# Patient Record
Sex: Female | Born: 1955
Health system: Southern US, Community
[De-identification: ages and names within clinical notes are randomized; demographics above are authoritative.]

## PROBLEM LIST (undated history)

## (undated) DIAGNOSIS — E785 Hyperlipidemia, unspecified: Secondary | ICD-10-CM

## (undated) HISTORY — DX: Hyperlipidemia, unspecified: E78.5

---

## 2005-03-22 ENCOUNTER — Ambulatory Visit: Payer: Self-pay

## 2005-10-16 ENCOUNTER — Ambulatory Visit: Payer: Self-pay | Admitting: Gastroenterology

## 2006-11-29 ENCOUNTER — Ambulatory Visit: Payer: Self-pay

## 2008-01-16 ENCOUNTER — Encounter: Payer: Self-pay | Admitting: Internal Medicine

## 2008-01-17 ENCOUNTER — Encounter: Payer: Self-pay | Admitting: Internal Medicine

## 2008-05-28 ENCOUNTER — Ambulatory Visit: Payer: Self-pay

## 2008-05-30 ENCOUNTER — Emergency Department: Payer: Self-pay | Admitting: Emergency Medicine

## 2009-07-14 ENCOUNTER — Ambulatory Visit: Payer: Self-pay | Admitting: Obstetrics and Gynecology

## 2010-07-26 ENCOUNTER — Ambulatory Visit: Payer: Self-pay | Admitting: Obstetrics and Gynecology

## 2010-09-28 ENCOUNTER — Ambulatory Visit: Payer: Self-pay | Admitting: Obstetrics and Gynecology

## 2011-08-01 ENCOUNTER — Ambulatory Visit: Payer: Self-pay | Admitting: Obstetrics and Gynecology

## 2012-08-01 ENCOUNTER — Ambulatory Visit: Payer: Self-pay | Admitting: Obstetrics and Gynecology

## 2012-09-13 ENCOUNTER — Ambulatory Visit: Payer: Self-pay | Admitting: Surgery

## 2013-07-23 ENCOUNTER — Ambulatory Visit: Payer: Self-pay | Admitting: Physician Assistant

## 2014-05-21 ENCOUNTER — Emergency Department: Payer: Self-pay | Admitting: Emergency Medicine

## 2014-11-24 ENCOUNTER — Ambulatory Visit: Payer: Self-pay | Admitting: Obstetrics and Gynecology

## 2014-11-25 ENCOUNTER — Ambulatory Visit: Payer: Self-pay | Admitting: Obstetrics and Gynecology

## 2015-01-04 ENCOUNTER — Encounter: Payer: Self-pay | Admitting: Family Medicine

## 2015-01-04 DIAGNOSIS — M7711 Lateral epicondylitis, right elbow: Secondary | ICD-10-CM

## 2015-01-04 DIAGNOSIS — M810 Age-related osteoporosis without current pathological fracture: Secondary | ICD-10-CM

## 2015-01-04 DIAGNOSIS — M2550 Pain in unspecified joint: Secondary | ICD-10-CM

## 2015-01-04 DIAGNOSIS — K219 Gastro-esophageal reflux disease without esophagitis: Secondary | ICD-10-CM

## 2015-01-04 DIAGNOSIS — B0229 Other postherpetic nervous system involvement: Secondary | ICD-10-CM

## 2015-01-04 DIAGNOSIS — M818 Other osteoporosis without current pathological fracture: Secondary | ICD-10-CM | POA: Insufficient documentation

## 2015-01-04 HISTORY — DX: Lateral epicondylitis, right elbow: M77.11

## 2015-01-08 NOTE — Op Note (Signed)
PATIENT NAME:  Christine Ray, Christine Ray MR#:  [id number] DATE OF BIRTH:  1956-04-13  DATE OF PROCEDURE:  09/13/2012  PREOPERATIVE DIAGNOSIS: Pilonidal cyst.   POSTOPERATIVE DIAGNOSIS: Pilonidal cyst.   PROCEDURE PERFORMED: Excision of pilonidal cyst.   SURGEON: Loreli Dollar, M.D.   ANESTHESIA: General.   INDICATIONS: This 59 year old female has a history of localized infection and drainage, left buttock near the gluteal fold. It has happened numerous times dating back many years.  It has been lanced and packed at least two times and did have some recent spontaneous drainage and identified palpable inflammatory mass just to the left of the gluteal crease and also identified a small pore in the skin adjacent to it in the midline and excision was recommended for definitive treatment.   DESCRIPTION OF PROCEDURE: The patient was brought into the Operating Room and placed under general endotracheal anesthesia and then rolled over onto the operating table in the prone position. The site of the cyst was prepared with Betadine solution and draped in a sterile manner.   A malleable probe was inserted into the midline pore and this went in just about 4 mm.  Next, a transversely oriented incision was made surrounding this pore and extending out to the palpable site of the cyst.  The incision was approximately 3 cm in length and was carried down through subcutaneous tissues, but did remove an ellipse of skin overlying the cyst extending over to the pore and dissection was carried down through subcutaneous tissues using electrocautery for hemostasis and the cyst was completely excised and submitted in formalin for routine pathology.         The wound was inspected. Several small bleeding points were cauterized. There was no remaining cyst identified. Next, the wound was infiltrated with 0.5% Sensorcaine with epinephrine. The wound was closed with interrupted 3-0 Monocryl inverted interrupted subcuticular  sutures and this did approximate the skin edges. Next, dressings were applied with Benzoin and paper tape. The patient tolerated the procedure satisfactorily and was then prepared for transfer to the recovery room.   ____________________________ Lenna Sciara. Rochel Brome, MD jws:eg D: 09/13/2012 13:07:55 ET T: 09/13/2012 22:35:42 ET JOB#: [id number]  cc: Loreli Dollar, MD, <Dictator> Loreli Dollar MD ELECTRONICALLY SIGNED 09/19/2012 16:07

## 2015-10-14 ENCOUNTER — Ambulatory Visit (INDEPENDENT_AMBULATORY_CARE_PROVIDER_SITE_OTHER): Payer: Managed Care, Other (non HMO) | Admitting: Family Medicine

## 2015-10-14 ENCOUNTER — Encounter: Payer: Self-pay | Admitting: Family Medicine

## 2015-10-14 VITALS — BP 116/78 | HR 98 | Temp 98.4°F | Resp 16 | Ht 70.0 in | Wt 203.8 lb

## 2015-10-14 DIAGNOSIS — R109 Unspecified abdominal pain: Secondary | ICD-10-CM

## 2015-10-14 DIAGNOSIS — N3090 Cystitis, unspecified without hematuria: Secondary | ICD-10-CM | POA: Diagnosis not present

## 2015-10-14 LAB — POCT URINALYSIS DIPSTICK
BILIRUBIN UA: NEGATIVE
Glucose, UA: NEGATIVE
KETONES UA: NEGATIVE
Nitrite, UA: POSITIVE
PH UA: 6
PROTEIN UA: NEGATIVE
SPEC GRAV UA: 1.01
Urobilinogen, UA: 0.2

## 2015-10-14 MED ORDER — SULFAMETHOXAZOLE-TRIMETHOPRIM 800-160 MG PO TABS: 1.0000 | ORAL_TABLET | Freq: Two times a day (BID) | ORAL | Status: DC

## 2015-10-14 NOTE — Progress Notes (Signed)
Date:  10/14/2015   Name:  Christine Ray   DOB:  Apr 04, 1956   MRN:  [id number]  PCP:  No primary care provider on file.    Chief Complaint: Abdominal Pain   History of Present Illness:  This is a 60 y.o. female with one week history of RLQ abdominal pain, worse since yesterday, dull/achy. Some urinary frequency. Still has appendix.  Review of Systems:  Review of Systems  Constitutional: Negative for fever and chills.  Gastrointestinal: Negative for nausea, vomiting, diarrhea and constipation.  Genitourinary: Negative for dysuria and flank pain.    Patient Active Problem List   Diagnosis Date Noted  . GERD (gastroesophageal reflux disease) 01/04/2015  . Osteoporosis 01/04/2015  . Postherpetic neuralgia 01/04/2015  . Diffuse arthralgia 01/04/2015  . Lateral epicondylitis of right elbow 01/04/2015    Prior to Admission medications   Medication Sig Start Date End Date Taking? Authorizing Provider  sulfamethoxazole-trimethoprim (BACTRIM DS,SEPTRA DS) 800-160 MG tablet Take 1 tablet by mouth 2 (two) times daily. 10/14/15   Adline Potter, MD    Allergies  Allergen Reactions  . Hydrocodone Other (See Comments)  . Oxycodone     Rash  . Oxycodone Hcl Rash    History reviewed. No pertinent past surgical history.  Social History  Substance Use Topics  . Smoking status: Former Smoker    Types: Cigarettes    Quit date: 10/14/1975  . Smokeless tobacco: None  . Alcohol Use: No    Family History  Problem Relation Age of Onset  . Prostate cancer Father     Medication list has been reviewed and updated.  Physical Examination: BP 116/78 mmHg  Pulse 98  Temp(Src) 98.4 F (36.9 C)  Resp 16  Ht 5\' 10"  (1.778 m)  Wt 203 lb 12.8 oz (92.443 kg)  BMI 29.24 kg/m2  Physical Exam  Constitutional: She appears well-developed and well-nourished. No distress.  Abdominal: Soft. Bowel sounds are normal. She exhibits no distension and no mass. There is no rebound and no guarding.   Mild RLQ and suprapubic tenderness  Neurological: She is alert.  Skin: Skin is warm and dry. She is not diaphoretic.  Psychiatric: She has a normal mood and affect. Her behavior is normal.  Nursing note and vitals reviewed.   Assessment and Plan:  1. Cystitis UA shows large blood, mod leuk, pos nitrite, Bactrim DS bid x 5d, cx sent   2. Abdominal pain, unspecified abdominal location Doubt appendicitis but consider ED eval if sxs worsen or fail to improve in 24 hrs - POCT urinalysis dipstick - CULTURE, URINE COMPREHENSIVE  Return if symptoms worsen or fail to improve.  Satira Anis. [facility]  10/14/2015

## 2015-10-16 LAB — CULTURE, URINE COMPREHENSIVE

## 2015-10-16 LAB — PLEASE NOTE

## 2016-04-19 ENCOUNTER — Other Ambulatory Visit: Payer: Self-pay

## 2016-04-20 ENCOUNTER — Ambulatory Visit: Payer: Managed Care, Other (non HMO) | Admitting: Family Medicine

## 2016-04-26 ENCOUNTER — Ambulatory Visit (INDEPENDENT_AMBULATORY_CARE_PROVIDER_SITE_OTHER): Payer: Managed Care, Other (non HMO) | Admitting: Internal Medicine

## 2016-04-26 ENCOUNTER — Encounter: Payer: Self-pay | Admitting: Internal Medicine

## 2016-04-26 VITALS — BP 122/90 | HR 71 | Temp 98.2°F | Resp 16 | Ht 70.0 in | Wt 194.8 lb

## 2016-04-26 DIAGNOSIS — E785 Hyperlipidemia, unspecified: Secondary | ICD-10-CM

## 2016-04-26 DIAGNOSIS — H8113 Benign paroxysmal vertigo, bilateral: Secondary | ICD-10-CM | POA: Diagnosis not present

## 2016-04-26 DIAGNOSIS — R350 Frequency of micturition: Secondary | ICD-10-CM | POA: Diagnosis not present

## 2016-04-26 DIAGNOSIS — E559 Vitamin D deficiency, unspecified: Secondary | ICD-10-CM

## 2016-04-26 DIAGNOSIS — H8110 Benign paroxysmal vertigo, unspecified ear: Secondary | ICD-10-CM | POA: Insufficient documentation

## 2016-04-26 DIAGNOSIS — I8390 Asymptomatic varicose veins of unspecified lower extremity: Secondary | ICD-10-CM

## 2016-04-26 DIAGNOSIS — E782 Mixed hyperlipidemia: Secondary | ICD-10-CM | POA: Insufficient documentation

## 2016-04-26 DIAGNOSIS — I868 Varicose veins of other specified sites: Secondary | ICD-10-CM

## 2016-04-26 DIAGNOSIS — I8393 Asymptomatic varicose veins of bilateral lower extremities: Secondary | ICD-10-CM | POA: Insufficient documentation

## 2016-04-26 LAB — POC URINALYSIS WITH MICROSCOPIC (NON AUTO)MANUAL RESULT
BACTERIA UA: 0
Bilirubin, UA: NEGATIVE
CRYSTALS: 0
EPITHELIAL CELLS, URINE PER MICROSCOPY: 0
Glucose, UA: NEGATIVE
Ketones, UA: NEGATIVE
Leukocytes, UA: NEGATIVE
MUCUS UA: 0
Nitrite, UA: NEGATIVE
PH UA: 5
RBC: 0 M/uL — AB (ref 4.04–5.48)
Spec Grav, UA: 1.02
WBC CASTS UA: 0

## 2016-04-26 NOTE — Patient Instructions (Signed)

## 2016-04-26 NOTE — Progress Notes (Signed)
Date:  04/26/2016   Name:  Christine Ray   DOB:  [birth year]   MRN:  [id number]   Chief Complaint: Abdominal Pain Glori Luis. urination and pain radiates to lower back on L side only. Denies Dysuria. Symptoms 1 week. ); Ear Drainage (2 weeks-Not sure if this was vertigo issue or from new med from Gyn for cholesterol. ); Foot Swelling (Left ankle selling x 2 weeks. ); Blurred Vision (Here and there not sure if this is also from cholesterol meds. ); and Hyperlipidemia (Wants Cholesterol Labs OBGYN Wrote for Atorvastatin)  Ear Drainage   There is pain in both ears. This is a new problem. The current episode started 1 to 4 weeks ago. Associated symptoms include ear discharge. Pertinent negatives include no hearing loss.  Urinary Frequency   This is a new problem. The problem occurs every urination. The problem has been unchanged. The patient is experiencing no pain. There has been no fever. Associated symptoms include frequency and urgency. Pertinent negatives include no hematuria. She has tried nothing for the symptoms.  Ankle Pain   The incident occurred more than 1 week ago. There was no injury mechanism. The pain is present in the left ankle (swelling). The patient is experiencing no pain.  Dizziness  This is a recurrent problem. The current episode started more than 1 year ago. The problem occurs rarely. The problem has been unchanged. Associated symptoms include arthralgias and joint swelling (ankle). Pertinent negatives include no chest pain. Nothing aggravates the symptoms. Treatments tried: meclizine.   Patient was started on Lipitor a month ago by her gynecologist. She took it for 1-2 weeks and felt that she was having side effects and discontinued it. Review review of labs reveal that she's been able to improve her cholesterol diet alone in the past and her 10 year risk is only 4%. She would like to stop medication and try control with diet.  Review of Systems  HENT: Positive for ear  discharge. Negative for ear pain and hearing loss.   Eyes: Positive for visual disturbance.  Respiratory: Negative for chest tightness, shortness of breath and wheezing.   Cardiovascular: Negative for chest pain, palpitations and leg swelling.  Genitourinary: Positive for frequency and urgency. Negative for difficulty urinating, dysuria and hematuria.  Musculoskeletal: Positive for arthralgias and joint swelling (ankle).  Neurological: Positive for dizziness.    Patient Active Problem List   Diagnosis Date Noted  . Hyperlipidemia 04/26/2016  . Vitamin D deficiency 04/26/2016  . GERD (gastroesophageal reflux disease) 01/04/2015  . Osteoporosis 01/04/2015  . Postherpetic neuralgia 01/04/2015  . Diffuse arthralgia 01/04/2015  . Lateral epicondylitis of right elbow 01/04/2015    Prior to Admission medications   Medication Sig Start Date End Date Taking? Authorizing Provider  Ascorbic Acid (VITAMIN C) 500 MG CAPS Take 1,000 mg by mouth.   Yes Historical Provider, MD  atorvastatin (LIPITOR) 10 MG tablet Take by mouth. 04/06/16 04/06/17 Yes Historical Provider, MD  Vitamin D, Ergocalciferol, (DRISDOL) 50000 units CAPS capsule Take by mouth. 10/15/15  Yes Historical Provider, MD    Allergies  Allergen Reactions  . Hydrocodone Other (See Comments)  . Oxycodone     Rash  . Oxycodone Hcl Rash    History reviewed. No pertinent surgical history.  Social History  Substance Use Topics  . Smoking status: Former Smoker    Types: Cigarettes    Quit date: 10/14/1975  . Smokeless tobacco: Never Used  . Alcohol use No  Medication list has been reviewed and updated.   Physical Exam  Constitutional: She is oriented to person, place, and time. She appears well-developed. No distress.  HENT:  Head: Normocephalic and atraumatic.  Right Ear: Tympanic membrane and ear canal normal.  Left Ear: Tympanic membrane and ear canal normal.  Nose: Right sinus exhibits no maxillary sinus tenderness  and no frontal sinus tenderness. Left sinus exhibits no maxillary sinus tenderness and no frontal sinus tenderness.  Mouth/Throat: No posterior oropharyngeal erythema.  Neck: Normal range of motion. Neck supple.  Cardiovascular: Normal rate, regular rhythm and normal heart sounds.   Prominent varicose veins of both lower legs and ankles  Pulmonary/Chest: Effort normal and breath sounds normal. No respiratory distress.  Abdominal: Soft. Bowel sounds are normal. There is no hepatosplenomegaly. There is tenderness in the left lower quadrant. There is no rebound and no CVA tenderness.  Musculoskeletal: Normal range of motion.  Lymphadenopathy:    She has no cervical adenopathy.  Neurological: She is alert and oriented to person, place, and time. She has normal reflexes.  Skin: Skin is warm and dry. No rash noted.  Psychiatric: She has a normal mood and affect. Her speech is normal and behavior is normal. Thought content normal.  Nursing note and vitals reviewed.   BP 122/90 (BP Location: Left Arm, Patient Position: Sitting, Cuff Size: Normal)   Pulse 71   Temp 98.2 F (36.8 C) (Oral)   Resp 16   Ht 5\' 10"  (1.778 m)   Wt 194 lb 12.8 oz (88.4 kg)   SpO2 99%   BMI 27.95 kg/m   Assessment and Plan: 1. Urinary frequency UA negative - if sx persist return - POC urinalysis w microscopic (non auto)  2. Hyperlipidemia Stop lipitor and begin Mediterranean Diet  3. Varicose veins Can consult Vein clinics for treatment if desired  4. BPPV (benign paroxysmal positional vertigo), bilateral Continue meclizine PRN - consider ENT evaluation if persistent  5. Vitamin D deficiency supplemented   Halina Maidens, MD [facility]  04/26/2016

## 2016-05-24 ENCOUNTER — Other Ambulatory Visit: Payer: Self-pay | Admitting: Obstetrics and Gynecology

## 2016-05-24 DIAGNOSIS — Z1231 Encounter for screening mammogram for malignant neoplasm of breast: Secondary | ICD-10-CM

## 2016-06-14 ENCOUNTER — Ambulatory Visit
Admission: RE | Admit: 2016-06-14 | Discharge: 2016-06-14 | Disposition: A | Discharge status: Home / Self Care | Payer: Managed Care, Other (non HMO) | Source: Ambulatory Visit | Attending: Obstetrics and Gynecology | Admitting: Obstetrics and Gynecology

## 2016-06-14 DIAGNOSIS — Z1231 Encounter for screening mammogram for malignant neoplasm of breast: Secondary | ICD-10-CM | POA: Diagnosis present

## 2016-06-14 DIAGNOSIS — R928 Other abnormal and inconclusive findings on diagnostic imaging of breast: Secondary | ICD-10-CM | POA: Insufficient documentation

## 2016-06-16 ENCOUNTER — Other Ambulatory Visit: Payer: Self-pay | Admitting: Obstetrics and Gynecology

## 2016-06-16 DIAGNOSIS — R928 Other abnormal and inconclusive findings on diagnostic imaging of breast: Secondary | ICD-10-CM

## 2016-06-16 DIAGNOSIS — N6489 Other specified disorders of breast: Secondary | ICD-10-CM

## 2016-06-30 ENCOUNTER — Ambulatory Visit
Admission: RE | Admit: 2016-06-30 | Discharge: 2016-06-30 | Disposition: A | Discharge status: Home / Self Care | Payer: Managed Care, Other (non HMO) | Source: Ambulatory Visit | Attending: Obstetrics and Gynecology | Admitting: Obstetrics and Gynecology

## 2016-06-30 DIAGNOSIS — N6489 Other specified disorders of breast: Secondary | ICD-10-CM

## 2016-06-30 DIAGNOSIS — R928 Other abnormal and inconclusive findings on diagnostic imaging of breast: Secondary | ICD-10-CM

## 2016-10-25 ENCOUNTER — Other Ambulatory Visit: Payer: Self-pay | Admitting: Internal Medicine

## 2016-10-27 ENCOUNTER — Ambulatory Visit (INDEPENDENT_AMBULATORY_CARE_PROVIDER_SITE_OTHER): Payer: Commercial Managed Care - PPO | Admitting: Internal Medicine

## 2016-10-27 ENCOUNTER — Encounter: Payer: Self-pay | Admitting: Internal Medicine

## 2016-10-27 VITALS — BP 124/78 | HR 80 | Temp 98.1°F | Resp 16 | Wt 187.0 lb

## 2016-10-27 DIAGNOSIS — E782 Mixed hyperlipidemia: Secondary | ICD-10-CM

## 2016-10-27 DIAGNOSIS — K219 Gastro-esophageal reflux disease without esophagitis: Secondary | ICD-10-CM

## 2016-10-27 DIAGNOSIS — E559 Vitamin D deficiency, unspecified: Secondary | ICD-10-CM

## 2016-10-27 DIAGNOSIS — M542 Cervicalgia: Secondary | ICD-10-CM

## 2016-10-27 DIAGNOSIS — H8113 Benign paroxysmal vertigo, bilateral: Secondary | ICD-10-CM

## 2016-10-27 DIAGNOSIS — Z23 Encounter for immunization: Secondary | ICD-10-CM

## 2016-10-27 NOTE — Patient Instructions (Signed)
Breast Self-Awareness Introduction Breast self-awareness means being familiar with how your breasts look and feel. It involves checking your breasts regularly and reporting any changes to your health care provider. Practicing breast self-awareness is important. A change in your breasts can be a sign of a serious medical problem. Being familiar with how your breasts look and feel allows you to find any problems early, when treatment is more likely to be successful. All women should practice breast self-awareness, including women who have had breast implants. How to do a breast self-exam One way to learn what is normal for your breasts and whether your breasts are changing is to do a breast self-exam. To do a breast self-exam: Look for Changes  1. Remove all the clothing above your waist. 2. Stand in front of a mirror in a room with good lighting. 3. Put your hands on your hips. 4. Push your hands firmly downward. 5. Compare your breasts in the mirror. Look for differences between them (asymmetry), such as:  Differences in shape.  Differences in size.  Puckers, dips, and bumps in one breast and not the other. 6. Look at each breast for changes in your skin, such as:  Redness.  Scaly areas. 7. Look for changes in your nipples, such as:  Discharge.  Bleeding.  Dimpling.  Redness.  A change in position. Feel for Changes  Carefully feel your breasts for lumps and changes. It is best to do this while lying on your back on the floor and again while sitting or standing in the shower or tub with soapy water on your skin. Feel each breast in the following way:  Place the arm on the side of the breast you are examining above your head.  Feel your breast with the other hand.  Start in the nipple area and make  inch (2 cm) overlapping circles to feel your breast. Use the pads of your three middle fingers to do this. Apply light pressure, then medium pressure, then firm pressure. The light  pressure will allow you to feel the tissue closest to the skin. The medium pressure will allow you to feel the tissue that is a little deeper. The firm pressure will allow you to feel the tissue close to the ribs.  Continue the overlapping circles, moving downward over the breast until you feel your ribs below your breast.  Move one finger-width toward the center of the body. Continue to use the  inch (2 cm) overlapping circles to feel your breast as you move slowly up toward your collarbone.  Continue the up and down exam using all three pressures until you reach your armpit. Write Down What You Find  Write down what is normal for each breast and any changes that you find. Keep a written record with breast changes or normal findings for each breast. By writing this information down, you do not need to depend only on memory for size, tenderness, or location. Write down where you are in your menstrual cycle, if you are still menstruating. If you are having trouble noticing differences in your breasts, do not get discouraged. With time you will become more familiar with the variations in your breasts and more comfortable with the exam. How often should I examine my breasts? Examine your breasts every month. If you are breastfeeding, the best time to examine your breasts is after a feeding or after using a breast pump. If you menstruate, the best time to examine your breasts is 5-7 days after your  period is over. During your period, your breasts are lumpier, and it may be more difficult to notice changes. When should I see my health care provider? See your health care provider if you notice:  A change in shape or size of your breasts or nipples.  A change in the skin of your breast or nipples, such as a reddened or scaly area.  Unusual discharge from your nipples.  A lump or thick area that was not there before.  Pain in your breasts.  Anything that concerns you. This information is not  intended to replace advice given to you by your health care provider. Make sure you discuss any questions you have with your health care provider. Document Released: 09/04/2005 Document Revised: 02/10/2016 Document Reviewed: 07/25/2015  2017 Elsevier

## 2016-10-27 NOTE — Progress Notes (Signed)
Date:  10/27/2016   Name:  Christine Ray   DOB:  07/03/1956   MRN:  [id number]  Previous patient of Dr. Vicente Masson.  She wants to continue to see me.  Her husband is my patient as well. Chief Complaint: Follow-up Vitamin D deficiency -  Taking low dose vitamin d.  Needs labs rechecked.  Gastroesophageal Reflux  She complains of heartburn. She reports no abdominal pain, no chest pain or no coughing. This is a recurrent problem. The problem occurs occasionally. The problem has been waxing and waning. The heartburn is of moderate intensity. Pertinent negatives include no fatigue. She has tried a PPI for the symptoms.  Hyperlipidemia  This is a chronic problem. Pertinent negatives include no chest pain or shortness of breath. She is currently on no antihyperlipidemic treatment (stopped lipitor 6 months).  Vertigo - stable, intermittent   Review of Systems  Constitutional: Negative for appetite change, fatigue, fever and unexpected weight change.  HENT: Negative for tinnitus and trouble swallowing.   Eyes: Negative for visual disturbance.  Respiratory: Negative for cough, chest tightness and shortness of breath.   Cardiovascular: Negative for chest pain, palpitations and leg swelling.  Gastrointestinal: Positive for heartburn. Negative for abdominal pain.  Endocrine: Negative for polydipsia and polyuria.  Genitourinary: Negative for dysuria and hematuria.  Musculoskeletal: Positive for neck stiffness. Negative for arthralgias.  Neurological: Negative for tremors, numbness and headaches.  Hematological: Negative for adenopathy.  Psychiatric/Behavioral: Negative for dysphoric mood and sleep disturbance.    Patient Active Problem List   Diagnosis Date Noted  . Hyperlipidemia 04/26/2016  . Vitamin D deficiency 04/26/2016  . BPPV (benign paroxysmal positional vertigo) 04/26/2016  . Varicose veins 04/26/2016  . GERD (gastroesophageal reflux disease) 01/04/2015  . Osteoporosis 01/04/2015  .  Postherpetic neuralgia 01/04/2015  . Diffuse arthralgia 01/04/2015  . Lateral epicondylitis of right elbow 01/04/2015    Prior to Admission medications   Medication Sig Start Date End Date Taking? Authorizing Provider  Ascorbic Acid (VITAMIN C) 500 MG CAPS Take 1,000 mg by mouth.   Yes Historical Provider, MD  solifenacin (VESICARE) 10 MG tablet Take 10 mg by mouth daily. 05/03/16 05/03/17 Yes Historical Provider, MD  Vitamin D, Cholecalciferol, 1000 units CAPS Take 1 capsule by mouth daily.   Yes Historical Provider, MD  Vitamin D, Ergocalciferol, (DRISDOL) 50000 units CAPS capsule Take by mouth. 10/15/15   Historical Provider, MD    Allergies  Allergen Reactions  . Hydrocodone Other (See Comments)  . Oxycodone     Rash  . Oxycodone Hcl Rash    No past surgical history on file.  Social History  Substance Use Topics  . Smoking status: Former Smoker    Types: Cigarettes    Quit date: 10/14/1975  . Smokeless tobacco: Never Used  . Alcohol use No     Medication list has been reviewed and updated.   Physical Exam  Constitutional: She is oriented to person, place, and time. She appears well-developed. No distress.  HENT:  Head: Normocephalic and atraumatic.  Neck: Normal range of motion. Neck supple.  Tender over C4-5 region soft tissues  Cardiovascular: Normal rate, regular rhythm and normal heart sounds.   Pulmonary/Chest: Effort normal and breath sounds normal. No respiratory distress.  Abdominal: Soft. Bowel sounds are normal. There is no tenderness. There is no rebound.  Musculoskeletal: Normal range of motion. She exhibits no edema.  Hyperpigmented area anterior shin, soft, minimally tender nodules < .5 cm from recent contusion  Neurological:  She is alert and oriented to person, place, and time.  Skin: Skin is warm and dry. No rash noted.  Psychiatric: She has a normal mood and affect. Her behavior is normal. Thought content normal.  Nursing note and vitals  reviewed.   BP 124/78 (BP Location: Left Arm, Patient Position: Sitting, Cuff Size: Normal)   Pulse 80   Temp 98.1 F (36.7 C)   Resp 16   Wt 187 lb (84.8 kg)   BMI 26.83 kg/m   Assessment and Plan: 1. Gastroesophageal reflux disease, esophagitis presence not specified Mild, intermittent - continue omeprazole PRN - CBC with Differential/Platelet  2. Mixed hyperlipidemia Will advise if medication is needed - Lipid panel  3. Vitamin D deficiency Will advise on higher dose supplement - VITAMIN D 25 Hydroxy (Vit-D Deficiency, Fractures)  4. Benign paroxysmal positional vertigo due to bilateral vestibular disorder stable - TSH - Comprehensive metabolic panel  5. Need for influenza vaccination - Flu Vaccine QUAD 36+ mos PF IM (Fluarix & Fluzone Quad PF)  6. Posterior neck pain Recommend tylenol and heat as needed   Halina Maidens, MD [facility]  10/27/2016

## 2016-10-28 LAB — LIPID PANEL
Chol/HDL Ratio: 4.1 ratio units (ref 0.0–4.4)
Cholesterol, Total: 228 mg/dL — ABNORMAL HIGH (ref 100–199)
HDL: 55 mg/dL (ref >39)
LDL Calculated: 159 mg/dL — ABNORMAL HIGH (ref 0–99)
Triglycerides: 70 mg/dL (ref 0–149)
VLDL CHOLESTEROL CAL: 14 mg/dL (ref 5–40)

## 2016-10-28 LAB — CBC WITH DIFFERENTIAL/PLATELET
BASOS ABS: 0 10*3/uL (ref 0.0–0.2)
BASOS: 0 %
EOS (ABSOLUTE): 0.1 10*3/uL (ref 0.0–0.4)
Eos: 2 %
Hematocrit: 41.5 % (ref 34.0–46.6)
Hemoglobin: 13.7 g/dL (ref 11.1–15.9)
Immature Grans (Abs): 0 10*3/uL (ref 0.0–0.1)
Immature Granulocytes: 0 %
LYMPHS ABS: 2.2 10*3/uL (ref 0.7–3.1)
Lymphs: 42 %
MCH: 27.3 pg (ref 26.6–33.0)
MCHC: 33 g/dL (ref 31.5–35.7)
MCV: 83 fL (ref 79–97)
MONOS ABS: 0.5 10*3/uL (ref 0.1–0.9)
Monocytes: 9 %
NEUTROS ABS: 2.5 10*3/uL (ref 1.4–7.0)
Neutrophils: 47 %
PLATELETS: 166 10*3/uL (ref 150–379)
RBC: 5.02 x10E6/uL (ref 3.77–5.28)
RDW: 15.3 % (ref 12.3–15.4)
WBC: 5.3 10*3/uL (ref 3.4–10.8)

## 2016-10-28 LAB — COMPREHENSIVE METABOLIC PANEL
A/G RATIO: 1.5 (ref 1.2–2.2)
ALT: 11 IU/L (ref 0–32)
AST: 14 IU/L (ref 0–40)
Albumin: 4.3 g/dL (ref 3.6–4.8)
Alkaline Phosphatase: 83 IU/L (ref 39–117)
BUN/Creatinine Ratio: 20 (ref 12–28)
BUN: 16 mg/dL (ref 8–27)
Bilirubin Total: 0.4 mg/dL (ref 0.0–1.2)
CALCIUM: 9.6 mg/dL (ref 8.7–10.3)
CHLORIDE: 101 mmol/L (ref 96–106)
CO2: 28 mmol/L (ref 18–29)
Creatinine, Ser: 0.82 mg/dL (ref 0.57–1.00)
GFR, EST AFRICAN AMERICAN: 89 mL/min/{1.73_m2} (ref >59)
GFR, EST NON AFRICAN AMERICAN: 77 mL/min/{1.73_m2} (ref >59)
GLOBULIN, TOTAL: 2.8 g/dL (ref 1.5–4.5)
Glucose: 63 mg/dL — ABNORMAL LOW (ref 65–99)
POTASSIUM: 4.4 mmol/L (ref 3.5–5.2)
SODIUM: 145 mmol/L — AB (ref 134–144)
TOTAL PROTEIN: 7.1 g/dL (ref 6.0–8.5)

## 2016-10-28 LAB — TSH: TSH: 1.58 u[IU]/mL (ref 0.450–4.500)

## 2016-10-28 LAB — VITAMIN D 25 HYDROXY (VIT D DEFICIENCY, FRACTURES): VIT D 25 HYDROXY: 29.2 ng/mL — AB (ref 30.0–100.0)

## 2016-11-02 ENCOUNTER — Telehealth: Payer: Self-pay

## 2016-11-02 NOTE — Telephone Encounter (Signed)
Pt called asking about lab results, and when [facility]ia wanted to recheck. Told pt to be have a recheck in a year. Explained to her the results, and that cholesterol is only borderline high.

## 2016-11-10 ENCOUNTER — Encounter: Payer: Self-pay | Admitting: *Deleted

## 2016-11-13 ENCOUNTER — Encounter: Payer: Self-pay | Admitting: Anesthesiology

## 2016-11-13 ENCOUNTER — Encounter
Admission: RE | Disposition: A | Discharge status: Home / Self Care | Payer: Self-pay | Source: Ambulatory Visit | Attending: Gastroenterology

## 2016-11-13 ENCOUNTER — Ambulatory Visit: Payer: Commercial Managed Care - PPO | Admitting: Anesthesiology

## 2016-11-13 ENCOUNTER — Ambulatory Visit
Admission: RE | Admit: 2016-11-13 | Discharge: 2016-11-13 | Disposition: A | Discharge status: Home / Self Care | Payer: Commercial Managed Care - PPO | Source: Ambulatory Visit | Attending: Gastroenterology | Admitting: Gastroenterology

## 2016-11-13 DIAGNOSIS — K640 First degree hemorrhoids: Secondary | ICD-10-CM | POA: Diagnosis not present

## 2016-11-13 DIAGNOSIS — Z1211 Encounter for screening for malignant neoplasm of colon: Secondary | ICD-10-CM | POA: Diagnosis present

## 2016-11-13 DIAGNOSIS — Z79899 Other long term (current) drug therapy: Secondary | ICD-10-CM | POA: Diagnosis not present

## 2016-11-13 DIAGNOSIS — D124 Benign neoplasm of descending colon: Secondary | ICD-10-CM | POA: Insufficient documentation

## 2016-11-13 DIAGNOSIS — K621 Rectal polyp: Secondary | ICD-10-CM | POA: Insufficient documentation

## 2016-11-13 DIAGNOSIS — G709 Myoneural disorder, unspecified: Secondary | ICD-10-CM | POA: Diagnosis not present

## 2016-11-13 DIAGNOSIS — Z8371 Family history of colonic polyps: Secondary | ICD-10-CM | POA: Diagnosis not present

## 2016-11-13 DIAGNOSIS — Z87891 Personal history of nicotine dependence: Secondary | ICD-10-CM | POA: Insufficient documentation

## 2016-11-13 DIAGNOSIS — K219 Gastro-esophageal reflux disease without esophagitis: Secondary | ICD-10-CM | POA: Diagnosis not present

## 2016-11-13 DIAGNOSIS — K6389 Other specified diseases of intestine: Secondary | ICD-10-CM | POA: Diagnosis not present

## 2016-11-13 DIAGNOSIS — E785 Hyperlipidemia, unspecified: Secondary | ICD-10-CM | POA: Diagnosis not present

## 2016-11-13 DIAGNOSIS — M1990 Unspecified osteoarthritis, unspecified site: Secondary | ICD-10-CM | POA: Insufficient documentation

## 2016-11-13 HISTORY — PX: COLONOSCOPY WITH PROPOFOL: SHX5780

## 2016-11-13 SURGERY — COLONOSCOPY WITH PROPOFOL
Anesthesia: General

## 2016-11-13 MED ORDER — LIDOCAINE HCL (PF) 2 % IJ SOLN
INTRAMUSCULAR | Status: AC
  Filled 2016-11-13: qty 2

## 2016-11-13 MED ORDER — MIDAZOLAM HCL 2 MG/2ML IJ SOLN
INTRAMUSCULAR | Status: AC
  Filled 2016-11-13: qty 2

## 2016-11-13 MED ORDER — SODIUM CHLORIDE 0.9 % IV SOLN
INTRAVENOUS | Status: DC
  Administered 2016-11-13: 1000 mL via INTRAVENOUS
  Administered 2016-11-13: 10:00:00 via INTRAVENOUS

## 2016-11-13 MED ORDER — FENTANYL CITRATE (PF) 100 MCG/2ML IJ SOLN
INTRAMUSCULAR | Status: AC
  Filled 2016-11-13: qty 2

## 2016-11-13 MED ORDER — PROPOFOL 500 MG/50ML IV EMUL
INTRAVENOUS | Status: DC | PRN
  Administered 2016-11-13: 160 ug/kg/min via INTRAVENOUS

## 2016-11-13 MED ORDER — PROPOFOL 10 MG/ML IV BOLUS
INTRAVENOUS | Status: DC | PRN
  Administered 2016-11-13: 40 mg via INTRAVENOUS

## 2016-11-13 MED ORDER — FENTANYL CITRATE (PF) 100 MCG/2ML IJ SOLN
INTRAMUSCULAR | Status: DC | PRN
  Administered 2016-11-13: 50 ug via INTRAVENOUS

## 2016-11-13 MED ORDER — PROPOFOL 500 MG/50ML IV EMUL
INTRAVENOUS | Status: AC
  Filled 2016-11-13: qty 50

## 2016-11-13 MED ORDER — MIDAZOLAM HCL 2 MG/2ML IJ SOLN
INTRAMUSCULAR | Status: DC | PRN
  Administered 2016-11-13: 2 mg via INTRAVENOUS

## 2016-11-13 MED ORDER — SODIUM CHLORIDE 0.9 % IV SOLN: INTRAVENOUS | Status: DC

## 2016-11-13 NOTE — Anesthesia Procedure Notes (Signed)
Date/Time: 11/13/2016 10:10 AM Performed by: Allean Found Pre-anesthesia Checklist: Patient identified, Emergency Drugs available, Suction available, Patient being monitored and Timeout performed Patient Re-evaluated:Patient Re-evaluated prior to inductionOxygen Delivery Method: Nasal cannula Intubation Type: IV induction Placement Confirmation: positive ETCO2

## 2016-11-13 NOTE — Transfer of Care (Signed)
Immediate Anesthesia Transfer of Care Note  Patient: Christine Ray  Procedure(s) Performed: Procedure(s): COLONOSCOPY WITH PROPOFOL (N/A)  Patient Location: PACU  Anesthesia Type:General  Level of Consciousness: sedated  Airway & Oxygen Therapy: Patient Spontanous Breathing and Patient connected to nasal cannula oxygen  Post-op Assessment: Report given to RN and Post -op Vital signs reviewed and stable  Post vital signs: Reviewed and stable  Last Vitals:  Vitals:   11/13/16 0857 11/13/16 1056  BP: 124/65 (!) 95/58  Pulse: 75 69  Resp: 16 13  Temp: (!) 35.9 C 36.3 C    Last Pain:  Vitals:   11/13/16 1056  TempSrc: Tympanic         Complications: No apparent anesthesia complications

## 2016-11-13 NOTE — Anesthesia Postprocedure Evaluation (Signed)
Anesthesia Post Note  Patient: Christine Ray  Procedure(s) Performed: Procedure(s) (LRB): COLONOSCOPY WITH PROPOFOL (N/A)  Patient location during evaluation: Endoscopy Anesthesia Type: General Level of consciousness: awake and alert Pain management: pain level controlled Vital Signs Assessment: post-procedure vital signs reviewed and stable Respiratory status: spontaneous breathing, nonlabored ventilation, respiratory function stable and patient connected to nasal cannula oxygen Cardiovascular status: blood pressure returned to baseline and stable Postop Assessment: no signs of nausea or vomiting Anesthetic complications: no     Last Vitals:  Vitals:   11/13/16 1116 11/13/16 1126  BP: 103/72 118/79  Pulse: 61 63  Resp: 14 16  Temp:      Last Pain:  Vitals:   11/13/16 1056  TempSrc: Tympanic                 [name] [name]

## 2016-11-13 NOTE — Op Note (Signed)
[facility] Patient Name: Christine Ray Procedure Date: 11/13/2016 10:07 AM MRN: [id number] Account #: [id number] Date of Birth: Dec 07, 1955 Admit Type: Outpatient Age: 61 Room: [facility] ENDO ROOM 3 Gender: Female Note Status: Finalized Procedure:            Colonoscopy Indications:          Screening for colorectal malignant neoplasm Providers:            Lollie Sails, MD Referring MD:         Boykin Nearing, MD (Referring MD) Medicines:            Monitored Anesthesia Care Complications:        No immediate complications. Procedure:            Pre-Anesthesia Assessment:                       - ASA Grade Assessment: III - A patient with severe                        systemic disease.                       After obtaining informed consent, the colonoscope was                        passed under direct vision. Throughout the procedure,                        the patient's blood pressure, pulse, and oxygen                        saturations were monitored continuously. The                        Colonoscope was introduced through the anus and                        advanced to the the cecum, identified by appendiceal                        orifice and ileocecal valve. The colonoscopy was                        performed without difficulty. The patient tolerated the                        procedure well. The quality of the bowel preparation                        was good. The quality of the bowel preparation was good. Findings:      A 3 mm polyp was found in the rectum. The polyp was sessile. The polyp       was removed with a cold biopsy forceps. Resection and retrieval were       complete.      A 7 mm polyp was found in the descending colon. The polyp was       pedunculated. The polyp was removed with a hot snare. Resection and       retrieval were complete.      A 3 mm polyp was found in the splenic  flexure. The polyp was sessile.      The polyp was removed with a cold biopsy forceps. Resection and       retrieval were complete.      A 2 mm polyp was found in the sigmoid colon. The polyp was sessile. The       polyp was removed with a cold biopsy forceps. Resection and retrieval       were complete.      The digital rectal exam was normal.      Non-bleeding internal hemorrhoids were found during anoscopy. The       hemorrhoids were Grade I (internal hemorrhoids that do not prolapse). Impression:           - One 3 mm polyp in the rectum, removed with a cold                        biopsy forceps. Resected and retrieved.                       - One 7 mm polyp in the descending colon, removed with                        a hot snare. Resected and retrieved.                       - One 3 mm polyp at the splenic flexure, removed with a                        cold biopsy forceps. Resected and retrieved.                       - One 2 mm polyp in the sigmoid colon, removed with a                        cold biopsy forceps. Resected and retrieved.                       - Non-bleeding internal hemorrhoids. Recommendation:       - Await pathology results.                       - Telephone GI clinic for pathology results in 1 week. Procedure Code(s):    --- Professional ---                       [id number], Colonoscopy, flexible; with removal of tumor(s),                        polyp(s), or other lesion(s) by snare technique                       45380, 19, Colonoscopy, flexible; with biopsy, single                        or multiple Diagnosis Code(s):    --- Professional ---                       Z12.11, Encounter for screening for malignant neoplasm  of colon                       K62.1, Rectal polyp                       D12.4, Benign neoplasm of descending colon                       D12.3, Benign neoplasm of transverse colon (hepatic                        flexure or splenic flexure)                        D12.5, Benign neoplasm of sigmoid colon                       K64.0, First degree hemorrhoids CPT copyright 2016 American Medical Association. All rights reserved. The codes documented in this report are preliminary and upon coder review may  be revised to meet current compliance requirements. Lollie Sails, MD 11/13/2016 10:52:34 AM This report has been signed electronically. Number of Addenda: 0 Note Initiated On: 11/13/2016 10:07 AM Scope Withdrawal Time: 0 hours 8 minutes 15 seconds  Total Procedure Duration: 0 hours 25 minutes 10 seconds       [facility]

## 2016-11-13 NOTE — Anesthesia Preprocedure Evaluation (Signed)
Anesthesia Evaluation  Patient identified by MRN, date of birth, ID band Patient awake    Reviewed: Allergy & Precautions, H&P , NPO status , Patient's Chart, lab work & pertinent test results  History of Anesthesia Complications Negative for: history of anesthetic complications  Airway Mallampati: II  TM Distance: >3 FB Neck ROM: full    Dental  (+) Poor Dentition, Chipped, Missing   Pulmonary neg pulmonary ROS, neg shortness of breath, former smoker,    Pulmonary exam normal breath sounds clear to auscultation       Cardiovascular Exercise Tolerance: Good (-) angina(-) Past MI and (-) DOE negative cardio ROS Normal cardiovascular exam Rhythm:regular Rate:Normal     Neuro/Psych  Neuromuscular disease negative psych ROS   GI/Hepatic Neg liver ROS, GERD  Controlled,  Endo/Other  negative endocrine ROS  Renal/GU negative Renal ROS  negative genitourinary   Musculoskeletal  (+) Arthritis ,   Abdominal   Peds  Hematology negative hematology ROS (+)   Anesthesia Other Findings Signs and symptoms suggestive of sleep apnea   Past Medical History: No date: Hyperlipidemia  History reviewed. No pertinent surgical history.  BMI    Body Mass Index:  26.69 kg/m      Reproductive/Obstetrics negative OB ROS                             Anesthesia Physical Anesthesia Plan  ASA: III  Anesthesia Plan: General   Post-op Pain Management:    Induction:   Airway Management Planned:   Additional Equipment:   Intra-op Plan:   Post-operative Plan:   Informed Consent: I have reviewed the patients History and Physical, chart, labs and discussed the procedure including the risks, benefits and alternatives for the proposed anesthesia with the patient or authorized representative who has indicated his/her understanding and acceptance.   Dental Advisory Given  Plan Discussed with:  Anesthesiologist, CRNA and Surgeon  Anesthesia Plan Comments:         Anesthesia Quick Evaluation

## 2016-11-13 NOTE — Anesthesia Post-op Follow-up Note (Cosign Needed)
Anesthesia QCDR form completed.        

## 2016-11-13 NOTE — H&P (Signed)
Outpatient short stay form Pre-procedure 11/13/2016 10:09 AM Lollie Sails MD  Primary Physician: Dr. Laverta Baltimore  Reason for visit:  Screening colonoscopy  History of present illness:  Patient is a 61 year old female presenting today as above. She has a family history of colon polyps and multiple primary relatives. She tolerated her prep well. She takes no aspirin or blood thinning agents.    Current Facility-Administered Medications:  .  0.9 %  sodium chloride infusion, , Intravenous, Continuous, Lollie Sails, MD, Last Rate: 20 mL/hr at 11/13/16 0915, 1,000 mL at 11/13/16 0915 .  0.9 %  sodium chloride infusion, , Intravenous, Continuous, Lollie Sails, MD  Prescriptions Prior to Admission  Medication Sig Dispense Refill Last Dose  . Ascorbic Acid (VITAMIN C) 500 MG CAPS Take 1,000 mg by mouth.   Past Week at Unknown time  . atorvastatin (LIPITOR) 10 MG tablet Take 10 mg by mouth daily.   Past Week at Unknown time  . solifenacin (VESICARE) 10 MG tablet Take 10 mg by mouth daily.   Past Week at Unknown time  . Vitamin D, Cholecalciferol, 1000 units CAPS Take 1 capsule by mouth daily.   Past Week at Unknown time  . Vitamin D, Ergocalciferol, (DRISDOL) 50000 units CAPS capsule Take by mouth.   Past Week at Unknown time     Allergies  Allergen Reactions  . Hydrocodone Other (See Comments)  . Oxycodone     Rash  . Oxycodone Hcl Rash     Past Medical History:  Diagnosis Date  . Hyperlipidemia     Review of systems:      Physical Exam    Heart and lungs: Regular rate and rhythm without rub or gallop, lungs are bilaterally clear.    HEENT: Normocephalic atraumatic eyes are anicteric    Other:     Pertinant exam for procedure: Soft nontender nondistended bowel sounds positive normoactive.    Planned proceedures: Colonoscopy and indicated procedures. I have discussed the risks benefits and complications of procedures to include not limited to  bleeding, infection, perforation and the risk of sedation and the patient wishes to proceed.    Lollie Sails, MD Gastroenterology 11/13/2016  10:09 AM

## 2016-11-14 ENCOUNTER — Encounter: Payer: Self-pay | Admitting: Gastroenterology

## 2016-11-15 LAB — SURGICAL PATHOLOGY

## 2017-01-24 ENCOUNTER — Encounter: Payer: Self-pay | Admitting: *Deleted

## 2017-01-25 ENCOUNTER — Encounter: Payer: Self-pay | Admitting: *Deleted

## 2017-01-29 ENCOUNTER — Ambulatory Visit (INDEPENDENT_AMBULATORY_CARE_PROVIDER_SITE_OTHER): Payer: Commercial Managed Care - PPO | Admitting: General Surgery

## 2017-01-29 ENCOUNTER — Encounter: Payer: Self-pay | Admitting: General Surgery

## 2017-01-29 VITALS — BP 132/76 | HR 76 | Resp 12 | Ht 71.0 in | Wt 187.0 lb

## 2017-01-29 DIAGNOSIS — I83813 Varicose veins of bilateral lower extremities with pain: Secondary | ICD-10-CM

## 2017-01-29 NOTE — Progress Notes (Signed)
Patient ID: Christine Ray, female   DOB: [birth year], 61 y.o.   MRN: [id number]  Chief Complaint  Patient presents with  . Other    Varicose Veins    HPI Christine Ray is a 61 y.o. female is here today for an evaluation for varicose veins. She states she has this problem for awhile. She states her left foot and leg aches more. Positive for leg swelling. She describes legs feeling achy and heavy usually at day's end. Mild selling associated. HPI  Past Medical History:  Diagnosis Date  . Hyperlipidemia     Past Surgical History:  Procedure Laterality Date  . COLONOSCOPY WITH PROPOFOL N/A 11/13/2016   Procedure: COLONOSCOPY WITH PROPOFOL;  Surgeon: Lollie Sails, MD;  Location: [facility] ENDOSCOPY;  Service: Endoscopy;  Laterality: N/A;    Family History  Problem Relation Age of Onset  . Breast cancer Mother 8  . Prostate cancer Father     Social History Social History  Substance Use Topics  . Smoking status: Former Smoker    Types: Cigarettes    Quit date: 10/14/1975  . Smokeless tobacco: Never Used  . Alcohol use No    Allergies  Allergen Reactions  . Hydrocodone Other (See Comments)  . Oxycodone     Rash  . Oxycodone Hcl Rash    Current Outpatient Prescriptions  Medication Sig Dispense Refill  . Ascorbic Acid (VITAMIN C) 500 MG CAPS Take 1,000 mg by mouth.    . meclizine (ANTIVERT) 25 MG tablet meclizine 25 mg tablet  TAKE ONE TABLET THREE TIMES DAILY AS NEEDED FOR DIZZINESS    . solifenacin (VESICARE) 10 MG tablet Take 10 mg by mouth daily.    . Vitamin D, Ergocalciferol, (DRISDOL) 50000 units CAPS capsule Take by mouth.     No current facility-administered medications for this visit.     Review of Systems Review of Systems  Constitutional: Negative.   Respiratory: Negative.   Cardiovascular: Negative.     Blood pressure 132/76, pulse 76, resp. rate 12, height 5\' 11"  (1.803 m), weight 187 lb (84.8 kg).  Physical Exam Physical Exam   Constitutional: She is oriented to person, place, and time. She appears well-developed and well-nourished.  Eyes: Conjunctivae are normal. No scleral icterus.  Cardiovascular: Normal rate, regular rhythm and normal heart sounds.   Pulses:      Dorsalis pedis pulses are 2+ on the right side, and 2+ on the left side.       Posterior tibial pulses are 2+ on the right side, and 2+ on the left side.  VV noted in feet and left calf area. Spotty skin discoloration in lower legs , no induration or tenderness. Feet are warm, capillary refill brisk. No edema at present  Pulmonary/Chest: Effort normal and breath sounds normal.  Neurological: She is alert and oriented to person, place, and time.  Skin: Skin is warm and dry.  varicosities more prominient in left leg     Data Reviewed Referring notes  Assessment    Varicose veins, symptomatic.    Plan   Discussed findings with pt fully.     Recommend wear compression stockings daily. Rx given for stockings with ankle pr of 20-30 mm hg Follow up in 6 weeks for reassessment and Venous Duplex study  HPI, Physical Exam, Assessment and Plan have been scribed under the direction and in the presence of Mckinley Jewel, MD.  Verlene Mayer, CMA  I have completed the exam and reviewed the above  documentation for accuracy and completeness.  I agree with the above.  Haematologist has been used and any errors in dictation or transcription are unintentional.  Seeplaputhur G. Jamal Collin, M.D., F.A.C.S.          Jobe Mutch 01/29/2017, 11:11 AM

## 2017-01-29 NOTE — Patient Instructions (Addendum)
Recommend to wear compression stockings. Put stockings on while laying down. Wear stockings daily.   Follow up in 6 weeks with office visit and ultrasound.  Varicose Veins Varicose veins are veins that have become enlarged and twisted. They are usually seen in the legs but can occur in other parts of the body as well. What are the causes? This condition is the result of valves in the veins not working properly. Valves in the veins help to return blood from the leg to the heart. If these valves are damaged, blood flows backward and backs up into the veins in the leg near the skin. This causes the veins to become larger. What increases the risk? People who are on their feet a lot, who are pregnant, or who are overweight are more likely to develop varicose veins. What are the signs or symptoms?  Bulging, twisted-appearing, bluish veins, most commonly found on the legs.  Leg pain or a feeling of heaviness. These symptoms may be worse at the end of the day.  Leg swelling.  Changes in skin color. How is this diagnosed? A health care provider can usually diagnose varicose veins by examining your legs. Your health care provider may also recommend an ultrasound of your leg veins. How is this treated? Most varicose veins can be treated at home.However, other treatments are available for people who have persistent symptoms or want to improve the cosmetic appearance of the varicose veins. These treatment options include:  Sclerotherapy. A solution is injected into the vein to close it off.  Laser treatment. A laser is used to heat the vein to close it off.  Radiofrequency vein ablation. An electrical current produced by radio waves is used to close off the vein.  Phlebectomy. The vein is surgically removed through small incisions made over the varicose vein.  Vein ligation and stripping. The vein is surgically removed through incisions made over the varicose vein after the vein has been tied  (ligated). Follow these instructions at home:   Do not stand or sit in one position for long periods of time. Do not sit with your legs crossed. Rest with your legs raised during the day.  Wear compression stockings as directed by your health care provider. These stockings help to prevent blood clots and reduce swelling in your legs.  Do not wear other tight, encircling garments around your legs, pelvis, or waist.  Walk as much as possible to increase blood flow.  Raise the foot of your bed at night with 2-inch blocks.  If you get a cut in the skin over the vein and the vein bleeds, lie down with your leg raised and press on it with a clean cloth until the bleeding stops. Then place a bandage (dressing) on the cut. See your health care provider if it continues to bleed. Contact a health care provider if:  The skin around your ankle starts to break down.  You have pain, redness, tenderness, or hard swelling in your leg over a vein.  You are uncomfortable because of leg pain. This information is not intended to replace advice given to you by your health care provider. Make sure you discuss any questions you have with your health care provider. Document Released: 06/14/2005 Document Revised: 02/10/2016 Document Reviewed: 03/07/2016 Elsevier Interactive Patient Education  2017 [employer].

## 2017-03-12 ENCOUNTER — Encounter: Payer: Self-pay | Admitting: General Surgery

## 2017-03-12 ENCOUNTER — Other Ambulatory Visit: Payer: Self-pay

## 2017-03-12 ENCOUNTER — Ambulatory Visit (INDEPENDENT_AMBULATORY_CARE_PROVIDER_SITE_OTHER): Payer: Commercial Managed Care - PPO | Admitting: General Surgery

## 2017-03-12 VITALS — BP 128/68 | HR 74 | Resp 12 | Ht 70.0 in | Wt 187.0 lb

## 2017-03-12 DIAGNOSIS — I83812 Varicose veins of left lower extremities with pain: Secondary | ICD-10-CM

## 2017-03-12 DIAGNOSIS — I83813 Varicose veins of bilateral lower extremities with pain: Secondary | ICD-10-CM | POA: Diagnosis not present

## 2017-03-12 NOTE — Progress Notes (Signed)
Patient ID: Christine Ray, female   DOB: May 08, 1956, 61 y.o.   MRN: [id number]  Chief Complaint  Patient presents with  . Follow-up    6 week varicose vein     HPI Christine Ray is a 61 y.o. female is her today for a 6 week varicose vein follow up and ultrasound. She has been wearing her compression stockings daily. She says her legs still ache.When she wears her stockings discomfort is less but not fully resolved.  Her husband had a heart attack on May 27, he is doing well.  HPI  Past Medical History:  Diagnosis Date  . Hyperlipidemia     Past Surgical History:  Procedure Laterality Date  . COLONOSCOPY WITH PROPOFOL N/A 11/13/2016   Procedure: COLONOSCOPY WITH PROPOFOL;  Surgeon: Lollie Sails, MD;  Location: [facility] ENDOSCOPY;  Service: Endoscopy;  Laterality: N/A;    Family History  Problem Relation Age of Onset  . Breast cancer Mother 32  . Prostate cancer Father     Social History Social History  Substance Use Topics  . Smoking status: Former Smoker    Types: Cigarettes    Quit date: 10/14/1975  . Smokeless tobacco: Never Used  . Alcohol use No    Allergies  Allergen Reactions  . Hydrocodone Other (See Comments)  . Oxycodone     Rash  . Oxycodone Hcl Rash    Current Outpatient Prescriptions  Medication Sig Dispense Refill  . Ascorbic Acid (VITAMIN C) 500 MG CAPS Take 1,000 mg by mouth.    . solifenacin (VESICARE) 10 MG tablet Take 10 mg by mouth daily.    . Vitamin D, Ergocalciferol, (DRISDOL) 50000 units CAPS capsule Take by mouth.     No current facility-administered medications for this visit.     Review of Systems Review of Systems  Constitutional: Negative.   Respiratory: Negative.   Cardiovascular: Negative.     Blood pressure 128/68, pulse 74, resp. rate 12, height 5\' 10"  (1.778 m), weight 187 lb (84.8 kg).  Physical Exam Physical Exam  Constitutional: She is oriented to person, place, and time. She appears well-developed and  well-nourished.  Eyes: Conjunctivae are normal. No scleral icterus.  Cardiovascular: Normal rate, regular rhythm and intact distal pulses.   VV few on right leg, more prominent left medial calf and top of left foot  Neurological: She is alert and oriented to person, place, and time.  Skin: Skin is warm and dry.  Psychiatric: She has a normal mood and affect. Her behavior is normal.    Data Reviewed Prior notes reviewed. Duplex scan- No DVT. There appears to be abnormal reflux in the left GSV near the knee area-VV connect at this site. GSV in thigh and SFJ with normal doppler. LSV is very small and normal. Possible perforator vein in lower third of left leg Assessment    Varicose veins- Left side more prominent- in calf medially and top of foot-is symptomatic.     Plan    Continue wearing compression stockings daily. Reassess in the Fall and discuss plan of action regarding intervention.     HPI, Physical Exam, Assessment and Plan have been scribed under the direction and in the presence of Mckinley Jewel, MD.  Verlene Mayer, CMA  I have completed the exam and reviewed the above documentation for accuracy and completeness.  I agree with the above.  Haematologist has been used and any errors in dictation or transcription are unintentional.  Hovanes Hymas G. Jamal Collin, M.D.,  F.A.C.Patrick Jupiter 03/12/2017, 10:11 AM

## 2017-03-12 NOTE — Patient Instructions (Signed)
  Continue wearing compression stockings daily. Reassess in the Fall and discuss plan of action regarding intervention.

## 2017-03-27 ENCOUNTER — Emergency Department (HOSPITAL_COMMUNITY): Payer: Commercial Managed Care - PPO

## 2017-03-27 ENCOUNTER — Encounter (HOSPITAL_COMMUNITY): Payer: Self-pay | Admitting: Emergency Medicine

## 2017-03-27 ENCOUNTER — Emergency Department (HOSPITAL_COMMUNITY)
Admission: EM | Admit: 2017-03-27 | Discharge: 2017-03-27 | Disposition: A | Discharge status: Home / Self Care | Payer: Commercial Managed Care - PPO | Attending: Emergency Medicine | Admitting: Emergency Medicine

## 2017-03-27 DIAGNOSIS — Z7982 Long term (current) use of aspirin: Secondary | ICD-10-CM | POA: Insufficient documentation

## 2017-03-27 DIAGNOSIS — Z87891 Personal history of nicotine dependence: Secondary | ICD-10-CM | POA: Insufficient documentation

## 2017-03-27 DIAGNOSIS — N3090 Cystitis, unspecified without hematuria: Secondary | ICD-10-CM | POA: Insufficient documentation

## 2017-03-27 DIAGNOSIS — R531 Weakness: Secondary | ICD-10-CM | POA: Diagnosis present

## 2017-03-27 DIAGNOSIS — E860 Dehydration: Secondary | ICD-10-CM | POA: Insufficient documentation

## 2017-03-27 DIAGNOSIS — N3000 Acute cystitis without hematuria: Secondary | ICD-10-CM

## 2017-03-27 LAB — URINALYSIS, ROUTINE W REFLEX MICROSCOPIC
Bacteria, UA: NONE SEEN
Bilirubin Urine: NEGATIVE
Glucose, UA: NEGATIVE mg/dL
Ketones, ur: NEGATIVE mg/dL
Nitrite: NEGATIVE
PH: 5 (ref 5.0–8.0)
Protein, ur: NEGATIVE mg/dL
SPECIFIC GRAVITY, URINE: 1.02 (ref 1.005–1.030)

## 2017-03-27 LAB — CBC WITH DIFFERENTIAL/PLATELET
Basophils Absolute: 0 10*3/uL (ref 0.0–0.1)
Basophils Relative: 0 %
Eosinophils Absolute: 0 10*3/uL (ref 0.0–0.7)
Eosinophils Relative: 0 %
HEMATOCRIT: 42.7 % (ref 36.0–46.0)
HEMOGLOBIN: 14 g/dL (ref 12.0–15.0)
LYMPHS ABS: 2.4 10*3/uL (ref 0.7–4.0)
LYMPHS PCT: 26 %
MCH: 27.5 pg (ref 26.0–34.0)
MCHC: 32.8 g/dL (ref 30.0–36.0)
MCV: 83.7 fL (ref 78.0–100.0)
MONO ABS: 0.6 10*3/uL (ref 0.1–1.0)
MONOS PCT: 7 %
NEUTROS ABS: 6 10*3/uL (ref 1.7–7.7)
Neutrophils Relative %: 67 %
Platelets: 176 10*3/uL (ref 150–400)
RBC: 5.1 MIL/uL (ref 3.87–5.11)
RDW: 14.3 % (ref 11.5–15.5)
WBC: 9.1 10*3/uL (ref 4.0–10.5)

## 2017-03-27 LAB — COMPREHENSIVE METABOLIC PANEL
ALBUMIN: 4.4 g/dL (ref 3.5–5.0)
ALK PHOS: 84 U/L (ref 38–126)
ALT: 18 U/L (ref 14–54)
ANION GAP: 6 (ref 5–15)
AST: 19 U/L (ref 15–41)
BILIRUBIN TOTAL: 0.9 mg/dL (ref 0.3–1.2)
BUN: 14 mg/dL (ref 6–20)
CALCIUM: 9.6 mg/dL (ref 8.9–10.3)
CO2: 31 mmol/L (ref 22–32)
Chloride: 104 mmol/L (ref 101–111)
Creatinine, Ser: 0.88 mg/dL (ref 0.44–1.00)
GFR calc Af Amer: >60 mL/min (ref >60)
GLUCOSE: 87 mg/dL (ref 65–99)
Potassium: 3.7 mmol/L (ref 3.5–5.1)
Sodium: 141 mmol/L (ref 135–145)
TOTAL PROTEIN: 7.9 g/dL (ref 6.5–8.1)

## 2017-03-27 LAB — TROPONIN I: Troponin I: <0.03 ng/mL (ref <0.03)

## 2017-03-27 MED ORDER — CEPHALEXIN 500 MG PO CAPS: 500.0000 mg | ORAL_CAPSULE | Freq: Four times a day (QID) | ORAL | 0 refills | Status: DC

## 2017-03-27 MED ORDER — SODIUM CHLORIDE 0.9 % IV BOLUS (SEPSIS)
1000.0000 mL | Freq: Once | INTRAVENOUS | Status: AC
  Administered 2017-03-27: 1000 mL via INTRAVENOUS

## 2017-03-27 NOTE — ED Notes (Signed)
Pt back from radiology 

## 2017-03-27 NOTE — Discharge Instructions (Signed)
Drink plenty of fluids. Follow-up with your family doctor within a week

## 2017-03-27 NOTE — ED Provider Notes (Signed)
[facility] DEPT Provider Note   CSN: [id number] Arrival date & time: 03/27/17  6283     History   Chief Complaint Chief Complaint  Patient presents with  . Weakness    HPI Christine Ray is a 61 y.o. female.  Patient complains of just general weakness. She has spent a lot of time to outside recently   The history is provided by the patient. No language interpreter was used.  Weakness  Primary symptoms include no focal weakness. This is a new problem. The current episode started 12 to 24 hours ago. The problem has not changed since onset.There was no focality noted. There has been no fever. Pertinent negatives include no shortness of breath, no chest pain and no headaches.    Past Medical History:  Diagnosis Date  . Hyperlipidemia     Patient Active Problem List   Diagnosis Date Noted  . Hyperlipidemia 04/26/2016  . Vitamin D deficiency 04/26/2016  . BPPV (benign paroxysmal positional vertigo) 04/26/2016  . Varicose veins 04/26/2016  . GERD (gastroesophageal reflux disease) 01/04/2015  . Osteoporosis 01/04/2015  . Postherpetic neuralgia 01/04/2015  . Diffuse arthralgia 01/04/2015  . Lateral epicondylitis of right elbow 01/04/2015    Past Surgical History:  Procedure Laterality Date  . COLONOSCOPY WITH PROPOFOL N/A 11/13/2016   Procedure: COLONOSCOPY WITH PROPOFOL;  Surgeon: Lollie Sails, MD;  Location: [facility] ENDOSCOPY;  Service: Endoscopy;  Laterality: N/A;    OB History    No data available       Home Medications    Prior to Admission medications   Medication Sig Start Date End Date Taking? Authorizing Provider  Ascorbic Acid (VITAMIN C) 500 MG CAPS Take 1,000 mg by mouth.   Yes [provider]  aspirin EC 81 MG tablet Take 81 mg by mouth daily as needed (learned that it is good to take everyday).   Yes [provider]  Cholecalciferol (VITAMIN D) 2000 units tablet Take 2,000 Units by mouth daily.   Yes [provider]    solifenacin (VESICARE) 10 MG tablet Take 10 mg by mouth daily as needed (for overactive bladder at night).  05/03/16 05/03/17 Yes [provider]  traMADol (ULTRAM) 50 MG tablet Take 50 mg by mouth every 6 (six) hours as needed.   Yes [provider]  cephALEXin (KEFLEX) 500 MG capsule Take 1 capsule (500 mg total) by mouth 4 (four) times daily. 03/27/17   Milton Ferguson, MD    Family History Family History  Problem Relation Age of Onset  . Breast cancer Mother 67  . Prostate cancer Father     Social History Social History  Substance Use Topics  . Smoking status: Former Smoker    Types: Cigarettes    Quit date: 10/14/1975  . Smokeless tobacco: Never Used  . Alcohol use No     Allergies   Hydrocodone; Oxycodone; and Oxycodone hcl   Review of Systems Review of Systems  Constitutional: Negative for appetite change and fatigue.  HENT: Negative for congestion, ear discharge and sinus pressure.   Eyes: Negative for discharge.  Respiratory: Negative for cough and shortness of breath.   Cardiovascular: Negative for chest pain.  Gastrointestinal: Negative for abdominal pain and diarrhea.  Genitourinary: Negative for frequency and hematuria.  Musculoskeletal: Negative for back pain.  Skin: Negative for rash.  Neurological: Positive for weakness. Negative for focal weakness, seizures and headaches.  Psychiatric/Behavioral: Negative for hallucinations.     Physical Exam Updated Vital Signs BP 98/69 (  BP Location: Left Arm)   Pulse 62   Temp 97.9 F (36.6 C) (Oral)   Resp 14   Ht 5\' 10"  (1.778 m)   Wt 83 kg (183 lb)   SpO2 100%   BMI 26.26 kg/m   Physical Exam  Constitutional: She is oriented to person, place, and time. She appears well-developed.  HENT:  Head: Normocephalic.  Eyes: Conjunctivae and EOM are normal. No scleral icterus.  Neck: Neck supple. No thyromegaly present.  Cardiovascular: Normal rate and regular rhythm.  Exam reveals no gallop and  no friction rub.   No murmur heard. Pulmonary/Chest: No stridor. She has no wheezes. She has no rales. She exhibits no tenderness.  Abdominal: She exhibits no distension. There is no tenderness. There is no rebound.  Musculoskeletal: Normal range of motion. She exhibits no edema.  Lymphadenopathy:    She has no cervical adenopathy.  Neurological: She is oriented to person, place, and time. She exhibits normal muscle tone. Coordination normal.  Skin: No rash noted. No erythema.  Psychiatric: She has a normal mood and affect. Her behavior is normal.     ED Treatments / Results  Labs (all labs ordered are listed, but only abnormal results are displayed) Labs Reviewed  URINALYSIS, ROUTINE W REFLEX MICROSCOPIC - Abnormal; Notable for the following:       Result Value   APPearance HAZY (*)    Hgb urine dipstick SMALL (*)    Leukocytes, UA SMALL (*)    Squamous Epithelial / LPF 0-5 (*)    All other components within normal limits  URINE CULTURE  CBC WITH DIFFERENTIAL/PLATELET  COMPREHENSIVE METABOLIC PANEL  TROPONIN I    EKG  EKG Interpretation None       Radiology Dg Chest 2 View  Result Date: 03/27/2017 CLINICAL DATA:  Weakness EXAM: CHEST  2 VIEW COMPARISON:  None. FINDINGS: Normal heart size and mediastinal contours. No acute infiltrate or edema. No effusion or pneumothorax. No acute osseous findings. IMPRESSION: Negative chest. Electronically Signed   By: Monte Fantasia M.D.   On: 03/27/2017 10:00    Procedures Procedures (including critical care time)  Medications Ordered in ED Medications  sodium chloride 0.9 % bolus 1,000 mL (0 mLs Intravenous Stopped 03/27/17 1044)     Initial Impression / Assessment and Plan / ED Course  I have reviewed the triage vital signs and the nursing notes.  Pertinent labs & imaging results that were available during my care of the patient were reviewed by me and considered in my medical decision making (see chart for details).      Patient improved with IV fluids. Labs unremarkable except for white cells in her urine. We will culture her urine and place her on Keflex and have her follow-up with her PCP  Final Clinical Impressions(s) / ED Diagnoses   Final diagnoses:  Dehydration  Acute cystitis without hematuria    New Prescriptions New Prescriptions   CEPHALEXIN (KEFLEX) 500 MG CAPSULE    Take 1 capsule (500 mg total) by mouth 4 (four) times daily.     Milton Ferguson, MD 03/27/17 1104

## 2017-03-27 NOTE — ED Triage Notes (Signed)
Pt states that she woke up this morning and was very light headed.  She states that she became very sweaty and felt like she was going to faint.  She checked her blood pressure and 94/52.  Pt states that her left left leg has also been bothering her and she has been going to the doctor for poor circulation.  She states that she took a tramadol last night for her left leg pain.  Pt denies fever, chills, and chest pain.

## 2017-03-29 LAB — URINE CULTURE

## 2017-04-06 ENCOUNTER — Encounter: Payer: Self-pay | Admitting: Internal Medicine

## 2017-04-06 ENCOUNTER — Ambulatory Visit
Admission: RE | Admit: 2017-04-06 | Discharge: 2017-04-06 | Disposition: A | Discharge status: Home / Self Care | Payer: Commercial Managed Care - PPO | Source: Ambulatory Visit | Attending: Internal Medicine | Admitting: Internal Medicine

## 2017-04-06 ENCOUNTER — Ambulatory Visit (INDEPENDENT_AMBULATORY_CARE_PROVIDER_SITE_OTHER): Payer: Commercial Managed Care - PPO | Admitting: Internal Medicine

## 2017-04-06 VITALS — BP 112/78 | HR 68 | Ht 71.0 in | Wt 187.0 lb

## 2017-04-06 DIAGNOSIS — G8929 Other chronic pain: Secondary | ICD-10-CM | POA: Insufficient documentation

## 2017-04-06 DIAGNOSIS — M25562 Pain in left knee: Principal | ICD-10-CM

## 2017-04-06 DIAGNOSIS — N3000 Acute cystitis without hematuria: Secondary | ICD-10-CM | POA: Diagnosis not present

## 2017-04-06 DIAGNOSIS — M1712 Unilateral primary osteoarthritis, left knee: Secondary | ICD-10-CM | POA: Diagnosis not present

## 2017-04-06 DIAGNOSIS — E782 Mixed hyperlipidemia: Secondary | ICD-10-CM | POA: Diagnosis not present

## 2017-04-06 LAB — POCT URINALYSIS DIPSTICK
BILIRUBIN UA: NEGATIVE
Glucose, UA: NEGATIVE
KETONES UA: NEGATIVE
LEUKOCYTES UA: NEGATIVE
NITRITE UA: NEGATIVE
PROTEIN UA: NEGATIVE
Spec Grav, UA: 1.01 (ref 1.010–1.025)
Urobilinogen, UA: 0.2 E.U./dL
pH, UA: 5 (ref 5.0–8.0)

## 2017-04-06 MED ORDER — NAPROXEN SODIUM 220 MG PO TABS: 440.0000 mg | ORAL_TABLET | Freq: Two times a day (BID) | ORAL | 5 refills | Status: DC

## 2017-04-06 NOTE — Patient Instructions (Signed)
Aleve 220 mg - can take up to 4 per day

## 2017-04-06 NOTE — Addendum Note (Signed)
Addended by: Glean Hess on: 04/06/2017 11:57 AM   Modules accepted: Orders

## 2017-04-06 NOTE — Progress Notes (Signed)
Date:  04/06/2017   Name:  Christine Ray   DOB:  [birth year]   MRN:  [id number]   Chief Complaint: Hospitalization Follow-up (in ED 03/27/17 with weakness and UTI.  Received IVF and sent home with antibiotics. - feeling much better) Urine culture was positive for multiple organisms.  She was treated with keflex.  Currently no UTI sx. BP was on the low end - doing better now.  She has been having more left lateral knee pain over the past few months.  Initially had ankle and foot pain - was seen by Dr. Earnestine Ray who did xrays of foot and ankle which were normal.  She was then sent to Vascular surgery for evaluation of varicose veins and is now wearing compression stockings.   Aleve has been very helpful for pain.   Knee Pain   There was no injury mechanism. The pain is present in the left knee. The quality of the pain is described as aching and cramping. The pain is moderate. Associated symptoms include an inability to bear weight and a loss of motion. The symptoms are aggravated by movement and weight bearing. She has tried NSAIDs for the symptoms. The treatment provided mild relief.   Review of Systems  Constitutional: Negative for chills and fatigue.  Respiratory: Negative for chest tightness and shortness of breath.   Cardiovascular: Positive for leg swelling. Negative for chest pain and palpitations.  Musculoskeletal: Positive for arthralgias. Negative for gait problem and joint swelling.  Neurological: Negative for dizziness, light-headedness and headaches.    Patient Active Problem List   Diagnosis Date Noted  . Hyperlipidemia 04/26/2016  . Vitamin D deficiency 04/26/2016  . BPPV (benign paroxysmal positional vertigo) 04/26/2016  . Varicose veins 04/26/2016  . GERD (gastroesophageal reflux disease) 01/04/2015  . Osteoporosis 01/04/2015  . Postherpetic neuralgia 01/04/2015  . Diffuse arthralgia 01/04/2015  . Lateral epicondylitis of right elbow 01/04/2015    Prior to  Admission medications   Medication Sig Start Date End Date Taking? Authorizing Provider  Ascorbic Acid (VITAMIN C) 500 MG CAPS Take 1,000 mg by mouth.    [provider]  aspirin EC 81 MG tablet Take 81 mg by mouth daily as needed (learned that it is good to take everyday).    [provider]  cephALEXin (KEFLEX) 500 MG capsule Take 1 capsule (500 mg total) by mouth 4 (four) times daily. 03/27/17   Christine Ferguson, MD  Cholecalciferol (VITAMIN D) 2000 units tablet Take 2,000 Units by mouth daily.    [provider]  solifenacin (VESICARE) 10 MG tablet Take 10 mg by mouth daily as needed (for overactive bladder at night).  05/03/16 05/03/17  [provider]  traMADol (ULTRAM) 50 MG tablet Take 50 mg by mouth every 6 (six) hours as needed.    [provider]    Allergies  Allergen Reactions  . Hydrocodone Other (See Comments)    headache  . Oxycodone     Rash  . Oxycodone Hcl Rash    Past Surgical History:  Procedure Laterality Date  . COLONOSCOPY WITH PROPOFOL N/A 11/13/2016   Procedure: COLONOSCOPY WITH PROPOFOL;  Surgeon: Christine Sails, MD;  Location: [facility] ENDOSCOPY;  Service: Endoscopy;  Laterality: N/A;    Social History  Substance Use Topics  . Smoking status: Former Smoker    Types: Cigarettes    Quit date: 10/14/1975  . Smokeless tobacco: Never Used  . Alcohol use No     Medication list has  been reviewed and updated.   Physical Exam  Constitutional: She is oriented to person, place, and time. She appears well-developed. No distress.  HENT:  Head: Normocephalic and atraumatic.  Neck: Normal range of motion. Neck supple.  Cardiovascular: Normal rate, regular rhythm and normal heart sounds.   Pulmonary/Chest: Effort normal and breath sounds normal. No respiratory distress. She has no wheezes.  Abdominal: Soft. Bowel sounds are normal. There is no tenderness.  Musculoskeletal: She exhibits edema.       Left knee: She exhibits  normal range of motion, no swelling and no effusion. Tenderness found. Lateral joint line tenderness noted.  Neurological: She is alert and oriented to person, place, and time.  Skin: Skin is warm and dry. No rash noted.  Psychiatric: She has a normal mood and affect. Her behavior is normal. Thought content normal.  Nursing note and vitals reviewed.   BP 112/78   Pulse 68   Ht 5\' 11"  (1.803 m)   Wt 187 lb (84.8 kg)   SpO2 99%   BMI 26.08 kg/m   Assessment and Plan: 1. Acute cystitis without hematuria Resolved Continue adequate fluid intake  2. Chronic pain of left knee Continue aleve If xrays show significant OA will refer back to Ortho - DG Knee Complete 4 Views Left; Future - naproxen sodium (ALEVE) 220 MG tablet; Take 2 tablets (440 mg total) by mouth 2 (two) times daily with a meal.  Dispense: 120 tablet; Refill: 5  3. Mixed hyperlipidemia Continue diet changes   Meds ordered this encounter  Medications  . naproxen sodium (ALEVE) 220 MG tablet    Sig: Take 2 tablets (440 mg total) by mouth 2 (two) times daily with a meal.    Dispense:  120 tablet    Refill:  Pala, MD [facility]  04/06/2017

## 2017-04-17 ENCOUNTER — Other Ambulatory Visit: Payer: Self-pay

## 2017-04-17 DIAGNOSIS — M25562 Pain in left knee: Principal | ICD-10-CM

## 2017-04-17 DIAGNOSIS — G8929 Other chronic pain: Secondary | ICD-10-CM

## 2017-06-04 ENCOUNTER — Other Ambulatory Visit: Payer: Self-pay | Admitting: Obstetrics and Gynecology

## 2017-06-04 DIAGNOSIS — Z1231 Encounter for screening mammogram for malignant neoplasm of breast: Secondary | ICD-10-CM

## 2017-06-11 ENCOUNTER — Ambulatory Visit: Payer: Commercial Managed Care - PPO | Admitting: General Surgery

## 2017-07-02 ENCOUNTER — Ambulatory Visit
Admission: RE | Admit: 2017-07-02 | Discharge: 2017-07-02 | Disposition: A | Discharge status: Home / Self Care | Payer: Commercial Managed Care - PPO | Source: Ambulatory Visit | Attending: Obstetrics and Gynecology | Admitting: Obstetrics and Gynecology

## 2017-07-02 DIAGNOSIS — Z1231 Encounter for screening mammogram for malignant neoplasm of breast: Secondary | ICD-10-CM | POA: Diagnosis present

## 2017-07-30 ENCOUNTER — Ambulatory Visit: Payer: Commercial Managed Care - PPO

## 2017-10-02 ENCOUNTER — Encounter: Payer: Self-pay | Admitting: Internal Medicine

## 2017-10-02 ENCOUNTER — Ambulatory Visit: Payer: Commercial Managed Care - PPO | Admitting: Internal Medicine

## 2017-10-02 ENCOUNTER — Other Ambulatory Visit: Payer: Self-pay | Admitting: Internal Medicine

## 2017-10-02 VITALS — BP 122/70 | HR 86 | Ht 71.0 in | Wt 187.0 lb

## 2017-10-02 DIAGNOSIS — B029 Zoster without complications: Secondary | ICD-10-CM | POA: Diagnosis not present

## 2017-10-02 DIAGNOSIS — M778 Other enthesopathies, not elsewhere classified: Secondary | ICD-10-CM

## 2017-10-02 MED ORDER — VALACYCLOVIR HCL 1 G PO TABS: 1000.0000 mg | ORAL_TABLET | Freq: Three times a day (TID) | ORAL | 0 refills | Status: DC

## 2017-10-02 MED ORDER — NAPROXEN SODIUM 220 MG PO TABS: 440.0000 mg | ORAL_TABLET | Freq: Two times a day (BID) | ORAL | 5 refills | Status: DC

## 2017-10-02 NOTE — Progress Notes (Signed)
Date:  10/02/2017   Name:  Christine Ray   DOB:  1956-02-04   MRN:  [id number]   Chief Complaint: Back Pain (Burning, sensative to touch. Right side back pain. Can't wear bra because so sensative. ) and Wrist Pain (Rt wrist pain. Started yesterday. Tendonitis before at previous job. When shifting wrist it hurts. )  Started 2 days ago with burning and itching across her right mid back.  No injury or rash.  No hx of shingles.  She also injured her right wrist yesterday picking up tree limbs.  It is tender along the dorsum but not swollen.  It feels better after ibuprofen.  No weakness or numbness noted.  Review of Systems  Constitutional: Negative for chills, fatigue and fever.  Respiratory: Negative for chest tightness and shortness of breath.   Cardiovascular: Negative for chest pain and palpitations.  Gastrointestinal: Negative for abdominal pain.  Musculoskeletal: Positive for arthralgias (right wrist).  Skin:       Burning and itching    Patient Active Problem List   Diagnosis Date Noted  . Chronic pain of left knee 04/06/2017  . Hyperlipidemia 04/26/2016  . Vitamin D deficiency 04/26/2016  . BPPV (benign paroxysmal positional vertigo) 04/26/2016  . Varicose veins 04/26/2016  . GERD (gastroesophageal reflux disease) 01/04/2015  . Osteoporosis 01/04/2015  . Postherpetic neuralgia 01/04/2015  . Diffuse arthralgia 01/04/2015  . Lateral epicondylitis of right elbow 01/04/2015    Prior to Admission medications   Medication Sig Start Date End Date Taking? Authorizing Provider  Ascorbic Acid (VITAMIN C) 500 MG CAPS Take 1,000 mg by mouth.   Yes [provider]  aspirin EC 81 MG tablet Take 81 mg by mouth daily as needed (learned that it is good to take everyday).   Yes [provider]  Cholecalciferol (VITAMIN D) 2000 units tablet Take 2,000 Units by mouth daily.   Yes [provider]  naproxen sodium (ALEVE) 220 MG tablet Take 2 tablets (440 mg  total) by mouth 2 (two) times daily with a meal. 04/06/17  Yes Glean Hess, MD  simvastatin (ZOCOR) 10 MG tablet Take 10 mg by mouth at bedtime. 09/27/17  Yes [provider]  traMADol (ULTRAM) 50 MG tablet Take 50 mg by mouth every 6 (six) hours as needed.   Yes [provider]    Allergies  Allergen Reactions  . Hydrocodone Other (See Comments)    headache  . Oxycodone Hcl Rash    Past Surgical History:  Procedure Laterality Date  . COLONOSCOPY WITH PROPOFOL N/A 11/13/2016   Procedure: COLONOSCOPY WITH PROPOFOL;  Surgeon: Lollie Sails, MD;  Location: [facility] ENDOSCOPY;  Service: Endoscopy;  Laterality: N/A;    Social History   Tobacco Use  . Smoking status: Former Smoker    Types: Cigarettes    Last attempt to quit: 10/14/1975    Years since quitting: 41.9  . Smokeless tobacco: Never Used  Substance Use Topics  . Alcohol use: No  . Drug use: No     Medication list has been reviewed and updated.  No flowsheet data found.  Physical Exam  Constitutional: She is oriented to person, place, and time. She appears well-developed. No distress.  HENT:  Head: Normocephalic and atraumatic.  Cardiovascular: Normal rate, regular rhythm and normal heart sounds.  Pulmonary/Chest: Effort normal and breath sounds normal. No respiratory distress. She has no wheezes.  Musculoskeletal:       Right wrist: She exhibits tenderness. She exhibits  no swelling, no effusion and no crepitus.  Neurological: She is alert and oriented to person, place, and time. She has normal strength.  Skin: Skin is warm and dry. No rash noted.     Psychiatric: She has a normal mood and affect. Her behavior is normal. Thought content normal.  Nursing note and vitals reviewed.   BP 122/70   Pulse 86   Ht 5\' 11"  (1.803 m)   Wt 187 lb (84.8 kg)   SpO2 100%   BMI 26.08 kg/m   Assessment and Plan: 1. Herpes zoster without complication Aleve bid for pain Consider Shingrix vaccine in  6 mnths - valACYclovir (VALTREX) 1000 MG tablet; Take 1 tablet (1,000 mg total) by mouth 3 (three) times daily.  Dispense: 30 tablet; Refill: 0  2. Tendonitis of wrist, right - naproxen sodium (ALEVE) 220 MG tablet; Take 2 tablets (440 mg total) by mouth 2 (two) times daily with a meal.  Dispense: 120 tablet; Refill: 5   Meds ordered this encounter  Medications  . naproxen sodium (ALEVE) 220 MG tablet    Sig: Take 2 tablets (440 mg total) by mouth 2 (two) times daily with a meal.    Dispense:  120 tablet    Refill:  5  . valACYclovir (VALTREX) 1000 MG tablet    Sig: Take 1 tablet (1,000 mg total) by mouth 3 (three) times daily.    Dispense:  30 tablet    Refill:  0    Partially dictated using Editor, commissioning. Any errors are unintentional.  Halina Maidens, MD [facility]  10/02/2017

## 2018-05-22 ENCOUNTER — Encounter: Payer: Self-pay | Admitting: Internal Medicine

## 2018-05-22 ENCOUNTER — Ambulatory Visit (INDEPENDENT_AMBULATORY_CARE_PROVIDER_SITE_OTHER): Payer: Commercial Managed Care - PPO | Admitting: Internal Medicine

## 2018-05-22 VITALS — BP 108/80 | HR 72 | Ht 71.0 in | Wt 196.0 lb

## 2018-05-22 DIAGNOSIS — E782 Mixed hyperlipidemia: Secondary | ICD-10-CM

## 2018-05-22 DIAGNOSIS — J0100 Acute maxillary sinusitis, unspecified: Secondary | ICD-10-CM | POA: Diagnosis not present

## 2018-05-22 MED ORDER — AMOXICILLIN-POT CLAVULANATE 875-125 MG PO TABS
1.0000 | ORAL_TABLET | Freq: Two times a day (BID) | ORAL | 0 refills | Status: DC
Start: 2018-05-22 — End: 2019-01-29

## 2018-05-22 NOTE — Patient Instructions (Signed)
Continue Flonase nasal spray in both nostrils daily

## 2018-05-22 NOTE — Progress Notes (Signed)
Date:  05/22/2018   Name:  Christine Ray   DOB:  May 14, 1956   MRN:  [id number]   Chief Complaint: nose pain (X 1 week. Been having allergy issues. Rigth nostril is swollen and red. Wanted to get checked out. Dose use nasal spray every morning. )  Noticed some pressure over left maxillary sinus last week.  Over the weekend right nares was sore and husband said the inside appeared red. She has flonase but has not been using it.   Lipids - she is now on simvastatin.  She has not had her labs rechecked.  As far as she is aware, she has no side effects.  Lab Results  Component Value Date   CHOL 228 (H) 10/27/2016   HDL 55 10/27/2016   LDLCALC 159 (H) 10/27/2016   TRIG 70 10/27/2016   CHOLHDL 4.1 10/27/2016     Review of Systems  Constitutional: Negative for chills, fatigue and fever.  HENT: Positive for congestion, postnasal drip and sinus pressure. Negative for sore throat and trouble swallowing.   Respiratory: Negative for cough, chest tightness, shortness of breath and wheezing.   Cardiovascular: Negative for chest pain and palpitations.  Musculoskeletal: Negative for arthralgias, gait problem and myalgias.  Neurological: Negative for dizziness, light-headedness and headaches.    Patient Active Problem List   Diagnosis Date Noted  . Chronic pain of left knee 04/06/2017  . Hyperlipidemia 04/26/2016  . Vitamin D deficiency 04/26/2016  . BPPV (benign paroxysmal positional vertigo) 04/26/2016  . Varicose veins 04/26/2016  . GERD (gastroesophageal reflux disease) 01/04/2015  . Osteoporosis 01/04/2015  . Diffuse arthralgia 01/04/2015  . Lateral epicondylitis of right elbow 01/04/2015    Allergies  Allergen Reactions  . Hydrocodone Other (See Comments)    headache  . Oxycodone Hcl Rash    Past Surgical History:  Procedure Laterality Date  . COLONOSCOPY WITH PROPOFOL N/A 11/13/2016   Procedure: COLONOSCOPY WITH PROPOFOL;  Surgeon: Lollie Sails, MD;  Location: [facility]  ENDOSCOPY;  Service: Endoscopy;  Laterality: N/A;    Social History   Tobacco Use  . Smoking status: Former Smoker    Types: Cigarettes    Last attempt to quit: 10/14/1975    Years since quitting: 42.6  . Smokeless tobacco: Never Used  Substance Use Topics  . Alcohol use: No  . Drug use: No     Medication list has been reviewed and updated.  Current Meds  Medication Sig  . Ascorbic Acid (VITAMIN C) 500 MG CAPS Take 1,000 mg by mouth.  Marland Kitchen aspirin EC 81 MG tablet Take 81 mg by mouth daily as needed (learned that it is good to take everyday).  . Cholecalciferol (VITAMIN D) 2000 units tablet Take 2,000 Units by mouth daily.  . naproxen sodium (ALEVE) 220 MG tablet Take 2 tablets (440 mg total) by mouth 2 (two) times daily with a meal.  . simvastatin (ZOCOR) 10 MG tablet Take 10 mg by mouth at bedtime.  . traMADol (ULTRAM) 50 MG tablet Take 50 mg by mouth every 6 (six) hours as needed.  . valACYclovir (VALTREX) 1000 MG tablet Take 1 tablet (1,000 mg total) by mouth 3 (three) times daily.    PHQ 2/9 Scores 05/22/2018  PHQ - 2 Score 0    Physical Exam  Constitutional: She is oriented to person, place, and time. She appears well-developed and well-nourished.  HENT:  Right Ear: External ear and ear canal normal. Tympanic membrane is not erythematous and not retracted.  Left Ear: External ear and ear canal normal. Tympanic membrane is not erythematous and not retracted.  Nose: Mucosal edema and sinus tenderness present. Right sinus exhibits maxillary sinus tenderness. Right sinus exhibits no frontal sinus tenderness. Left sinus exhibits maxillary sinus tenderness. Left sinus exhibits no frontal sinus tenderness.  Mouth/Throat: Uvula is midline and mucous membranes are normal. No oral lesions. Posterior oropharyngeal erythema present. No oropharyngeal exudate.  Cardiovascular: Normal rate, regular rhythm and normal heart sounds.  Pulmonary/Chest: Breath sounds normal. She has no wheezes.  She has no rales.  Musculoskeletal: She exhibits no edema.  Lymphadenopathy:    She has no cervical adenopathy.  Neurological: She is alert and oriented to person, place, and time.    BP 108/80 (BP Location: Right Arm, Patient Position: Sitting, Cuff Size: Normal)   Pulse 72   Ht 5\' 11"  (1.803 m)   Wt 196 lb (88.9 kg)   SpO2 100%   BMI 27.34 kg/m   Assessment and Plan: 1. Acute non-recurrent maxillary sinusitis Resume use of flonase If no improvement, will refer to ENT. - amoxicillin-clavulanate (AUGMENTIN) 875-125 MG tablet; Take 1 tablet by mouth 2 (two) times daily.  Dispense: 20 tablet; Refill: 0 - CBC with Differential/Platelet  2. Mixed hyperlipidemia Continue statin - check labs - Lipid panel - Comprehensive metabolic panel   Meds ordered this encounter  Medications  . amoxicillin-clavulanate (AUGMENTIN) 875-125 MG tablet    Sig: Take 1 tablet by mouth 2 (two) times daily.    Dispense:  20 tablet    Refill:  0    Partially dictated using Editor, commissioning. Any errors are unintentional.  Halina Maidens, MD [facility]  05/22/2018   There are no diagnoses linked to this encounter.

## 2018-05-27 ENCOUNTER — Telehealth: Payer: Self-pay

## 2018-05-27 ENCOUNTER — Other Ambulatory Visit: Payer: Self-pay | Admitting: Internal Medicine

## 2018-05-27 MED ORDER — FLUCONAZOLE 100 MG PO TABS: 100.0000 mg | ORAL_TABLET | Freq: Once | ORAL | 0 refills | Status: AC

## 2018-05-27 NOTE — Telephone Encounter (Signed)
Vaginal itching/yeast infection is not an allergic reaction.  I will send in a Diflucan tablet for her to take to treat the yeast.  She should complete the antibiotics.

## 2018-05-27 NOTE — Telephone Encounter (Signed)
Patient called stating she was seen last week for sinusitis and was given amoxicillin for infection. It is causing her to have vaginal infection- itching. Stated she wanted to know if she could  Have different antibiotic?  Please Advise.

## 2018-05-27 NOTE — Telephone Encounter (Signed)
Patient informed of diflucan sent in and to continue antibiotics. Also reminded her to have her labs done. She said she has a $200 bill at [facility] so I told her directions on how to go about get labs done downstairs in imaging department. She verbalized understanding.

## 2018-06-03 ENCOUNTER — Other Ambulatory Visit: Payer: Self-pay | Admitting: Obstetrics and Gynecology

## 2018-06-11 ENCOUNTER — Other Ambulatory Visit: Payer: Self-pay | Admitting: Obstetrics and Gynecology

## 2018-06-11 DIAGNOSIS — Z1231 Encounter for screening mammogram for malignant neoplasm of breast: Secondary | ICD-10-CM

## 2018-06-11 LAB — HM PAP SMEAR: HM Pap smear: NEGATIVE

## 2018-06-17 LAB — BASIC METABOLIC PANEL
BUN: 10 (ref 4–21)
Creatinine: 0.9 (ref 0.5–1.1)

## 2018-06-17 LAB — HEPATIC FUNCTION PANEL
ALT: 14 (ref 7–35)
AST: 16 (ref 13–35)

## 2018-06-17 LAB — LIPID PANEL
Cholesterol: 173 (ref 0–200)
HDL: 49 (ref 35–70)
LDL Cholesterol: 103
Triglycerides: 106 (ref 40–160)

## 2018-06-17 LAB — VITAMIN D 25 HYDROXY (VIT D DEFICIENCY, FRACTURES): Vit D, 25-Hydroxy: 31

## 2018-07-03 ENCOUNTER — Ambulatory Visit
Admission: RE | Admit: 2018-07-03 | Discharge: 2018-07-03 | Disposition: A | Discharge status: Home / Self Care | Payer: Commercial Managed Care - PPO | Source: Ambulatory Visit | Attending: Obstetrics and Gynecology | Admitting: Obstetrics and Gynecology

## 2018-07-03 DIAGNOSIS — Z1231 Encounter for screening mammogram for malignant neoplasm of breast: Secondary | ICD-10-CM | POA: Insufficient documentation

## 2019-01-29 ENCOUNTER — Encounter: Payer: Self-pay | Admitting: Internal Medicine

## 2019-01-29 ENCOUNTER — Other Ambulatory Visit: Payer: Self-pay

## 2019-01-29 ENCOUNTER — Other Ambulatory Visit: Payer: Self-pay | Admitting: Internal Medicine

## 2019-01-29 ENCOUNTER — Ambulatory Visit (INDEPENDENT_AMBULATORY_CARE_PROVIDER_SITE_OTHER): Payer: Medicare HMO | Admitting: Internal Medicine

## 2019-01-29 VITALS — BP 112/78 | HR 79 | Ht 71.0 in | Wt 197.0 lb

## 2019-01-29 DIAGNOSIS — Z23 Encounter for immunization: Secondary | ICD-10-CM

## 2019-01-29 DIAGNOSIS — M67442 Ganglion, left hand: Secondary | ICD-10-CM | POA: Diagnosis not present

## 2019-01-29 DIAGNOSIS — S8011XA Contusion of right lower leg, initial encounter: Secondary | ICD-10-CM

## 2019-01-29 NOTE — Patient Instructions (Signed)
Pneumococcal Conjugate Vaccine (PCV13): What You Need to Know  1. Why get vaccinated?  Vaccination can protect both children and adults from pneumococcal disease.  Pneumococcal disease is caused by bacteria that can spread from person to person through close contact. It can cause ear infections, and it can also lead to more serious infections of the:  · Lungs (pneumonia),  · Blood (bacteremia), and  · Covering of the brain and spinal cord (meningitis).  Pneumococcal pneumonia is most common among adults. Pneumococcal meningitis can cause deafness and brain damage, and it kills about 1 child in 10 who get it.  Anyone can get pneumococcal disease, but children under 2 years of age and adults 65 years and older, people with certain medical conditions, and cigarette smokers are at the highest risk.  Before there was a vaccine, the [place] saw:  · more than 700 cases of meningitis,  · about 13,000 blood infections,  · about 5 million ear infections, and  · about 200 deaths  in children under 5 each year from pneumococcal disease. Since vaccine became available, severe pneumococcal disease in these children has fallen by 88%.  About 18,000 older adults die of pneumococcal disease each year in the [place].  Treatment of pneumococcal infections with penicillin and other drugs is not as effective as it used to be, because some strains of the disease have become resistant to these drugs. This makes prevention of the disease, through vaccination, even more important.  2. PCV13 vaccine  Pneumococcal conjugate vaccine (called PCV13) protects against 13 types of pneumococcal bacteria.  PCV13 is routinely given to children at 2, 4, 6, and 12-15 months of age. It is also recommended for children and adults 2 to 64 years of age with certain health conditions, and for all adults 65 years of age and older. Your doctor can give you details.  3. Some people should not get this vaccine  Anyone who has ever had a  life-threatening allergic reaction to a dose of this vaccine, to an earlier pneumococcal vaccine called PCV7, or to any vaccine containing diphtheria toxoid (for example, DTaP), should not get PCV13.  Anyone with a severe allergy to any component of PCV13 should not get the vaccine. Tell your doctor if the person being vaccinated has any severe allergies.  If the person scheduled for vaccination is not feeling well, your healthcare provider might decide to reschedule the shot on another day.  4. Risks of a vaccine reaction  With any medicine, including vaccines, there is a chance of reactions. These are usually mild and go away on their own, but serious reactions are also possible.  Problems reported following PCV13 varied by age and dose in the series. The most common problems reported among children were:  · About half became drowsy after the shot, had a temporary loss of appetite, or had redness or tenderness where the shot was given.  · About 1 out of 3 had swelling where the shot was given.  · About 1 out of 3 had a mild fever, and about 1 in 20 had a fever over 102.2°F.  · Up to about 8 out of 10 became fussy or irritable.  Adults have reported pain, redness, and swelling where the shot was given; also mild fever, fatigue, headache, chills, or muscle pain.  Young children who get PCV13 along with inactivated flu vaccine at the same time may be at increased risk for seizures caused by fever. Ask your doctor for more   information.  Problems that could happen after any vaccine:  · People sometimes faint after a medical procedure, including vaccination. Sitting or lying down for about 15 minutes can help prevent fainting, and injuries caused by a fall. Tell your doctor if you feel dizzy, or have vision changes or ringing in the ears.  · Some older children and adults get severe pain in the shoulder and have difficulty moving the arm where a shot was given. This happens very rarely.  · Any medication can cause a  severe allergic reaction. Such reactions from a vaccine are very rare, estimated at about 1 in a million doses, and would happen within a few minutes to a few hours after the vaccination.  As with any medicine, there is a very small chance of a vaccine causing a serious injury or death.  The safety of vaccines is always being monitored. For more information, visit: www.cdc.gov/vaccinesafety/  5. What if there is a serious reaction?  What should I look for?  · Look for anything that concerns you, such as signs of a severe allergic reaction, very high fever, or unusual behavior.  Signs of a severe allergic reaction can include hives, swelling of the face and throat, difficulty breathing, a fast heartbeat, dizziness, and weakness--usually within a few minutes to a few hours after the vaccination.  What should I do?  · If you think it is a severe allergic reaction or other emergency that can't wait, call 9-1-1 or get the person to the nearest hospital. Otherwise, call your doctor.  Reactions should be reported to the Vaccine Adverse Event Reporting System (VAERS). Your doctor should file this report, or you can do it yourself through the VAERS web site at www.vaers.hhs.gov, or by calling 1-800-822-7967.  VAERS does not give medical advice.  6. The National Vaccine Injury Compensation Program  The National Vaccine Injury Compensation Program (VICP) is a federal program that was created to compensate people who may have been injured by certain vaccines.  Persons who believe they may have been injured by a vaccine can learn about the program and about filing a claim by calling 1-800-338-2382 or visiting the VICP website at www.hrsa.gov/vaccinecompensation. There is a time limit to file a claim for compensation.  7. How can I learn more?  · Ask your healthcare provider. He or she can give you the vaccine package insert or suggest other sources of information.  · Call your local or state health department.  · Contact the  Centers for Disease Control and Prevention (CDC):  ? Call 1-800-232-4636 (1-800-CDC-INFO) or  ? Visit CDC's website at www.cdc.gov/vaccines  Vaccine Information Statement PCV13 Vaccine (07/23/2014)  This information is not intended to replace advice given to you by your health care provider. Make sure you discuss any questions you have with your health care provider.  Document Released: 07/02/2006 Document Revised: 04/16/2018 Document Reviewed: 04/16/2018  Elsevier Interactive Patient Education © 2019 Elsevier Inc.

## 2019-01-29 NOTE — Progress Notes (Signed)
Date:  01/29/2019   Name:  Christine Ray   DOB:  07-04-1956   MRN:  [id number]   Chief Complaint: Hand Pain (Knot on left hand. Noticed it 2 months ago. No pain just knot on hand) and Sore (Right lower leg sore. Painful. Started 2 weeks ago. Bruised and painful. Tables and chairs fell on her leg after she stacked them. If she wraps it the pain fades some. )  Leg Pain   The incident occurred more than 1 week ago. The incident occurred at home (around 4/49/20). The injury mechanism was a direct blow (a stacking table fell against her right outer lower leg). The pain is present in the right leg. The quality of the pain is described as aching. The pain is moderate. The pain has been fluctuating since onset. Pertinent negatives include no loss of motion, numbness or tingling. She reports no foreign bodies present. She has tried elevation and immobilization for the symptoms. The treatment provided mild relief.  Hand Pain   Incident onset: noted about 2 months ago. There was no injury mechanism. The pain is present in the left hand. Quality: no pain. The patient is experiencing no pain. The pain has been constant (soft mass just distal to the wrist) since the incident. Pertinent negatives include no chest pain, numbness or tingling.    Review of Systems  Constitutional: Negative for chills, fatigue and fever.  Respiratory: Negative for cough, chest tightness and shortness of breath.   Cardiovascular: Negative for chest pain, palpitations and leg swelling.  Gastrointestinal: Negative for abdominal pain.  Musculoskeletal: Positive for myalgias. Negative for arthralgias and joint swelling.  Skin: Positive for color change (right leg bruise).  Allergic/Immunologic: Negative for environmental allergies.  Neurological: Negative for dizziness, tingling, numbness and headaches.  Psychiatric/Behavioral: Negative for dysphoric mood and sleep disturbance.    Patient Active Problem List   Diagnosis Date  Noted  . Chronic pain of left knee 04/06/2017  . Hyperlipidemia 04/26/2016  . Vitamin D deficiency 04/26/2016  . BPPV (benign paroxysmal positional vertigo) 04/26/2016  . Varicose veins 04/26/2016  . GERD (gastroesophageal reflux disease) 01/04/2015  . Osteoporosis 01/04/2015  . Diffuse arthralgia 01/04/2015  . Lateral epicondylitis of right elbow 01/04/2015    Allergies  Allergen Reactions  . Amoxicillin Hives  . Hydrocodone Other (See Comments)    headache  . Oxycodone Hcl Rash    Past Surgical History:  Procedure Laterality Date  . COLONOSCOPY WITH PROPOFOL N/A 11/13/2016   Procedure: COLONOSCOPY WITH PROPOFOL;  Surgeon: Lollie Sails, MD;  Location: [facility] ENDOSCOPY;  Service: Endoscopy;  Laterality: N/A;    Social History   Tobacco Use  . Smoking status: Former Smoker    Types: Cigarettes    Last attempt to quit: 10/14/1975    Years since quitting: 43.3  . Smokeless tobacco: Never Used  Substance Use Topics  . Alcohol use: No  . Drug use: No     Medication list has been reviewed and updated.  Current Meds  Medication Sig  . Ascorbic Acid (VITAMIN C) 500 MG CAPS Take 1,000 mg by mouth.  Marland Kitchen aspirin EC 81 MG tablet Take 81 mg by mouth daily as needed (learned that it is good to take everyday).  . Cholecalciferol (VITAMIN D) 2000 units tablet Take 2,000 Units by mouth daily.  . fluticasone (VERAMYST) 27.5 MCG/SPRAY nasal spray Place 2 sprays into the nose daily.  Marland Kitchen ibandronate (BONIVA) 150 MG tablet Take 1 tablet by mouth every  30 (thirty) days.  . naproxen sodium (ALEVE) 220 MG tablet Take 2 tablets (440 mg total) by mouth 2 (two) times daily with a meal.  . simvastatin (ZOCOR) 10 MG tablet Take 10 mg by mouth at bedtime.  . traMADol (ULTRAM) 50 MG tablet Take 50 mg by mouth every 6 (six) hours as needed.  . valACYclovir (VALTREX) 1000 MG tablet Take 1 tablet (1,000 mg total) by mouth 3 (three) times daily.    PHQ 2/9 Scores 01/29/2019 05/22/2018  PHQ - 2 Score 0  0    BP Readings from Last 3 Encounters:  01/29/19 112/78  05/22/18 108/80  10/02/17 122/70    Physical Exam Vitals signs and nursing note reviewed.  Constitutional:      General: She is not in acute distress.    Appearance: She is well-developed.  HENT:     Head: Normocephalic and atraumatic.  Eyes:     Pupils: Pupils are equal, round, and reactive to light.  Neck:     Musculoskeletal: Normal range of motion and neck supple.  Cardiovascular:     Rate and Rhythm: Normal rate and regular rhythm.     Heart sounds: No murmur.  Pulmonary:     Effort: Pulmonary effort is normal. No respiratory distress.     Breath sounds: Normal breath sounds. No wheezing or rhonchi.  Musculoskeletal: Normal range of motion.       Arms:     Comments: Large bruise lateral right calf - warm and tender No cord or Homan's sign  Lymphadenopathy:     Cervical: No cervical adenopathy.  Skin:    General: Skin is warm and dry.     Findings: No rash.  Neurological:     Mental Status: She is alert and oriented to person, place, and time.  Psychiatric:        Behavior: Behavior normal.        Thought Content: Thought content normal.     Wt Readings from Last 3 Encounters:  01/29/19 197 lb (89.4 kg)  05/22/18 196 lb (88.9 kg)  10/02/17 187 lb (84.8 kg)    BP 112/78   Pulse 79   Ht 5\' 11"  (1.803 m)   Wt 197 lb (89.4 kg)   SpO2 95%   BMI 27.48 kg/m   Assessment and Plan: 1. Ganglion cyst of finger of left hand Pt reassured, can try massage If worsening or symptomatic, will refer to Ortho  2. Traumatic hematoma of right lower leg, initial encounter Pt reassured - the bruise will gradually reabsorb; no evidence of DVT May continue ACE wraps for comfort; elevate and use heat as needed  3. Need for vaccination for pneumococcus - Pneumococcal conjugate vaccine 13-valent IM   Partially dictated using Editor, commissioning. Any errors are unintentional.  Halina Maidens, MD [facility]  01/29/2019

## 2019-03-06 ENCOUNTER — Encounter: Payer: Self-pay | Admitting: Internal Medicine

## 2019-03-06 ENCOUNTER — Other Ambulatory Visit: Payer: Self-pay

## 2019-03-06 ENCOUNTER — Ambulatory Visit (INDEPENDENT_AMBULATORY_CARE_PROVIDER_SITE_OTHER): Payer: Medicare HMO | Admitting: Internal Medicine

## 2019-03-06 VITALS — BP 104/68 | HR 76 | Temp 98.4°F | Resp 14 | Ht 71.0 in | Wt 196.0 lb

## 2019-03-06 DIAGNOSIS — R42 Dizziness and giddiness: Secondary | ICD-10-CM

## 2019-03-06 DIAGNOSIS — R0981 Nasal congestion: Secondary | ICD-10-CM

## 2019-03-06 MED ORDER — MECLIZINE HCL 25 MG PO TABS: 25.0000 mg | ORAL_TABLET | Freq: Three times a day (TID) | ORAL | 0 refills | Status: DC | PRN

## 2019-03-06 NOTE — Patient Instructions (Signed)
Sudafed 30 mg - take 1 twice a day as needed for ear congestion  Continue Flonase spray

## 2019-03-06 NOTE — Progress Notes (Signed)
Date:  03/06/2019   Name:  Christine Ray   DOB:  1956/03/03   MRN:  [id number]   Chief Complaint: Ear Pain (X 2 days. L) ear pain and facial pain. Tried meclizine, and nasal spray. Having vertigo. Her meclizine is expired and she wanted to know if a new Rx could be sent in. NO fever, cough, or SOB.)  Otalgia  There is pain in the left ear. This is a new problem. The current episode started yesterday. The problem occurs constantly. The problem has been unchanged. There has been no fever. Pertinent negatives include no coughing, ear discharge, headaches or neck pain. Treatments tried: flonase. The treatment provided moderate relief.  Sinus Problem This is a recurrent problem. The current episode started in the past 7 days. There has been no fever. Associated symptoms include congestion, ear pain and sinus pressure. Pertinent negatives include no chills, coughing, headaches, neck pain or shortness of breath. Past treatments include spray decongestants. The treatment provided moderate relief.    Review of Systems  Constitutional: Negative for chills, fatigue and fever.  HENT: Positive for congestion, ear pain and sinus pressure. Negative for ear discharge and trouble swallowing.   Respiratory: Negative for cough, chest tightness, shortness of breath and wheezing.   Cardiovascular: Negative for chest pain and leg swelling.  Musculoskeletal: Negative for neck pain.  Neurological: Positive for dizziness. Negative for tremors, weakness and headaches.    Patient Active Problem List   Diagnosis Date Noted  . Chronic pain of left knee 04/06/2017  . Hyperlipidemia 04/26/2016  . Vitamin D deficiency 04/26/2016  . BPPV (benign paroxysmal positional vertigo) 04/26/2016  . Varicose veins 04/26/2016  . GERD (gastroesophageal reflux disease) 01/04/2015  . Osteoporosis 01/04/2015  . Diffuse arthralgia 01/04/2015  . Lateral epicondylitis of right elbow 01/04/2015    Allergies  Allergen  Reactions  . Amoxicillin Hives  . Hydrocodone Other (See Comments)    headache  . Oxycodone Hcl Rash    Past Surgical History:  Procedure Laterality Date  . COLONOSCOPY WITH PROPOFOL N/A 11/13/2016   Procedure: COLONOSCOPY WITH PROPOFOL;  Surgeon: Lollie Sails, MD;  Location: [facility] ENDOSCOPY;  Service: Endoscopy;  Laterality: N/A;    Social History   Tobacco Use  . Smoking status: Former Smoker    Types: Cigarettes    Quit date: 10/14/1975    Years since quitting: 43.4  . Smokeless tobacco: Never Used  Substance Use Topics  . Alcohol use: No  . Drug use: No     Medication list has been reviewed and updated.  No outpatient medications have been marked as taking for the 03/06/19 encounter (Office Visit) with Glean Hess, MD.    [facility] 2/9 Scores 01/29/2019 05/22/2018  PHQ - 2 Score 0 0    BP Readings from Last 3 Encounters:  03/06/19 104/68  01/29/19 112/78  05/22/18 108/80    Physical Exam Constitutional:      Appearance: She is well-developed.  HENT:     Right Ear: Ear canal and external ear normal. No middle ear effusion. Tympanic membrane is retracted. Tympanic membrane is not erythematous.     Left Ear: Ear canal and external ear normal.  No middle ear effusion. Tympanic membrane is retracted. Tympanic membrane is not erythematous.     Nose:     Right Sinus: No maxillary sinus tenderness or frontal sinus tenderness.     Left Sinus: Maxillary sinus tenderness present. No frontal sinus tenderness.  Eyes:  Extraocular Movements:     Right eye: Nystagmus present.     Left eye: Nystagmus present.  Neck:     Musculoskeletal: Normal range of motion.  Cardiovascular:     Rate and Rhythm: Normal rate and regular rhythm.     Heart sounds: Normal heart sounds. No murmur.  Pulmonary:     Breath sounds: Normal breath sounds. No wheezing, rhonchi or rales.  Lymphadenopathy:     Cervical: No cervical adenopathy.  Neurological:     Mental Status: She is alert  and oriented to person, place, and time.     Wt Readings from Last 3 Encounters:  03/06/19 196 lb (88.9 kg)  01/29/19 197 lb (89.4 kg)  05/22/18 196 lb (88.9 kg)    BP 104/68   Pulse 76   Temp 98.4 F (36.9 C) (Oral)   Resp 14   Ht 5\' 11"  (1.803 m)   Wt 196 lb (88.9 kg)   SpO2 97%   BMI 27.34 kg/m   Assessment and Plan: 1. Sinus congestion Continue Flonase, add Sudafed No evidence of sinus infection  2. Vertigo Use meclizine as needed - meclizine (ANTIVERT) 25 MG tablet; Take 1 tablet (25 mg total) by mouth 3 (three) times daily as needed for dizziness.  Dispense: 30 tablet; Refill: 0   Partially dictated using Editor, commissioning. Any errors are unintentional.  Halina Maidens, MD [facility]  03/06/2019

## 2019-03-07 ENCOUNTER — Ambulatory Visit: Payer: Medicare HMO | Admitting: Internal Medicine

## 2019-04-14 DIAGNOSIS — H401131 Primary open-angle glaucoma, bilateral, mild stage: Secondary | ICD-10-CM | POA: Diagnosis not present

## 2019-05-22 DIAGNOSIS — H1045 Other chronic allergic conjunctivitis: Secondary | ICD-10-CM | POA: Diagnosis not present

## 2019-06-03 ENCOUNTER — Other Ambulatory Visit: Payer: Self-pay | Admitting: Obstetrics and Gynecology

## 2019-06-03 DIAGNOSIS — Z1231 Encounter for screening mammogram for malignant neoplasm of breast: Secondary | ICD-10-CM

## 2019-06-13 ENCOUNTER — Ambulatory Visit (INDEPENDENT_AMBULATORY_CARE_PROVIDER_SITE_OTHER): Payer: Medicare HMO

## 2019-06-13 ENCOUNTER — Other Ambulatory Visit: Payer: Self-pay

## 2019-06-13 DIAGNOSIS — Z23 Encounter for immunization: Secondary | ICD-10-CM | POA: Diagnosis not present

## 2019-06-18 DIAGNOSIS — G8929 Other chronic pain: Secondary | ICD-10-CM | POA: Diagnosis not present

## 2019-06-18 DIAGNOSIS — Z01419 Encounter for gynecological examination (general) (routine) without abnormal findings: Secondary | ICD-10-CM | POA: Diagnosis not present

## 2019-06-18 DIAGNOSIS — M25561 Pain in right knee: Secondary | ICD-10-CM | POA: Diagnosis not present

## 2019-06-18 DIAGNOSIS — N3281 Overactive bladder: Secondary | ICD-10-CM | POA: Diagnosis not present

## 2019-06-18 DIAGNOSIS — M25562 Pain in left knee: Secondary | ICD-10-CM | POA: Diagnosis not present

## 2019-06-18 LAB — HEPATIC FUNCTION PANEL
ALT: 15 (ref 7–35)
AST: 18 (ref 13–35)

## 2019-06-18 LAB — LIPID PANEL
Cholesterol: 190 (ref 0–200)
HDL: 52 (ref 35–70)
LDL Cholesterol: 120

## 2019-06-18 LAB — CBC AND DIFFERENTIAL
HCT: 43 (ref 36–46)
Hemoglobin: 13.8 (ref 12.0–16.0)
Platelets: 185 (ref 150–399)
WBC: 7.1

## 2019-06-18 LAB — BASIC METABOLIC PANEL
BUN: 14 (ref 4–21)
Creatinine: 0.9 (ref 0.5–1.1)

## 2019-06-20 ENCOUNTER — Encounter: Payer: Self-pay | Admitting: Internal Medicine

## 2019-06-20 ENCOUNTER — Other Ambulatory Visit: Payer: Self-pay

## 2019-06-20 ENCOUNTER — Ambulatory Visit (INDEPENDENT_AMBULATORY_CARE_PROVIDER_SITE_OTHER): Payer: Medicare HMO | Admitting: Internal Medicine

## 2019-06-20 VITALS — BP 122/72 | HR 73 | Ht 71.0 in | Wt 197.0 lb

## 2019-06-20 DIAGNOSIS — M7651 Patellar tendinitis, right knee: Secondary | ICD-10-CM | POA: Diagnosis not present

## 2019-06-20 DIAGNOSIS — M7652 Patellar tendinitis, left knee: Secondary | ICD-10-CM | POA: Diagnosis not present

## 2019-06-20 MED ORDER — NAPROXEN 500 MG PO TABS: 500.0000 mg | ORAL_TABLET | Freq: Two times a day (BID) | ORAL | 0 refills | Status: DC

## 2019-06-20 NOTE — Progress Notes (Signed)
Date:  06/20/2019   Name:  Christine Ray   DOB:  September 02, 1956   MRN:  [id number]   Chief Complaint: Knee Pain (Bilateral knee pain- aching pains on and off. Feels like can swell on and off. Sitting long periods of time can cause knees to become stiff and hard to stand. )  Knee Pain  There was no injury mechanism. The pain is present in the left knee and right knee. The quality of the pain is described as aching. The patient is experiencing no pain (just stiffness). The pain has been intermittent since onset. She reports no foreign bodies present. Exacerbated by: movement after prolonged sitting.   Lab Results  Component Value Date   WBC 7.1 06/18/2019   HGB 13.8 06/18/2019   HCT 43 06/18/2019   MCV 83.7 03/27/2017   PLT 185 06/18/2019   Lab Results  Component Value Date   CREATININE 0.9 06/18/2019   BUN 14 06/18/2019   NA 141 03/27/2017   K 3.7 03/27/2017   CL 104 03/27/2017   CO2 31 03/27/2017   Lab Results  Component Value Date   CHOL 190 06/18/2019   HDL 52 06/18/2019   LDLCALC 120 06/18/2019   TRIG 106 06/17/2018   CHOLHDL 4.1 10/27/2016     Review of Systems  Constitutional: Negative for chills, fatigue and fever.  Respiratory: Negative for chest tightness and shortness of breath.   Cardiovascular: Negative for chest pain, palpitations and leg swelling.  Gastrointestinal: Negative for abdominal pain.  Musculoskeletal: Positive for arthralgias and gait problem. Negative for joint swelling and myalgias.  Skin: Negative for rash.  Neurological: Negative for dizziness and headaches.  Psychiatric/Behavioral: Negative for sleep disturbance.    Patient Active Problem List   Diagnosis Date Noted  . Chronic pain of left knee 04/06/2017  . Hyperlipidemia 04/26/2016  . Vitamin D deficiency 04/26/2016  . BPPV (benign paroxysmal positional vertigo) 04/26/2016  . Varicose veins 04/26/2016  . GERD (gastroesophageal reflux disease) 01/04/2015  . Osteoporosis  01/04/2015  . Diffuse arthralgia 01/04/2015  . Lateral epicondylitis of right elbow 01/04/2015    Allergies  Allergen Reactions  . Amoxicillin Hives  . Hydrocodone Other (See Comments)    headache  . Oxycodone Hcl Rash    Past Surgical History:  Procedure Laterality Date  . COLONOSCOPY WITH PROPOFOL N/A 11/13/2016   Procedure: COLONOSCOPY WITH PROPOFOL;  Surgeon: Lollie Sails, MD;  Location: [facility] ENDOSCOPY;  Service: Endoscopy;  Laterality: N/A;    Social History   Tobacco Use  . Smoking status: Former Smoker    Types: Cigarettes    Quit date: 10/14/1975    Years since quitting: 43.7  . Smokeless tobacco: Never Used  Substance Use Topics  . Alcohol use: No  . Drug use: No     Medication list has been reviewed and updated.  Current Meds  Medication Sig  . Ascorbic Acid (VITAMIN C) 500 MG CAPS Take 1,000 mg by mouth.  Marland Kitchen aspirin EC 81 MG tablet Take 81 mg by mouth daily as needed (learned that it is good to take everyday).  . Cholecalciferol (VITAMIN D) 2000 units tablet Take 2,000 Units by mouth daily.  . fluticasone (VERAMYST) 27.5 MCG/SPRAY nasal spray Place 2 sprays into the nose daily.  Marland Kitchen ibandronate (BONIVA) 150 MG tablet Take 1 tablet by mouth every 30 (thirty) days.  . meclizine (ANTIVERT) 25 MG tablet Take 1 tablet (25 mg total) by mouth 3 (three) times daily as needed for  dizziness.  . naproxen sodium (ALEVE) 220 MG tablet Take 2 tablets (440 mg total) by mouth 2 (two) times daily with a meal.  . simvastatin (ZOCOR) 10 MG tablet Take 10 mg by mouth at bedtime.  . traMADol (ULTRAM) 50 MG tablet Take 50 mg by mouth every 6 (six) hours as needed.  . valACYclovir (VALTREX) 1000 MG tablet Take 1 tablet (1,000 mg total) by mouth 3 (three) times daily.    PHQ 2/9 Scores 06/20/2019 01/29/2019 05/22/2018  PHQ - 2 Score 0 0 0    BP Readings from Last 3 Encounters:  06/20/19 122/72  03/06/19 104/68  01/29/19 112/78    Physical Exam Vitals signs and nursing note  reviewed.  Constitutional:      General: She is not in acute distress.    Appearance: Normal appearance. She is well-developed.  HENT:     Head: Normocephalic and atraumatic.  Cardiovascular:     Rate and Rhythm: Normal rate and regular rhythm.     Pulses:          Dorsalis pedis pulses are 2+ on the right side and 2+ on the left side.       Posterior tibial pulses are 2+ on the right side and 2+ on the left side.  Pulmonary:     Effort: Pulmonary effort is normal. No respiratory distress.     Breath sounds: Normal breath sounds.  Musculoskeletal:     Right knee: She exhibits normal range of motion, no swelling and no effusion. Tenderness found. Patellar tendon tenderness noted.     Left knee: She exhibits normal range of motion, no swelling and no effusion. Tenderness found. Patellar tendon tenderness noted.     Right lower leg: No edema.     Left lower leg: No edema.  Skin:    General: Skin is warm and dry.     Findings: No rash.  Neurological:     Mental Status: She is alert and oriented to person, place, and time.  Psychiatric:        Behavior: Behavior normal.        Thought Content: Thought content normal.     Wt Readings from Last 3 Encounters:  06/20/19 197 lb (89.4 kg)  03/06/19 196 lb (88.9 kg)  01/29/19 197 lb (89.4 kg)    BP 122/72   Pulse 73   Ht 5\' 11"  (1.803 m)   Wt 197 lb (89.4 kg)   SpO2 96%   BMI 27.48 kg/m   Assessment and Plan: 1. Patellar tendonitis of both knees Aleve 220 mg 2 bid until Rx arrives Ice for 10 minutes twice a day Begin a gradual walking program and ice knees afterwards - naproxen (NAPROSYN) 500 MG tablet; Take 1 tablet (500 mg total) by mouth 2 (two) times daily with a meal.  Dispense: 180 tablet; Refill: 0   Partially dictated using Editor, commissioning. Any errors are unintentional.  Halina Maidens, MD [facility]  06/20/2019

## 2019-07-09 ENCOUNTER — Ambulatory Visit
Admission: RE | Admit: 2019-07-09 | Discharge: 2019-07-09 | Disposition: A | Discharge status: Home / Self Care | Payer: Medicare HMO | Source: Ambulatory Visit | Attending: Obstetrics and Gynecology | Admitting: Obstetrics and Gynecology

## 2019-07-09 DIAGNOSIS — Z1231 Encounter for screening mammogram for malignant neoplasm of breast: Secondary | ICD-10-CM | POA: Diagnosis not present

## 2019-07-16 ENCOUNTER — Telehealth: Payer: Self-pay | Admitting: Internal Medicine

## 2019-07-16 NOTE — Telephone Encounter (Signed)
Pt needs refill sent in to [facility]   fluticasone (VERAMYST) 27.5 MCG/SPRAY nasal spray [id number]

## 2019-07-17 ENCOUNTER — Other Ambulatory Visit: Payer: Self-pay

## 2019-07-17 MED ORDER — FLUTICASONE FUROATE 27.5 MCG/SPRAY NA SUSP: 2.0000 | Freq: Every day | NASAL | 5 refills | Status: DC

## 2019-07-17 NOTE — Telephone Encounter (Signed)
Sent in Refill. Thank you!

## 2019-07-22 ENCOUNTER — Other Ambulatory Visit: Payer: Self-pay

## 2019-07-22 ENCOUNTER — Other Ambulatory Visit: Payer: Self-pay | Admitting: Internal Medicine

## 2019-07-22 MED ORDER — FLUTICASONE PROPIONATE 50 MCG/ACT NA SUSP: 2.0000 | Freq: Every day | NASAL | 12 refills | Status: DC

## 2019-07-22 NOTE — Progress Notes (Signed)
Received fax from [employer] order stating that Christine Ray is not covered by her insurance. She will need to either buy this OTC or we can send in flonase nasal spray.   Called and spoke with patient and she said flonase does not work as good for her but she would rather have flonase sent into [facility] so she does not need to buy the other OTC.  Sent in flonase to [facility] and patient informed.

## 2019-10-15 DIAGNOSIS — H16223 Keratoconjunctivitis sicca, not specified as Sjogren's, bilateral: Secondary | ICD-10-CM | POA: Diagnosis not present

## 2019-10-21 ENCOUNTER — Other Ambulatory Visit: Payer: Self-pay

## 2019-10-21 ENCOUNTER — Telehealth: Payer: Self-pay | Admitting: Internal Medicine

## 2019-10-21 DIAGNOSIS — M7651 Patellar tendinitis, right knee: Secondary | ICD-10-CM

## 2019-10-21 DIAGNOSIS — R42 Dizziness and giddiness: Secondary | ICD-10-CM

## 2019-10-21 MED ORDER — MECLIZINE HCL 25 MG PO TABS: 25.0000 mg | ORAL_TABLET | Freq: Three times a day (TID) | ORAL | 0 refills | Status: DC | PRN

## 2019-10-21 MED ORDER — NAPROXEN 500 MG PO TABS: 500.0000 mg | ORAL_TABLET | Freq: Two times a day (BID) | ORAL | 0 refills | Status: DC

## 2019-10-21 NOTE — Telephone Encounter (Signed)
Sent in medication refills. Thank you.

## 2019-10-21 NOTE — Telephone Encounter (Signed)
Patient would like this medicine to be sent over through [facility] mail delivery order.   meclizine (ANTIVERT) 25 MG tablet [id number]  naproxen (NAPROSYN) 500 MG tablet

## 2019-11-30 ENCOUNTER — Emergency Department (HOSPITAL_COMMUNITY): Payer: Medicare HMO

## 2019-11-30 ENCOUNTER — Encounter (HOSPITAL_COMMUNITY): Payer: Self-pay | Admitting: Emergency Medicine

## 2019-11-30 ENCOUNTER — Other Ambulatory Visit: Payer: Self-pay

## 2019-11-30 ENCOUNTER — Emergency Department (HOSPITAL_COMMUNITY)
Admission: EM | Admit: 2019-11-30 | Discharge: 2019-12-01 | Disposition: A | Discharge status: Home / Self Care | Payer: Medicare HMO | Attending: Emergency Medicine | Admitting: Emergency Medicine

## 2019-11-30 DIAGNOSIS — Z79899 Other long term (current) drug therapy: Secondary | ICD-10-CM | POA: Diagnosis not present

## 2019-11-30 DIAGNOSIS — R1031 Right lower quadrant pain: Secondary | ICD-10-CM | POA: Insufficient documentation

## 2019-11-30 DIAGNOSIS — R109 Unspecified abdominal pain: Secondary | ICD-10-CM | POA: Diagnosis not present

## 2019-11-30 DIAGNOSIS — N390 Urinary tract infection, site not specified: Secondary | ICD-10-CM | POA: Diagnosis not present

## 2019-11-30 DIAGNOSIS — R197 Diarrhea, unspecified: Secondary | ICD-10-CM | POA: Diagnosis not present

## 2019-11-30 DIAGNOSIS — Z87891 Personal history of nicotine dependence: Secondary | ICD-10-CM | POA: Insufficient documentation

## 2019-11-30 LAB — CBC WITH DIFFERENTIAL/PLATELET
Abs Immature Granulocytes: 0.01 10*3/uL (ref 0.00–0.07)
Basophils Absolute: 0 10*3/uL (ref 0.0–0.1)
Basophils Relative: 0 %
Eosinophils Absolute: 0.3 10*3/uL (ref 0.0–0.5)
Eosinophils Relative: 3 %
HCT: 44.1 % (ref 36.0–46.0)
Hemoglobin: 13.7 g/dL (ref 12.0–15.0)
Immature Granulocytes: 0 %
Lymphocytes Relative: 37 %
Lymphs Abs: 3.4 10*3/uL (ref 0.7–4.0)
MCH: 27 pg (ref 26.0–34.0)
MCHC: 31.1 g/dL (ref 30.0–36.0)
MCV: 86.8 fL (ref 80.0–100.0)
Monocytes Absolute: 0.8 10*3/uL (ref 0.1–1.0)
Monocytes Relative: 9 %
Neutro Abs: 4.8 10*3/uL (ref 1.7–7.7)
Neutrophils Relative %: 51 %
Platelets: 191 10*3/uL (ref 150–400)
RBC: 5.08 MIL/uL (ref 3.87–5.11)
RDW: 14.2 % (ref 11.5–15.5)
WBC: 9.3 10*3/uL (ref 4.0–10.5)
nRBC: 0 % (ref 0.0–0.2)

## 2019-11-30 LAB — BASIC METABOLIC PANEL
Anion gap: 8 (ref 5–15)
BUN: 19 mg/dL (ref 8–23)
CO2: 27 mmol/L (ref 22–32)
Calcium: 8.6 mg/dL — ABNORMAL LOW (ref 8.9–10.3)
Chloride: 105 mmol/L (ref 98–111)
Creatinine, Ser: 0.88 mg/dL (ref 0.44–1.00)
GFR calc Af Amer: >60 mL/min (ref >60)
GFR calc non Af Amer: >60 mL/min (ref >60)
Glucose, Bld: 91 mg/dL (ref 70–99)
Potassium: 3.8 mmol/L (ref 3.5–5.1)
Sodium: 140 mmol/L (ref 135–145)

## 2019-11-30 MED ORDER — SODIUM CHLORIDE 0.9 % IV BOLUS
500.0000 mL | Freq: Once | INTRAVENOUS | Status: AC
  Administered 2019-11-30: 23:00:00 500 mL via INTRAVENOUS

## 2019-11-30 MED ORDER — IOHEXOL 300 MG/ML  SOLN
100.0000 mL | Freq: Once | INTRAMUSCULAR | Status: AC | PRN
  Administered 2019-11-30: 100 mL via INTRAVENOUS

## 2019-11-30 NOTE — ED Triage Notes (Signed)
Pt c/o RLQ pain for the past two days, with loose stools. Reports foul smelling urine.

## 2019-11-30 NOTE — ED Provider Notes (Signed)
[facility] EMERGENCY DEPARTMENT Provider Note   CSN: [id number] Arrival date & time: 11/30/19  2242     History Chief Complaint  Patient presents with  . Abdominal Pain    Christine Ray is a 64 y.o. female.  Patient presents to the emergency department for evaluation of 2 days of abdominal pain.  Pain is predominantly right lower quadrants.  She has not had nausea or vomiting but has had diarrhea.  No rectal bleeding or melena.         Past Medical History:  Diagnosis Date  . Hyperlipidemia     Patient Active Problem List   Diagnosis Date Noted  . Chronic pain of left knee 04/06/2017  . Hyperlipidemia 04/26/2016  . Vitamin D deficiency 04/26/2016  . BPPV (benign paroxysmal positional vertigo) 04/26/2016  . Varicose veins 04/26/2016  . GERD (gastroesophageal reflux disease) 01/04/2015  . Osteoporosis 01/04/2015  . Diffuse arthralgia 01/04/2015  . Lateral epicondylitis of right elbow 01/04/2015    Past Surgical History:  Procedure Laterality Date  . COLONOSCOPY WITH PROPOFOL N/A 11/13/2016   Procedure: COLONOSCOPY WITH PROPOFOL;  Surgeon: Lollie Sails, MD;  Location: [facility] ENDOSCOPY;  Service: Endoscopy;  Laterality: N/A;     OB History   No obstetric history on file.     Family History  Problem Relation Age of Onset  . Breast cancer Mother 54  . Prostate cancer Father   . Throat cancer Brother     Social History   Tobacco Use  . Smoking status: Former Smoker    Types: Cigarettes    Quit date: 10/14/1975    Years since quitting: 44.1  . Smokeless tobacco: Never Used  Substance Use Topics  . Alcohol use: No  . Drug use: No    Home Medications Prior to Admission medications   Medication Sig Start Date End Date Taking? Authorizing Provider  Ascorbic Acid (VITAMIN C) 500 MG CAPS Take 1,000 mg by mouth.    [provider]  aspirin EC 81 MG tablet Take 81 mg by mouth daily as needed (learned that it is good to take everyday).     [provider]  Cholecalciferol (VITAMIN D) 2000 units tablet Take 2,000 Units by mouth daily.    [provider]  fluticasone (FLONASE) 50 MCG/ACT nasal spray Place 2 sprays into both nostrils daily. 07/22/19   Glean Hess, MD  ibandronate (BONIVA) 150 MG tablet Take 1 tablet by mouth every 30 (thirty) days. 12/18/18   [provider]  meclizine (ANTIVERT) 25 MG tablet Take 1 tablet (25 mg total) by mouth 3 (three) times daily as needed for dizziness. 10/21/19   Glean Hess, MD  metroNIDAZOLE (FLAGYL) 500 MG tablet Take 1 tablet (500 mg total) by mouth 3 (three) times daily. 12/01/19   Orpah Greek, MD  naproxen (NAPROSYN) 500 MG tablet Take 1 tablet (500 mg total) by mouth 2 (two) times daily with a meal. 10/21/19   Glean Hess, MD  naproxen sodium (ALEVE) 220 MG tablet Take 2 tablets (440 mg total) by mouth 2 (two) times daily with a meal. 10/02/17   Glean Hess, MD  simvastatin (ZOCOR) 10 MG tablet Take 10 mg by mouth at bedtime. 09/27/17   [provider]  sulfamethoxazole-trimethoprim (BACTRIM DS) 800-160 MG tablet Take 1 tablet by mouth 2 (two) times daily. 12/01/19   Orpah Greek, MD  traMADol (ULTRAM) 50 MG tablet Take 1 tablet (50 mg total) by mouth every  6 (six) hours as needed. 12/01/19   Orpah Greek, MD  valACYclovir (VALTREX) 1000 MG tablet Take 1 tablet (1,000 mg total) by mouth 3 (three) times daily. 10/02/17   Glean Hess, MD    Allergies    Amoxicillin, Hydrocodone, and Oxycodone hcl  Review of Systems   Review of Systems  Gastrointestinal: Positive for abdominal pain and diarrhea.  All other systems reviewed and are negative.   Physical Exam Updated Vital Signs BP (!) 109/57 (BP Location: Right Arm)   Pulse 64   Temp 98 F (36.7 C) (Oral)   Resp 16   Ht 5\' 11"  (1.803 m)   Wt 86.2 kg   SpO2 97%   BMI 26.50 kg/m   Physical Exam Vitals and nursing note reviewed.  Constitutional:       General: She is not in acute distress.    Appearance: Normal appearance. She is well-developed.  HENT:     Head: Normocephalic and atraumatic.     Right Ear: Hearing normal.     Left Ear: Hearing normal.     Nose: Nose normal.  Eyes:     Conjunctiva/sclera: Conjunctivae normal.     Pupils: Pupils are equal, round, and reactive to light.  Cardiovascular:     Rate and Rhythm: Regular rhythm.     Heart sounds: S1 normal and S2 normal. No murmur. No friction rub. No gallop.   Pulmonary:     Effort: Pulmonary effort is normal. No respiratory distress.     Breath sounds: Normal breath sounds.  Chest:     Chest wall: No tenderness.  Abdominal:     General: Bowel sounds are normal.     Palpations: Abdomen is soft.     Tenderness: There is abdominal tenderness in the right lower quadrant. There is guarding. There is no rebound. Negative signs include Murphy's sign and McBurney's sign.     Hernia: No hernia is present.  Musculoskeletal:        General: Normal range of motion.     Cervical back: Normal range of motion and neck supple.  Skin:    General: Skin is warm and dry.     Findings: No rash.  Neurological:     Mental Status: She is alert and oriented to person, place, and time.     GCS: GCS eye subscore is 4. GCS verbal subscore is 5. GCS motor subscore is 6.     Cranial Nerves: No cranial nerve deficit.     Sensory: No sensory deficit.     Coordination: Coordination normal.  Psychiatric:        Speech: Speech normal.        Behavior: Behavior normal.        Thought Content: Thought content normal.     ED Results / Procedures / Treatments   Labs (all labs ordered are listed, but only abnormal results are displayed) Labs Reviewed  BASIC METABOLIC PANEL - Abnormal; Notable for the following components:      Result Value   Calcium 8.6 (*)    All other components within normal limits  URINALYSIS, ROUTINE W REFLEX MICROSCOPIC - Abnormal; Notable for the following  components:   APPearance HAZY (*)    Specific Gravity, Urine 1.041 (*)    Hgb urine dipstick MODERATE (*)    Leukocytes,Ua SMALL (*)    Bacteria, UA RARE (*)    All other components within normal limits  URINE CULTURE  CBC WITH DIFFERENTIAL/PLATELET  EKG None  Radiology CT ABDOMEN PELVIS W CONTRAST  Result Date: 12/01/2019 CLINICAL DATA:  Right lower quadrant abdominal pain EXAM: CT ABDOMEN AND PELVIS WITH CONTRAST TECHNIQUE: Multidetector CT imaging of the abdomen and pelvis was performed using the standard protocol following bolus administration of intravenous contrast. CONTRAST:  133mL OMNIPAQUE IOHEXOL 300 MG/ML  SOLN COMPARISON:  None FINDINGS: Lower chest: Lung bases are clear. Normal heart size. No pericardial effusion. Hepatobiliary: Several large fluid attenuation cystic lesions present in the liver measuring up to 4.5 cm in the right lobe and 4 cm in the left lobe liver. Additional smaller subcentimeter hypoattenuating foci present throughout both lobes too small to fully characterize on CT imaging but statistically likely benign. Gallbladder is largely decompressed at the time of exam. Mild gallbladder wall thickening is present. Small amount of adjacent pericholecystic inflammation is favored to be reactive to a predominately colonic process detailed below. No biliary ductal dilatation. Pancreas: Unremarkable. No pancreatic ductal dilatation or surrounding inflammatory changes. Spleen: Normal in size without focal abnormality. Adrenals/Urinary Tract: Adrenal glands are unremarkable. Kidneys are normal, without renal calculi, focal lesion, or hydronephrosis. Bladder is unremarkable. Stomach/Bowel: Distal esophagus, stomach and duodenal sweep are unremarkable. There are multiple clustered loops of edematous appearing small bowel in the low midline abdomen with adjacent reactive nodes in the mesentery. Distal ileum also appears mildly thickened with some adjacent ileal stranding.  Remaining portions of the small bowel have a more normal appearance. The cecum is fairly bland in appearance. The appendix is not well visualized. No focal pericecal inflammation is seen. However, there is segmental edematous mural thickening and pericolonic stranding involving the distal ascending, transverse, and proximal descending colon most notably at the level of the hepatic flexure. No pneumatosis, extraluminal gas, organized fluid or abscess is evident. Likely reactive low-attenuation fluid is layering in the pericolic gutter on the right. Vascular/Lymphatic: The aorta is normal caliber. Extensive reactive appearing mesenteric adenopathy, as detailed above. No pathologically enlarged nodes in the abdomen or pelvis. Reproductive: Anteverted uterus with several pedunculated partially calcified subserosal fibroids at the uterine fundus. No other concerning adnexal lesions. Other: Small volume free fluid in the right pericolic gutter and pelvis is favored to be reactive (low-attenuation at -2 Hounsfield units). No abdominopelvic free air, organized collection or abscess. No bowel containing hernias. Musculoskeletal: Multilevel degenerative changes are present in the imaged portions of the spine. No acute osseous abnormality or suspicious osseous lesion. IMPRESSION: 1. Segmental edematous mural thickening and pericolonic stranding involving the distal ascending, transverse, and proximal descending colon most notably at the level of the hepatic flexure as well some clustered, hyperemic appearing small bowel loops in the low mid abdomen is suggestive of an infectious or inflammatory enterocolitis. No pneumatosis, extraluminal gas, organized fluid or abscess is evident. 2. Small volume free fluid in the right pericolic gutter and pelvis is favored to be reactive to the colonic process. 3. Multiple large hepatic cysts measuring up to 4.5 cm in the right lobe and 4 cm in the left lobe liver. No worrisome imaging  features. 4. Fibroid uterus. Electronically Signed   By: Lovena Le M.D.   On: 12/01/2019 00:54    Procedures Procedures (including critical care time)  Medications Ordered in ED Medications  sulfamethoxazole-trimethoprim (BACTRIM DS) 800-160 MG per tablet 1 tablet (has no administration in time range)  metroNIDAZOLE (FLAGYL) tablet 500 mg (has no administration in time range)  sodium chloride 0.9 % bolus 500 mL (0 mLs Intravenous Stopped 12/01/19 0017)  iohexol (  OMNIPAQUE) 300 MG/ML solution 100 mL (100 mLs Intravenous Contrast Given 11/30/19 2353)    ED Course  I have reviewed the triage vital signs and the nursing notes.  Pertinent labs & imaging results that were available during my care of the patient were reviewed by me and considered in my medical decision making (see chart for details).    MDM Rules/Calculators/A&P                      Patient presents with complaints of lower abdominal pain for 2 days.  She has had some diarrhea.  Pain is predominantly in the right lower abdomen.  She does have some tenderness and mild guarding without signs of rebound.  Lab work reveals normal white blood cell count and electrolytes.  Urinalysis does suggest infection.  Patient underwent CT scan to further evaluate.  She does have diffuse bowel inflammation.  Will treat with Bactrim and Flagyl to cover for urinary pathogens as well as bowel and follow-up with PCP and GI.  Final Clinical Impression(s) / ED Diagnoses Final diagnoses:  Right lower quadrant abdominal pain  Lower urinary tract infectious disease    Rx / DC Orders ED Discharge Orders         Ordered    sulfamethoxazole-trimethoprim (BACTRIM DS) 800-160 MG tablet  2 times daily     12/01/19 0117    metroNIDAZOLE (FLAGYL) 500 MG tablet  3 times daily     12/01/19 0117    traMADol (ULTRAM) 50 MG tablet  Every 6 hours PRN     12/01/19 0117           Orpah Greek, MD 12/01/19 [phone]

## 2019-12-01 ENCOUNTER — Ambulatory Visit: Payer: Medicare HMO | Admitting: Internal Medicine

## 2019-12-01 DIAGNOSIS — R109 Unspecified abdominal pain: Secondary | ICD-10-CM | POA: Diagnosis not present

## 2019-12-01 LAB — URINALYSIS, ROUTINE W REFLEX MICROSCOPIC
Bilirubin Urine: NEGATIVE
Glucose, UA: NEGATIVE mg/dL
Ketones, ur: NEGATIVE mg/dL
Nitrite: NEGATIVE
Protein, ur: NEGATIVE mg/dL
Specific Gravity, Urine: 1.041 — ABNORMAL HIGH (ref 1.005–1.030)
pH: 5 (ref 5.0–8.0)

## 2019-12-01 MED ORDER — METRONIDAZOLE 500 MG PO TABS
500.0000 mg | ORAL_TABLET | Freq: Once | ORAL | Status: AC
  Administered 2019-12-01: 500 mg via ORAL
  Filled 2019-12-01: qty 1

## 2019-12-01 MED ORDER — SULFAMETHOXAZOLE-TRIMETHOPRIM 800-160 MG PO TABS: 1.0000 | ORAL_TABLET | Freq: Two times a day (BID) | ORAL | 0 refills | Status: DC

## 2019-12-01 MED ORDER — TRAMADOL HCL 50 MG PO TABS: 50.0000 mg | ORAL_TABLET | Freq: Four times a day (QID) | ORAL | 0 refills | Status: DC | PRN

## 2019-12-01 MED ORDER — METRONIDAZOLE 500 MG PO TABS: 500.0000 mg | ORAL_TABLET | Freq: Three times a day (TID) | ORAL | 0 refills | Status: DC

## 2019-12-01 MED ORDER — SULFAMETHOXAZOLE-TRIMETHOPRIM 800-160 MG PO TABS
1.0000 | ORAL_TABLET | Freq: Once | ORAL | Status: AC
  Administered 2019-12-01: 1 via ORAL
  Filled 2019-12-01: qty 1

## 2019-12-01 MED ORDER — TRAMADOL HCL 50 MG PO TABS
50.0000 mg | ORAL_TABLET | Freq: Once | ORAL | Status: AC
  Administered 2019-12-01: 50 mg via ORAL
  Filled 2019-12-01: qty 1

## 2019-12-01 NOTE — Discharge Instructions (Addendum)
Schedule follow-up with your primary doctor and your gastroenterologist.  If your symptoms worsen, return to the ER.

## 2019-12-02 ENCOUNTER — Encounter: Payer: Self-pay | Admitting: Internal Medicine

## 2019-12-02 ENCOUNTER — Ambulatory Visit (INDEPENDENT_AMBULATORY_CARE_PROVIDER_SITE_OTHER): Payer: Medicare HMO | Admitting: Internal Medicine

## 2019-12-02 ENCOUNTER — Other Ambulatory Visit
Admission: RE | Admit: 2019-12-02 | Discharge: 2019-12-02 | Disposition: A | Discharge status: Home / Self Care | Payer: Medicare HMO | Attending: Internal Medicine | Admitting: Internal Medicine

## 2019-12-02 ENCOUNTER — Other Ambulatory Visit: Payer: Self-pay

## 2019-12-02 VITALS — BP 106/72 | HR 68 | Temp 97.5°F | Ht 71.0 in | Wt 208.0 lb

## 2019-12-02 DIAGNOSIS — R635 Abnormal weight gain: Secondary | ICD-10-CM | POA: Diagnosis not present

## 2019-12-02 DIAGNOSIS — N3000 Acute cystitis without hematuria: Secondary | ICD-10-CM | POA: Diagnosis not present

## 2019-12-02 DIAGNOSIS — M170 Bilateral primary osteoarthritis of knee: Secondary | ICD-10-CM

## 2019-12-02 DIAGNOSIS — K529 Noninfective gastroenteritis and colitis, unspecified: Secondary | ICD-10-CM | POA: Diagnosis not present

## 2019-12-02 LAB — URINE CULTURE: Culture: <10000 — AB

## 2019-12-02 LAB — TSH: TSH: 0.566 u[IU]/mL (ref 0.350–4.500)

## 2019-12-02 LAB — T4, FREE: Free T4: 0.73 ng/dL (ref 0.61–1.12)

## 2019-12-02 NOTE — Patient Instructions (Signed)
Voltaren gel over the counter for knee pain

## 2019-12-02 NOTE — Progress Notes (Signed)
Date:  12/02/2019   Name:  Christine Ray   DOB:  04-Mar-1956   MRN:  [id number]   Chief Complaint: Urinary Tract Infection [facility] to [facility] Sunday night. Was having severe pelvic pain. Taking bactrim and flagyl. ) and Leg Pain (L) leg pain behind knee- feels a lump behind her knee. Both legs swell on the inside of the knees and become sore. Thinks she may have fluid build up on and off. )  Urinary Tract Infection  This is a new problem. The current episode started in the past 7 days. The problem has been unchanged. The quality of the pain is described as burning. The pain is mild. There has been no fever.  Abdominal Pain This is a new problem. The current episode started in the past 7 days. The problem occurs daily. The problem has been unchanged. The quality of the pain is cramping and a sensation of fullness. The abdominal pain radiates to the suprapubic region, RLQ and LLQ.  Knee Pain  There was no injury mechanism. The pain is present in the right knee and left knee. The quality of the pain is described as aching. The pain has been intermittent since onset. Pertinent negatives include no inability to bear weight or loss of motion. Treatments tried: topical rubs, tylenol. The treatment provided moderate relief.    CT yesterday: IMPRESSION: 1. Segmental edematous mural thickening and pericolonic stranding involving the distal ascending, transverse, and proximal descending colon most notably at the level of the hepatic flexure as well some clustered, hyperemic appearing small bowel loops in the low mid abdomen is suggestive of an infectious or inflammatory enterocolitis. No pneumatosis, extraluminal gas, organized fluid or abscess is evident. 2. Small volume free fluid in the right pericolic gutter and pelvis is favored to be reactive to the colonic process. 3. Multiple large hepatic cysts measuring up to 4.5 cm in the right lobe and 4 cm in the left lobe liver. No worrisome  imaging features. 4. Fibroid uterus  Lab Results  Component Value Date   CREATININE 0.88 11/30/2019   BUN 19 11/30/2019   NA 140 11/30/2019   K 3.8 11/30/2019   CL 105 11/30/2019   CO2 27 11/30/2019   Lab Results  Component Value Date   CHOL 190 06/18/2019   HDL 52 06/18/2019   LDLCALC 120 06/18/2019   TRIG 106 06/17/2018   CHOLHDL 4.1 10/27/2016   Lab Results  Component Value Date   TSH 1.580 10/27/2016   No results found for: HGBA1C Lab Results  Component Value Date   WBC 9.3 11/30/2019   HGB 13.7 11/30/2019   HCT 44.1 11/30/2019   MCV 86.8 11/30/2019   PLT 191 11/30/2019   Lab Results  Component Value Date   ALT 15 06/18/2019   AST 18 06/18/2019   ALKPHOS 84 03/27/2017   BILITOT 0.9 03/27/2017     Review of Systems  Gastrointestinal: Positive for abdominal pain.    Patient Active Problem List   Diagnosis Date Noted  . Chronic pain of left knee 04/06/2017  . Hyperlipidemia 04/26/2016  . Vitamin D deficiency 04/26/2016  . BPPV (benign paroxysmal positional vertigo) 04/26/2016  . Varicose veins 04/26/2016  . GERD (gastroesophageal reflux disease) 01/04/2015  . Osteoporosis 01/04/2015  . Diffuse arthralgia 01/04/2015  . Lateral epicondylitis of right elbow 01/04/2015    Allergies  Allergen Reactions  . Amoxicillin Hives  . Hydrocodone Other (See Comments)    headache  . Oxycodone Hcl  Rash    Past Surgical History:  Procedure Laterality Date  . COLONOSCOPY WITH PROPOFOL N/A 11/13/2016   Procedure: COLONOSCOPY WITH PROPOFOL;  Surgeon: Lollie Sails, MD;  Location: [facility] ENDOSCOPY;  Service: Endoscopy;  Laterality: N/A;    Social History   Tobacco Use  . Smoking status: Former Smoker    Types: Cigarettes    Quit date: 10/14/1975    Years since quitting: 44.1  . Smokeless tobacco: Never Used  Substance Use Topics  . Alcohol use: No  . Drug use: No     Medication list has been reviewed and updated.  Current Meds  Medication Sig  .  Ascorbic Acid (VITAMIN C) 500 MG CAPS Take 1,000 mg by mouth.  Marland Kitchen aspirin EC 81 MG tablet Take 81 mg by mouth daily as needed (learned that it is good to take everyday).  . Cholecalciferol (VITAMIN D) 2000 units tablet Take 2,000 Units by mouth daily.  . fluticasone (FLONASE) 50 MCG/ACT nasal spray Place 2 sprays into both nostrils daily.  Marland Kitchen ibandronate (BONIVA) 150 MG tablet Take 1 tablet by mouth every 30 (thirty) days.  . meclizine (ANTIVERT) 25 MG tablet Take 1 tablet (25 mg total) by mouth 3 (three) times daily as needed for dizziness.  . metroNIDAZOLE (FLAGYL) 500 MG tablet Take 1 tablet (500 mg total) by mouth 3 (three) times daily.  . naproxen (NAPROSYN) 500 MG tablet Take 1 tablet (500 mg total) by mouth 2 (two) times daily with a meal.  . simvastatin (ZOCOR) 10 MG tablet Take 10 mg by mouth at bedtime.  . sulfamethoxazole-trimethoprim (BACTRIM DS) 800-160 MG tablet Take 1 tablet by mouth 2 (two) times daily.  . traMADol (ULTRAM) 50 MG tablet Take 1 tablet (50 mg total) by mouth every 6 (six) hours as needed.  . valACYclovir (VALTREX) 1000 MG tablet Take 1 tablet (1,000 mg total) by mouth 3 (three) times daily.    PHQ 2/9 Scores 12/02/2019 06/20/2019 01/29/2019 05/22/2018  PHQ - 2 Score 0 0 0 0    BP Readings from Last 3 Encounters:  12/02/19 106/72  12/01/19 123/68  06/20/19 122/72    Physical Exam Vitals and nursing note reviewed.  Constitutional:      General: She is not in acute distress.    Appearance: Normal appearance. She is well-developed.  HENT:     Head: Normocephalic and atraumatic.  Cardiovascular:     Rate and Rhythm: Normal rate and regular rhythm.     Pulses: Normal pulses.     Heart sounds: No murmur.  Pulmonary:     Effort: Pulmonary effort is normal. No respiratory distress.     Breath sounds: No wheezing or rhonchi.  Abdominal:     General: Abdomen is flat. Bowel sounds are decreased.     Palpations: Abdomen is soft.     Tenderness: There is abdominal  tenderness in the suprapubic area, left upper quadrant and left lower quadrant. There is no right CVA tenderness, left CVA tenderness or guarding.  Musculoskeletal:        General: Swelling (varicose veins worse on left) present.     Cervical back: Normal range of motion.     Right knee: No crepitus. Decreased range of motion. Tenderness present.     Left knee: No crepitus. Decreased range of motion. Tenderness present over the lateral joint line.     Right lower leg: No edema.     Left lower leg: No edema.  Lymphadenopathy:     Cervical:  No cervical adenopathy.  Skin:    General: Skin is warm and dry.     Findings: No rash.  Neurological:     Mental Status: She is alert and oriented to person, place, and time.  Psychiatric:        Behavior: Behavior normal.        Thought Content: Thought content normal.     Wt Readings from Last 3 Encounters:  12/02/19 208 lb (94.3 kg)  11/30/19 190 lb (86.2 kg)  06/20/19 197 lb (89.4 kg)    BP 106/72   Pulse 68   Temp (!) 97.5 F (36.4 C) (Temporal)   Ht 5\' 11"  (1.803 m)   Wt 208 lb (94.3 kg)   SpO2 96%   BMI 29.01 kg/m   Assessment and Plan: 1. Colitis Abdominal pain is much improved today Continue and complete Flagyl course [facility] cant see her until the end of April so will refer to [facility] - Ambulatory referral to Gastroenterology  2. Acute cystitis without hematuria UA still positive Take full course of Bactrim - take with food to reduce nausea  3. Primary osteoarthritis of both knees Recommend continue Tylenol and topical rubs If sx worsen, will consider Xray or Ortho referral  4. Weight gain, abnormal No evidence of fluid retention - TSH + free T4   Partially dictated using Editor, commissioning. Any errors are unintentional.  Halina Maidens, MD [facility]  12/02/2019

## 2019-12-11 ENCOUNTER — Ambulatory Visit (INDEPENDENT_AMBULATORY_CARE_PROVIDER_SITE_OTHER): Payer: Medicare HMO | Admitting: Gastroenterology

## 2019-12-11 ENCOUNTER — Other Ambulatory Visit: Payer: Self-pay

## 2019-12-11 ENCOUNTER — Encounter: Payer: Self-pay | Admitting: Gastroenterology

## 2019-12-11 VITALS — BP 118/77 | HR 74 | Temp 97.6°F | Ht 71.0 in | Wt 202.0 lb

## 2019-12-11 DIAGNOSIS — K529 Noninfective gastroenteritis and colitis, unspecified: Secondary | ICD-10-CM | POA: Diagnosis not present

## 2019-12-11 DIAGNOSIS — R1030 Lower abdominal pain, unspecified: Secondary | ICD-10-CM | POA: Diagnosis not present

## 2019-12-11 NOTE — Progress Notes (Signed)
Cephas Darby, MD [address]  [place]  [place], [place] [address]  Main: [phone]  Fax: [phone]    Gastroenterology Consultation  Referring Provider:     Glean Hess, MD Primary Care Physician:  Glean Hess, MD Primary Gastroenterologist:  Dr. Cephas Darby Reason for Consultation: Lower abdominal pain        HPI:   Christine Ray is a 64 y.o. female referred by Dr. Army Melia, Jesse Sans, MD  for consultation & management of lower abdominal pain.  Patient went to ER on 12/01/2019 secondary to lower abdominal pain.  She underwent CT abdomen pelvis with contrast which revealed thickening of the colon as well as hyperemic appearing small bowel loops in the lower midabdomen suggestive of infectious or inflammatory enterocolitis.  Patient also had abnormal UA concerning for UTI.  Her labs revealed normal CBC, BMP.  She was discharged home on Bactrim and metronidazole.  She has 1 more day of antibiotics to finish.  She reports that her symptoms have completely resolved.  She is now having 1-2 soft bowel movements daily.  She denies abdominal pain, nausea or vomiting.  She denies having diarrhea with the onset of abdominal pain.  She denies eating out.  She reports that she has been going through lot of family stress within last few months.  She denies any weight loss.  She does not smoke or drink alcohol  NSAIDs: None  Antiplts/Anticoagulants/Anti thrombotics: None Patient denies family history of GI malignancy, inflammatory bowel disease  GI Procedures:  Colonoscopy 11/13/2016 - One 3 mm polyp in the rectum, removed with a cold biopsy forceps. Resected and retrieved. - One 7 mm polyp in the descending colon, removed with a hot snare. Resected and retrieved. - One 3 mm polyp at the splenic flexure, removed with a cold biopsy forceps. Resected and retrieved. - One 2 mm polyp in the sigmoid colon, removed with a cold biopsy forceps. Resected and retrieved. -  Non-bleeding internal hemorrhoids.  DIAGNOSIS:  A. RECTUM POLYP; COLD BIOPSY:  - HYPERPLASTIC POLYP.  - NEGATIVE FOR DYSPLASIA AND MALIGNANCY.   B. COLON POLYP, DESCENDING; HOT SNARE:  - TUBULAR ADENOMA.  - NEGATIVE FOR HIGH-GRADE DYSPLASIA AND MALIGNANCY.   C. COLON POLYP, SPLENIC FLEXURE; COLD BIOPSY:  - COLONIC MUCOSA NEGATIVE FOR DYSPLASIA AND MALIGNANCY.  - MULTIPLE DEEPER LEVELS WERE EXAMINED.   D. COLON POLYP, SIGMOID; COLD BIOPSY:  - COLONIC MUCOSA NEGATIVE FOR DYSPLASIA AND MALIGNANCY.  - MULTIPLE DEEPER LEVELS WERE EXAMINED.   Past Medical History:  Diagnosis Date  . Hyperlipidemia   . Lateral epicondylitis of right elbow 01/04/2015    Past Surgical History:  Procedure Laterality Date  . COLONOSCOPY WITH PROPOFOL N/A 11/13/2016   Procedure: COLONOSCOPY WITH PROPOFOL;  Surgeon: Lollie Sails, MD;  Location: [facility] ENDOSCOPY;  Service: Endoscopy;  Laterality: N/A;    Current Outpatient Medications:  .  Ascorbic Acid (VITAMIN C) 500 MG CAPS, Take 1,000 mg by mouth., Disp: , Rfl:  .  aspirin EC 81 MG tablet, Take 81 mg by mouth daily as needed (learned that it is good to take everyday)., Disp: , Rfl:  .  Cholecalciferol (VITAMIN D) 2000 units tablet, Take 2,000 Units by mouth daily., Disp: , Rfl:  .  fluticasone (FLONASE) 50 MCG/ACT nasal spray, Place 2 sprays into both nostrils daily., Disp: 16 g, Rfl: 12 .  ibandronate (BONIVA) 150 MG tablet, Take 1 tablet by mouth every 30 (thirty) days., Disp: , Rfl:  .  meclizine (ANTIVERT) 25 MG tablet, Take 1 tablet (25 mg total) by mouth 3 (three) times daily as needed for dizziness., Disp: 30 tablet, Rfl: 0 .  metroNIDAZOLE (FLAGYL) 500 MG tablet, Take 1 tablet (500 mg total) by mouth 3 (three) times daily., Disp: 30 tablet, Rfl: 0 .  naproxen (NAPROSYN) 500 MG tablet, Take 1 tablet (500 mg total) by mouth 2 (two) times daily with a meal., Disp: 180 tablet, Rfl: 0 .  simvastatin (ZOCOR) 10 MG tablet, Take 10 mg by mouth at  bedtime., Disp: , Rfl: 11 .  traMADol (ULTRAM) 50 MG tablet, Take 1 tablet (50 mg total) by mouth every 6 (six) hours as needed., Disp: 15 tablet, Rfl: 0 .  valACYclovir (VALTREX) 1000 MG tablet, Take 1 tablet (1,000 mg total) by mouth 3 (three) times daily., Disp: 30 tablet, Rfl: 0   Family History  Problem Relation Age of Onset  . Breast cancer Mother 58  . Prostate cancer Father   . Throat cancer Brother      Social History   Tobacco Use  . Smoking status: Former Smoker    Types: Cigarettes    Quit date: 10/14/1975    Years since quitting: 44.1  . Smokeless tobacco: Never Used  Substance Use Topics  . Alcohol use: No  . Drug use: No    Allergies as of 12/11/2019 - Review Complete 12/11/2019  Allergen Reaction Noted  . Amoxicillin Hives 06/11/2018  . Hydrocodone Other (See Comments) 10/14/2015  . Oxycodone hcl Rash 01/04/2015    Review of Systems:    All systems reviewed and negative except where noted in HPI.   Physical Exam:  BP 118/77 (BP Location: Left Arm, Patient Position: Sitting, Cuff Size: Normal)   Pulse 74   Temp 97.6 F (36.4 C) (Oral)   Ht 5\' 11"  (1.803 m)   Wt 202 lb (91.6 kg)   BMI 28.17 kg/m  No LMP recorded. Patient is postmenopausal.  General:   Alert,  Well-developed, well-nourished, pleasant and cooperative in NAD Head:  Normocephalic and atraumatic. Eyes:  Sclera clear, no icterus.   Conjunctiva pink. Ears:  Normal auditory acuity. Nose:  No deformity, discharge, or lesions. Mouth:  No deformity or lesions,oropharynx pink & moist. Neck:  Supple; no masses or thyromegaly. Lungs:  Respirations even and unlabored.  Clear throughout to auscultation.   No wheezes, crackles, or rhonchi. No acute distress. Heart:  Regular rate and rhythm; no murmurs, clicks, rubs, or gallops. Abdomen:  Normal bowel sounds. Soft, non-tender and non-distended without masses, hepatosplenomegaly or hernias noted.  No guarding or rebound tenderness.   Rectal: Not  performed Msk:  Symmetrical without gross deformities. Good, equal movement & strength bilaterally. Pulses:  Normal pulses noted. Extremities:  No clubbing or edema.  No cyanosis. Neurologic:  Alert and oriented x3;  grossly normal neurologically. Skin:  Intact without significant lesions or rashes. No jaundice. Psych:  Alert and cooperative. Normal mood and affect.  Imaging Studies: Reviewed  Assessment and Plan:   Christine Ray is a 64 y.o. female with no significant past medical history is seen in consultation for acute episode of lower abdominal pain and CT revealed enterocolitis.  Treated for probable enterocolitis and UTI.  Patient is currently asymptomatic.  Patient had a colonoscopy in 2018 which revealed normal-appearing mucosa of the colon.  She is otherwise asymptomatic other than the recent episode.  Reassured the patient that it could be viral infection.  If her GI symptoms recur, will perform stool  studies and discuss about endoscopic evaluation and patient is agreeable with the plan  Tubular adenoma of the colon: Recommend surveillance colonoscopy in 10/2021  Follow up as needed   Cephas Darby, MD

## 2020-01-22 ENCOUNTER — Other Ambulatory Visit: Payer: Self-pay | Admitting: Internal Medicine

## 2020-01-22 DIAGNOSIS — R42 Dizziness and giddiness: Secondary | ICD-10-CM

## 2020-01-22 NOTE — Telephone Encounter (Signed)
Apologies- sent to wrong office 

## 2020-01-22 NOTE — Telephone Encounter (Signed)
I think this was sent to the wrong office 

## 2020-01-22 NOTE — Telephone Encounter (Signed)
Requested medication (s) are due for refill today -yes  Requested medication (s) are on the active medication list -yes  Future visit scheduled -no  Last refill: 10/21/19  Notes to clinic: Request for non delegated Rx  Requested Prescriptions  Pending Prescriptions Disp Refills   meclizine (ANTIVERT) 25 MG tablet [Pharmacy Med Name: MECLIZINE HCL 25 MG Tablet] 30 tablet 0    Sig: TAKE 1 TABLET THREE TIMES DAILY AS NEEDED  FOR  DIZZINESS      Not Delegated - Gastroenterology: Antiemetics Failed - 01/22/2020 10:59 AM      Failed - This refill cannot be delegated      Passed - Valid encounter within last 6 months    Recent Outpatient Visits           1 month ago [facility] Glean Hess, MD   7 months ago Patellar tendonitis of both knees   [facility] Glean Hess, MD   10 months ago Sinus congestion   [facility] Glean Hess, MD   11 months ago Ganglion cyst of finger of left hand   [facility] Glean Hess, MD   1 year ago Acute non-recurrent maxillary sinusitis   [facility] Glean Hess, MD                  Requested Prescriptions  Pending Prescriptions Disp Refills   meclizine (ANTIVERT) 25 MG tablet [Pharmacy Med Name: MECLIZINE HCL 25 MG Tablet] 30 tablet 0    Sig: TAKE 1 TABLET THREE TIMES DAILY AS NEEDED  FOR  DIZZINESS      Not Delegated - Gastroenterology: Antiemetics Failed - 01/22/2020 10:59 AM      Failed - This refill cannot be delegated      Passed - Valid encounter within last 6 months    Recent Outpatient Visits           1 month ago [facility] Glean Hess, MD   7 months ago Patellar tendonitis of both knees   [facility] Glean Hess, MD   10 months ago Sinus congestion   [facility] Glean Hess, MD   11 months ago Ganglion cyst of finger of left hand   [facility] Glean Hess, MD   1  year ago Acute non-recurrent maxillary sinusitis   [facility] Glean Hess, MD

## 2020-01-29 ENCOUNTER — Other Ambulatory Visit: Payer: Self-pay

## 2020-01-29 ENCOUNTER — Ambulatory Visit (INDEPENDENT_AMBULATORY_CARE_PROVIDER_SITE_OTHER): Payer: Medicare HMO | Admitting: Internal Medicine

## 2020-01-29 ENCOUNTER — Encounter: Payer: Self-pay | Admitting: Internal Medicine

## 2020-01-29 VITALS — BP 122/78 | HR 68 | Temp 97.5°F | Ht 71.0 in | Wt 206.0 lb

## 2020-01-29 DIAGNOSIS — G8929 Other chronic pain: Secondary | ICD-10-CM | POA: Diagnosis not present

## 2020-01-29 DIAGNOSIS — L03115 Cellulitis of right lower limb: Secondary | ICD-10-CM | POA: Diagnosis not present

## 2020-01-29 DIAGNOSIS — I83813 Varicose veins of bilateral lower extremities with pain: Secondary | ICD-10-CM | POA: Diagnosis not present

## 2020-01-29 DIAGNOSIS — M62830 Muscle spasm of back: Secondary | ICD-10-CM | POA: Diagnosis not present

## 2020-01-29 DIAGNOSIS — M25562 Pain in left knee: Secondary | ICD-10-CM | POA: Diagnosis not present

## 2020-01-29 MED ORDER — BACLOFEN 10 MG PO TABS: 10.0000 mg | ORAL_TABLET | Freq: Every day | ORAL | 0 refills | Status: DC

## 2020-01-29 NOTE — Patient Instructions (Signed)
Use cortisone cream on tick bite site as needed for itching

## 2020-01-29 NOTE — Progress Notes (Signed)
Date:  01/29/2020   Name:  Christine Ray   DOB:  Oct 28, 1955   MRN:  [id number]   Chief Complaint: Tick Removal (Right thigh- Noticed it Monday. Brought the tick in today in a bag. This one hurts and there is a rash around it and swollen and itchy. There is hard spot under the skin. ), Back Pain (Left lower back pain. When she sits straight up it feels like her lower back "catches." ), and Leg Pain (Behind the knee pain- still present. Said she was seen for this before. Wants to see a specialist for this. )   Tick bite - pulled a tick off her left thigh 3 days ago.  She does not think it was attached for long.  Now the area is raised, itching and slightly red.  No fever or chills, no headache.  Back Pain This is a recurrent problem. The problem is unchanged. The pain is present in the lumbar spine. The pain does not radiate. The symptoms are aggravated by twisting and stress. Associated symptoms include leg pain. Pertinent negatives include no bladder incontinence, bowel incontinence, chest pain, fever, headaches, numbness, paresthesias, perianal numbness or weakness.  Leg Pain  There was no injury mechanism. The pain is present in the left leg and right leg. The quality of the pain is described as aching and burning. The pain is moderate. The pain has been fluctuating since onset. Pertinent negatives include no numbness. Treatments tried: compression stockings, elevation.  Knee Pain  There was no injury mechanism. The pain is present in the left knee. The quality of the pain is described as aching. The pain is mild. The pain has been fluctuating since onset. Pertinent negatives include no numbness. The symptoms are aggravated by palpation and weight bearing. She has tried NSAIDs for the symptoms.    Lab Results  Component Value Date   CREATININE 0.88 11/30/2019   BUN 19 11/30/2019   NA 140 11/30/2019   K 3.8 11/30/2019   CL 105 11/30/2019   CO2 27 11/30/2019   Lab Results  Component  Value Date   CHOL 190 06/18/2019   HDL 52 06/18/2019   LDLCALC 120 06/18/2019   TRIG 106 06/17/2018   CHOLHDL 4.1 10/27/2016   Lab Results  Component Value Date   TSH 0.566 12/02/2019   No results found for: HGBA1C Lab Results  Component Value Date   WBC 9.3 11/30/2019   HGB 13.7 11/30/2019   HCT 44.1 11/30/2019   MCV 86.8 11/30/2019   PLT 191 11/30/2019   Lab Results  Component Value Date   ALT 15 06/18/2019   AST 18 06/18/2019   ALKPHOS 84 03/27/2017   BILITOT 0.9 03/27/2017     Review of Systems  Constitutional: Negative for chills, fatigue and fever.  Respiratory: Negative for chest tightness and shortness of breath.   Cardiovascular: Positive for leg swelling (painful varicose veins). Negative for chest pain and palpitations.  Gastrointestinal: Negative for bowel incontinence.  Genitourinary: Negative for bladder incontinence.  Musculoskeletal: Positive for back pain.  Skin: Positive for color change.  Neurological: Negative for dizziness, weakness, numbness, headaches and paresthesias.  Psychiatric/Behavioral: Negative for dysphoric mood and sleep disturbance. The patient is not nervous/anxious.     Patient Active Problem List   Diagnosis Date Noted  . Chronic pain of left knee 04/06/2017  . Hyperlipidemia 04/26/2016  . Vitamin D deficiency 04/26/2016  . BPPV (benign paroxysmal positional vertigo) 04/26/2016  . Varicose veins 04/26/2016  .  GERD (gastroesophageal reflux disease) 01/04/2015  . Osteoporosis 01/04/2015    Allergies  Allergen Reactions  . Amoxicillin Hives  . Hydrocodone Other (See Comments)    headache  . Oxycodone Hcl Rash    Past Surgical History:  Procedure Laterality Date  . COLONOSCOPY WITH PROPOFOL N/A 11/13/2016   Procedure: COLONOSCOPY WITH PROPOFOL;  Surgeon: Lollie Sails, MD;  Location: [facility] ENDOSCOPY;  Service: Endoscopy;  Laterality: N/A;    Social History   Tobacco Use  . Smoking status: Former Smoker    Types:  Cigarettes    Quit date: 10/14/1975    Years since quitting: 44.3  . Smokeless tobacco: Never Used  Substance Use Topics  . Alcohol use: No  . Drug use: No     Medication list has been reviewed and updated.  Current Meds  Medication Sig  . Ascorbic Acid (VITAMIN C) 500 MG CAPS Take 1,000 mg by mouth.  Marland Kitchen aspirin EC 81 MG tablet Take 81 mg by mouth daily as needed (learned that it is good to take everyday).  . Cholecalciferol (VITAMIN D) 2000 units tablet Take 2,000 Units by mouth daily.  . fluticasone (FLONASE) 50 MCG/ACT nasal spray Place 2 sprays into both nostrils daily.  Marland Kitchen ibandronate (BONIVA) 150 MG tablet Take 1 tablet by mouth every 30 (thirty) days.  . meclizine (ANTIVERT) 25 MG tablet TAKE 1 TABLET THREE TIMES DAILY AS NEEDED  FOR  DIZZINESS  . metroNIDAZOLE (FLAGYL) 500 MG tablet Take 1 tablet (500 mg total) by mouth 3 (three) times daily.  . naproxen (NAPROSYN) 500 MG tablet Take 1 tablet (500 mg total) by mouth 2 (two) times daily with a meal.  . simvastatin (ZOCOR) 10 MG tablet Take 10 mg by mouth at bedtime.  . traMADol (ULTRAM) 50 MG tablet Take 1 tablet (50 mg total) by mouth every 6 (six) hours as needed.  . valACYclovir (VALTREX) 1000 MG tablet Take 1 tablet (1,000 mg total) by mouth 3 (three) times daily.    PHQ 2/9 Scores 01/29/2020 12/02/2019 06/20/2019 01/29/2019  PHQ - 2 Score 0 0 0 0  PHQ- 9 Score 0 - - -    BP Readings from Last 3 Encounters:  01/29/20 122/78  12/11/19 118/77  12/02/19 106/72    Physical Exam Vitals and nursing note reviewed.  Constitutional:      General: She is not in acute distress.    Appearance: Normal appearance. She is well-developed.  HENT:     Head: Normocephalic and atraumatic.  Cardiovascular:     Rate and Rhythm: Normal rate and regular rhythm.  Pulmonary:     Effort: Pulmonary effort is normal. No respiratory distress.     Breath sounds: No wheezing or rhonchi.  Musculoskeletal:     Cervical back: Normal range of  motion.       Back:     Right knee: Normal.     Left knee: Crepitus present. No effusion or ecchymosis. Normal range of motion. Tenderness (posteriorly without fullness) present.     Right lower leg: Edema present.     Left lower leg: Edema present.  Skin:    General: Skin is warm and dry.     Findings: Lesion present.       Neurological:     Mental Status: She is alert and oriented to person, place, and time.  Psychiatric:        Behavior: Behavior normal.        Thought Content: Thought content normal.  Wt Readings from Last 3 Encounters:  01/29/20 206 lb (93.4 kg)  12/11/19 202 lb (91.6 kg)  12/02/19 208 lb (94.3 kg)    BP 122/78   Pulse 68   Temp (!) 97.5 F (36.4 C) (Temporal)   Ht 5\' 11"  (1.803 m)   Wt 206 lb (93.4 kg)   SpO2 97%   BMI 28.73 kg/m   Assessment and Plan: 1. Chronic pain of left knee Previous xrays showed mild degenerative changes Pt has not had satisfactory benefit from nsaids - Ambulatory referral to Orthopedic Surgery  2. Cellulitis of right lower extremity Due to tick bite Recommend cortisone cream as needed  3. Muscle spasm of back Use heat and take baclofen at bedtime - baclofen (LIORESAL) 10 MG tablet; Take 1 tablet (10 mg total) by mouth at bedtime.  Dispense: 30 each; Refill: 0  4. Varicose veins of both lower extremities with pain - Ambulatory referral to Vascular Surgery   Partially dictated using Brutus. Any errors are unintentional.  Halina Maidens, MD [facility]  01/29/2020

## 2020-02-02 ENCOUNTER — Other Ambulatory Visit (INDEPENDENT_AMBULATORY_CARE_PROVIDER_SITE_OTHER): Payer: Self-pay | Admitting: Nurse Practitioner

## 2020-02-02 DIAGNOSIS — I83813 Varicose veins of bilateral lower extremities with pain: Secondary | ICD-10-CM

## 2020-02-04 ENCOUNTER — Ambulatory Visit (INDEPENDENT_AMBULATORY_CARE_PROVIDER_SITE_OTHER): Payer: Medicare HMO | Admitting: Nurse Practitioner

## 2020-02-04 ENCOUNTER — Ambulatory Visit (INDEPENDENT_AMBULATORY_CARE_PROVIDER_SITE_OTHER): Payer: Medicare HMO

## 2020-02-04 ENCOUNTER — Encounter (INDEPENDENT_AMBULATORY_CARE_PROVIDER_SITE_OTHER): Payer: Self-pay | Admitting: Nurse Practitioner

## 2020-02-04 ENCOUNTER — Other Ambulatory Visit: Payer: Self-pay

## 2020-02-04 VITALS — BP 125/85 | HR 58 | Ht 70.0 in | Wt 203.0 lb

## 2020-02-04 DIAGNOSIS — I83813 Varicose veins of bilateral lower extremities with pain: Secondary | ICD-10-CM

## 2020-02-04 NOTE — Progress Notes (Signed)
Subjective:    Patient ID: Christine Ray, female    DOB: 07-Oct-1955, 64 y.o.   MRN: [id number] Chief Complaint  Patient presents with  . New Patient (Initial Visit)    Varicose veins BLE    The patient presents today for evaluation by Dr. Army Melia in regards to her varicose veins.  The patient reports having pain in her lower extremities as well as some swelling mainly near the left ankle area.  She states that the pain occurs mainly in and around her medial knee area as well as the posterior knee.  She describes it as a throbbing aching sensation.  There is also a feeling of pressure behind the knee.  Patient has had varicose veins for a number of years and was concerned this could be causing the pain and discomfort.  The patient utilizes medical grade 1 compression stockings and elevation on a daily basis however this does not relieve the pain and discomfort.  The patient has worn compression for a number of years.  Prior to her retiring she wore support hose daily for well over 20 years.  The patient states that the pain is worse when she is in a sitting position especially if she is driving for extended periods.  The patient does endorse having some knee pain but not significant issues.  Today the patient underwent noninvasive studies.  There is no evidence of DVT seen bilaterally.  No evidence of superficial venous thrombosis seen bilaterally.  No evidence of deep venous insufficiency bilaterally.  No evidence of superficial venous reflux bilaterally.   Review of Systems  Cardiovascular: Positive for leg swelling.  Musculoskeletal:       Aching, throbbing in the legs  All other systems reviewed and are negative.      Objective:   Physical Exam Vitals reviewed.  Constitutional:      Appearance: Normal appearance.  HENT:     Head: Normocephalic.  Cardiovascular:     Rate and Rhythm: Normal rate and regular rhythm.     Pulses: Normal pulses.     Heart sounds: Normal heart  sounds.     Comments: Scattered varicosities bilaterally however more prominent varicosities on the left lower extremity.  Large varicosity near the ankle area approximately 3 to 4 mm Pulmonary:     Effort: Pulmonary effort is normal.     Breath sounds: Normal breath sounds.  Skin:    General: Skin is warm and dry.  Neurological:     Mental Status: She is alert and oriented to person, place, and time.  Psychiatric:        Mood and Affect: Mood normal.        Behavior: Behavior normal.        Thought Content: Thought content normal.        Judgment: Judgment normal.     BP 125/85   Pulse (!) 58   Ht 5\' 10"  (1.778 m)   Wt 203 lb (92.1 kg)   BMI 29.13 kg/m   Past Medical History:  Diagnosis Date  . Hyperlipidemia   . Lateral epicondylitis of right elbow 01/04/2015    Social History   Socioeconomic History  . Marital status: Married    Spouse name: Not on file  . Number of children: Not on file  . Years of education: Not on file  . Highest education level: Not on file  Occupational History  . Not on file  Tobacco Use  . Smoking status: Former Smoker  Types: Cigarettes    Quit date: 10/14/1975    Years since quitting: 44.3  . Smokeless tobacco: Never Used  Substance and Sexual Activity  . Alcohol use: No  . Drug use: No  . Sexual activity: Not on file  Other Topics Concern  . Not on file  Social History Narrative  . Not on file   Social Determinants of Health   Financial Resource Strain:   . Difficulty of Paying Living Expenses:   Food Insecurity:   . Worried About Charity fundraiser in the Last Year:   . Arboriculturist in the Last Year:   Transportation Needs:   . Film/video editor (Medical):   Marland Kitchen Lack of Transportation (Non-Medical):   Physical Activity:   . Days of Exercise per Week:   . Minutes of Exercise per Session:   Stress:   . Feeling of Stress :   Social Connections:   . Frequency of Communication with Friends and Family:   .  Frequency of Social Gatherings with Friends and Family:   . Attends Religious Services:   . Active Member of Clubs or Organizations:   . Attends Archivist Meetings:   Marland Kitchen Marital Status:   Intimate Partner Violence:   . Fear of Current or Ex-Partner:   . Emotionally Abused:   Marland Kitchen Physically Abused:   . Sexually Abused:     Past Surgical History:  Procedure Laterality Date  . COLONOSCOPY WITH PROPOFOL N/A 11/13/2016   Procedure: COLONOSCOPY WITH PROPOFOL;  Surgeon: Lollie Sails, MD;  Location[facility] ENDOSCOPY;  Service: Endoscopy;  Laterality: N/A;    Family History  Problem Relation Age of Onset  . Breast cancer Mother 33  . Prostate cancer Father   . Throat cancer Brother     Allergies  Allergen Reactions  . Amoxicillin Hives  . Hydrocodone Other (See Comments)    headache  . Oxycodone Hcl Rash       Assessment & Plan:   1. Varicose veins of both lower extremities with pain Recommend:  The patient is complaining of varicose veins.    After discussion with the patient regarding the natural pathophysiology of varicose veins and venous reflux.  The patient's description of pain and symptoms is not fully consistent with pain typically caused by varicose veins however it is not impossible.  However, the patient's noninvasive studies show that she has no evidence of venous reflux, therefore any treatment would be considered cosmetic at this time.  The patient is advised to continue with conservative therapy such as utilizing medical grade 1 compression, elevation as well as exercise.  Over-the-counter NSAIDs can be utilized for pain relief.  Patient also advised to follow-up with orthopedics as well as primary care for further work-up of pain and discomfort.  The Patient will follow up PRN if the symptoms worsen.   Current Outpatient Medications on File Prior to Visit  Medication Sig Dispense Refill  . Ascorbic Acid (VITAMIN C) 500 MG CAPS Take 1,000 mg by  mouth.    Marland Kitchen aspirin EC 81 MG tablet Take 81 mg by mouth daily as needed (learned that it is good to take everyday).    . baclofen (LIORESAL) 10 MG tablet Take 1 tablet (10 mg total) by mouth at bedtime. 30 each 0  . Cholecalciferol (VITAMIN D) 2000 units tablet Take 2,000 Units by mouth daily.    . fluticasone (FLONASE) 50 MCG/ACT nasal spray Place 2 sprays into both nostrils daily. [place]  g 12  . ibandronate (BONIVA) 150 MG tablet Take 1 tablet by mouth every 30 (thirty) days.    . meclizine (ANTIVERT) 25 MG tablet TAKE 1 TABLET THREE TIMES DAILY AS NEEDED  FOR  DIZZINESS 30 tablet 0  . naproxen (NAPROSYN) 500 MG tablet Take 1 tablet (500 mg total) by mouth 2 (two) times daily with a meal. 180 tablet 0  . simvastatin (ZOCOR) 10 MG tablet Take 10 mg by mouth at bedtime.  11  . traMADol (ULTRAM) 50 MG tablet Take 1 tablet (50 mg total) by mouth every 6 (six) hours as needed. 15 tablet 0  . valACYclovir (VALTREX) 1000 MG tablet Take 1 tablet (1,000 mg total) by mouth 3 (three) times daily. 30 tablet 0  . metroNIDAZOLE (FLAGYL) 500 MG tablet Take 1 tablet (500 mg total) by mouth 3 (three) times daily. (Patient not taking: Reported on 02/04/2020) 30 tablet 0   No current facility-administered medications on file prior to visit.    There are no Patient Instructions on file for this visit. No follow-ups on file.   Kris Hartmann, NP

## 2020-03-10 ENCOUNTER — Ambulatory Visit (INDEPENDENT_AMBULATORY_CARE_PROVIDER_SITE_OTHER): Payer: Medicare HMO

## 2020-03-10 DIAGNOSIS — Z0000 Encounter for general adult medical examination without abnormal findings: Secondary | ICD-10-CM | POA: Diagnosis not present

## 2020-03-10 NOTE — Patient Instructions (Signed)
Ms. Christine Ray , Thank you for taking time to come for your Medicare Wellness Visit. I appreciate your ongoing commitment to your health goals. Please review the following plan we discussed and let me know if I can assist you in the future.   Screening recommendations/referrals: Colonoscopy: done 11/13/16. Repeat in 2028. Mammogram: done 07/09/19. Due 07/08/20. Bone Density: done 06/17/18. Due 06/17/20. Recommended yearly ophthalmology/optometry visit for glaucoma screening and checkup Recommended yearly dental visit for hygiene and checkup  Vaccinations: Influenza vaccine: done 06/13/19 Pneumococcal vaccine: done 01/29/19. Due for PPSV23 Tdap vaccine: done 06/11/18 Shingles vaccine: done 05/15/18 & 07/19/18   Covid-19:done 10/05/19 7 11/02/19   Advanced directives: Please bring a copy of your health care power of attorney and living will to the office at your convenience.  Conditions/risks identified: Keep up the great work!  Next appointment: Follow up in one year for your annual wellness visit    Preventive Care 65 Years and Older, Female Preventive care refers to lifestyle choices and visits with your health care provider that can promote health and wellness. What does preventive care include?  A yearly physical exam. This is also called an annual well check.  Dental exams once or twice a year.  Routine eye exams. Ask your health care provider how often you should have your eyes checked.  Personal lifestyle choices, including:  Daily care of your teeth and gums.  Regular physical activity.  Eating a healthy diet.  Avoiding tobacco and drug use.  Limiting alcohol use.  Practicing safe sex.  Taking low-dose aspirin every day.  Taking vitamin and mineral supplements as recommended by your health care provider. What happens during an annual well check? The services and screenings done by your health care provider during your annual well check will depend on your age, overall  health, lifestyle risk factors, and family history of disease. Counseling  Your health care provider may ask you questions about your:  Alcohol use.  Tobacco use.  Drug use.  Emotional well-being.  Home and relationship well-being.  Sexual activity.  Eating habits.  History of falls.  Memory and ability to understand (cognition).  Work and work Statistician.  Reproductive health. Screening  You may have the following tests or measurements:  Height, weight, and BMI.  Blood pressure.  Lipid and cholesterol levels. These may be checked every 5 years, or more frequently if you are over 3 years old.  Skin check.  Lung cancer screening. You may have this screening every year starting at age 65 if you have a 30-pack-year history of smoking and currently smoke or have quit within the past 15 years.  Fecal occult blood test (FOBT) of the stool. You may have this test every year starting at age 36.  Flexible sigmoidoscopy or colonoscopy. You may have a sigmoidoscopy every 5 years or a colonoscopy every 10 years starting at age 59.  Hepatitis C blood test.  Hepatitis B blood test.  Sexually transmitted disease (STD) testing.  Diabetes screening. This is done by checking your blood sugar (glucose) after you have not eaten for a while (fasting). You may have this done every 1-3 years.  Bone density scan. This is done to screen for osteoporosis. You may have this done starting at age 46.  Mammogram. This may be done every 1-2 years. Talk to your health care provider about how often you should have regular mammograms. Talk with your health care provider about your test results, treatment options, and if necessary, the need for  more tests. Vaccines  Your health care provider may recommend certain vaccines, such as:  Influenza vaccine. This is recommended every year.  Tetanus, diphtheria, and acellular pertussis (Tdap, Td) vaccine. You may need a Td booster every 10  years.  Zoster vaccine. You may need this after age 34.  Pneumococcal 13-valent conjugate (PCV13) vaccine. One dose is recommended after age 1.  Pneumococcal polysaccharide (PPSV23) vaccine. One dose is recommended after age 11. Talk to your health care provider about which screenings and vaccines you need and how often you need them. This information is not intended to replace advice given to you by your health care provider. Make sure you discuss any questions you have with your health care provider. Document Released: 10/01/2015 Document Revised: 05/24/2016 Document Reviewed: 07/06/2015 Elsevier Interactive Patient Education  2017 [employer] Prevention in the Home Falls can cause injuries. They can happen to people of all ages. There are many things you can do to make your home safe and to help prevent falls. What can I do on the outside of my home?  Regularly fix the edges of walkways and driveways and fix any cracks.  Remove anything that might make you trip as you walk through a door, such as a raised step or threshold.  Trim any bushes or trees on the path to your home.  Use bright outdoor lighting.  Clear any walking paths of anything that might make someone trip, such as rocks or tools.  Regularly check to see if handrails are loose or broken. Make sure that both sides of any steps have handrails.  Any raised decks and porches should have guardrails on the edges.  Have any leaves, snow, or ice cleared regularly.  Use sand or salt on walking paths during winter.  Clean up any spills in your garage right away. This includes oil or grease spills. What can I do in the bathroom?  Use night lights.  Install grab bars by the toilet and in the tub and shower. Do not use towel bars as grab bars.  Use non-skid mats or decals in the tub or shower.  If you need to sit down in the shower, use a plastic, non-slip stool.  Keep the floor dry. Clean up any water that  spills on the floor as soon as it happens.  Remove soap buildup in the tub or shower regularly.  Attach bath mats securely with double-sided non-slip rug tape.  Do not have throw rugs and other things on the floor that can make you trip. What can I do in the bedroom?  Use night lights.  Make sure that you have a light by your bed that is easy to reach.  Do not use any sheets or blankets that are too big for your bed. They should not hang down onto the floor.  Have a firm chair that has side arms. You can use this for support while you get dressed.  Do not have throw rugs and other things on the floor that can make you trip. What can I do in the kitchen?  Clean up any spills right away.  Avoid walking on wet floors.  Keep items that you use a lot in easy-to-reach places.  If you need to reach something above you, use a strong step stool that has a grab bar.  Keep electrical cords out of the way.  Do not use floor polish or wax that makes floors slippery. If you must use wax, use non-skid  floor wax.  Do not have throw rugs and other things on the floor that can make you trip. What can I do with my stairs?  Do not leave any items on the stairs.  Make sure that there are handrails on both sides of the stairs and use them. Fix handrails that are broken or loose. Make sure that handrails are as long as the stairways.  Check any carpeting to make sure that it is firmly attached to the stairs. Fix any carpet that is loose or worn.  Avoid having throw rugs at the top or bottom of the stairs. If you do have throw rugs, attach them to the floor with carpet tape.  Make sure that you have a light switch at the top of the stairs and the bottom of the stairs. If you do not have them, ask someone to add them for you. What else can I do to help prevent falls?  Wear shoes that:  Do not have high heels.  Have rubber bottoms.  Are comfortable and fit you well.  Are closed at the  toe. Do not wear sandals.  If you use a stepladder:  Make sure that it is fully opened. Do not climb a closed stepladder.  Make sure that both sides of the stepladder are locked into place.  Ask someone to hold it for you, if possible.  Clearly mark and make sure that you can see:  Any grab bars or handrails.  First and last steps.  Where the edge of each step is.  Use tools that help you move around (mobility aids) if they are needed. These include:  Canes.  Walkers.  Scooters.  Crutches.  Turn on the lights when you go into a dark area. Replace any light bulbs as soon as they burn out.  Set up your furniture so you have a clear path. Avoid moving your furniture around.  If any of your floors are uneven, fix them.  If there are any pets around you, be aware of where they are.  Review your medicines with your doctor. Some medicines can make you feel dizzy. This can increase your chance of falling. Ask your doctor what other things that you can do to help prevent falls. This information is not intended to replace advice given to you by your health care provider. Make sure you discuss any questions you have with your health care provider. Document Released: 07/01/2009 Document Revised: 02/10/2016 Document Reviewed: 10/09/2014 Elsevier Interactive Patient Education  2017 [employer].

## 2020-03-10 NOTE — Progress Notes (Signed)
Subjective:   Christine Ray is a 64 y.o. female who presents for an Initial Medicare Annual Wellness Visit.  Virtual Visit via Telephone Note  I connected with  Christine Ray on 03/10/20 at  9:20 AM EDT by telephone and verified that I am speaking with the correct person using two identifiers.  Medicare Annual Wellness visit completed telephonically due to Covid-19 pandemic.   Location: Patient: home Provider: office   I discussed the limitations, risks, security and privacy concerns of performing an evaluation and management service by telephone and the availability of in person appointments. The patient expressed understanding and agreed to proceed.  Unable to perform video visit due to video visit attempted and failed and/or patient does not have video capability.   Some vital signs may be absent or patient reported.   Christine Marker, LPN    Review of Systems     Cardiac Risk Factors include: advanced age (>35men, >103 women);dyslipidemia     Objective:    Today's Vitals   03/10/20 0923  PainSc: 0-No pain   There is no height or weight on file to calculate BMI.  Advanced Directives 03/10/2020 11/30/2019 03/27/2017 11/13/2016  Does Patient Have a Medical Advance Directive? Yes No No Yes  Type of Paramedic of [place];Living will - - Living will  Copy of [place] in Chart? No - copy requested - - -  Would patient like information on creating a medical advance directive? - No - Patient declined No - Patient declined -    Current Medications (verified) Outpatient Encounter Medications as of 03/10/2020  Medication Sig  . Ascorbic Acid (VITAMIN C) 500 MG CAPS Take 1,000 mg by mouth.  Marland Kitchen aspirin EC 81 MG tablet Take 81 mg by mouth daily as needed (learned that it is good to take everyday).  . baclofen (LIORESAL) 10 MG tablet Take 1 tablet (10 mg total) by mouth at bedtime.  . Cholecalciferol (VITAMIN D) 2000 units tablet Take  2,000 Units by mouth daily.  . fluticasone (FLONASE) 50 MCG/ACT nasal spray Place 2 sprays into both nostrils daily.  Marland Kitchen ibandronate (BONIVA) 150 MG tablet Take 1 tablet by mouth every 30 (thirty) days.  . meclizine (ANTIVERT) 25 MG tablet TAKE 1 TABLET THREE TIMES DAILY AS NEEDED  FOR  DIZZINESS  . naproxen (NAPROSYN) 500 MG tablet Take 1 tablet (500 mg total) by mouth 2 (two) times daily with a meal.  . Olopatadine HCl 0.2 % SOLN Place 1 drop into both eyes every evening.  . simvastatin (ZOCOR) 10 MG tablet Take 10 mg by mouth at bedtime.  . solifenacin (VESICARE) 10 MG tablet Take 10 mg by mouth daily.  . timolol (TIMOPTIC) 0.5 % ophthalmic solution Place 1 drop into both eyes in the morning.  . traMADol (ULTRAM) 50 MG tablet Take 1 tablet (50 mg total) by mouth every 6 (six) hours as needed.  . [DISCONTINUED] metroNIDAZOLE (FLAGYL) 500 MG tablet Take 1 tablet (500 mg total) by mouth 3 (three) times daily. (Patient not taking: Reported on 02/04/2020)  . [DISCONTINUED] valACYclovir (VALTREX) 1000 MG tablet Take 1 tablet (1,000 mg total) by mouth 3 (three) times daily.   No facility-administered encounter medications on file as of 03/10/2020.    Allergies (verified) Amoxicillin, Hydrocodone, and Oxycodone hcl   History: Past Medical History:  Diagnosis Date  . Hyperlipidemia   . Lateral epicondylitis of right elbow 01/04/2015   Past Surgical History:  Procedure Laterality Date  .  COLONOSCOPY WITH PROPOFOL N/A 11/13/2016   Procedure: COLONOSCOPY WITH PROPOFOL;  Surgeon: Lollie Sails, MD;  Location: [facility] ENDOSCOPY;  Service: Endoscopy;  Laterality: N/A;   Family History  Problem Relation Age of Onset  . Breast cancer Mother 68  . Prostate cancer Father   . Throat cancer Brother    Social History   Socioeconomic History  . Marital status: Married    Spouse name: Not on file  . Number of children: 0  . Years of education: Not on file  . Highest education level: High school  graduate  Occupational History  . Not on file  Tobacco Use  . Smoking status: Former Smoker    Types: Cigarettes    Quit date: 10/14/1975    Years since quitting: 44.4  . Smokeless tobacco: Never Used  Vaping Use  . Vaping Use: Never used  Substance and Sexual Activity  . Alcohol use: No  . Drug use: No  . Sexual activity: Not on file  Other Topics Concern  . Not on file  Social History Narrative  . Not on file   Social Determinants of Health   Financial Resource Strain: Low Risk   . Difficulty of Paying Living Expenses: Not hard at all  Food Insecurity: No Food Insecurity  . Worried About Charity fundraiser in the Last Year: Never true  . Ran Out of Food in the Last Year: Never true  Transportation Needs: No Transportation Needs  . Lack of Transportation (Medical): No  . Lack of Transportation (Non-Medical): No  Physical Activity: Insufficiently Active  . Days of Exercise per Week: 4 days  . Minutes of Exercise per Session: 30 min  Stress: No Stress Concern Present  . Feeling of Stress : Not at all  Social Connections: Moderately Integrated  . Frequency of Communication with Friends and Family: More than three times a week  . Frequency of Social Gatherings with Friends and Family: Once a week  . Attends Religious Services: More than 4 times per year  . Active Member of Clubs or Organizations: No  . Attends Archivist Meetings: Never  . Marital Status: Married    Tobacco Counseling Counseling given: Not Answered   Clinical Intake:  Pre-visit preparation completed: Yes  Pain : No/denies pain Pain Score: 0-No pain     Nutritional Risks: None Diabetes: No  How often do you need to have someone help you when you read instructions, pamphlets, or other written materials from your doctor or pharmacy?: 1 - Never    Interpreter Needed?: No  Information entered by :: Christine Marker LPN   Activities of Daily Living In your present state of health, do  you have any difficulty performing the following activities: 03/10/2020 06/20/2019  Hearing? N N  Comment declines hearing aids -  Vision? N N  Difficulty concentrating or making decisions? N N  Walking or climbing stairs? N N  Dressing or bathing? N N  Doing errands, shopping? N N  Preparing Food and eating ? N -  Using the Toilet? N -  In the past six months, have you accidently leaked urine? N -  Do you have problems with loss of bowel control? N -  Managing your Medications? N -  Managing your Finances? N -  Housekeeping or managing your Housekeeping? N -  Some recent data might be hidden    Patient Care Team: Glean Hess, MD as PCP - General (Internal Medicine) Schermerhorn, Gwen Her, MD as  Referring Physician (Obstetrics and Gynecology) Earnestine Leys, MD (Orthopedic Surgery) Lollie Sails, MD (Inactive) as Consulting Physician (Gastroenterology)  Indicate any recent Medical Services you may have received from other than [facility] providers in the past year (date may be approximate).     Assessment:   This is a routine wellness examination for [place].  Hearing/Vision screen  Hearing Screening   125Hz  250Hz  500Hz  1000Hz  2000Hz  3000Hz  4000Hz  6000Hz  8000Hz   Right ear:           Left ear:           Comments: Pt denies hearing difficulty  Vision Screening Comments: Annual vision screenings with Dr[facility] Glennon Mac [facility] in [place] issues and exercise activities discussed: Current Exercise Habits: Home exercise routine, Type of exercise: treadmill, Time (Minutes): 30, Frequency (Times/Week): 4, Weekly Exercise (Minutes/Week): 120, Intensity: Moderate, Exercise limited by: None identified  Goals   None    Depression Screen PHQ 2/9 Scores 03/10/2020 01/29/2020 12/02/2019 06/20/2019 01/29/2019 05/22/2018  PHQ - 2 Score 0 0 0 0 0 0  PHQ- 9 Score - 0 - - - -    Fall Risk Fall Risk  03/10/2020 01/29/2020 01/29/2019  Falls in the past year? 0 0 0  Number falls  in past yr: 0 0 0  Injury with Fall? 0 0 0  Risk for fall due to : No Fall Risks No Fall Risks -  Follow up Falls prevention discussed Falls evaluation completed Falls evaluation completed    Any stairs in or around the home? Yes  If so, are there any without handrails? Yes  Home free of loose throw rugs in walkways, pet beds, electrical cords, etc? Yes  Adequate lighting in your home to reduce risk of falls? Yes   ASSISTIVE DEVICES UTILIZED TO PREVENT FALLS:  Life alert? No  Use of a cane, walker or w/c? No  Grab bars in the bathroom? No  Shower chair or bench in shower? No  Elevated toilet seat or a handicapped toilet? No   TIMED UP AND GO:  Was the test performed? No . Telephonic visit.   Cognitive Function: 6CIT deferred for 2021 AWV; pt has no memory issues        Immunizations Immunization History  Administered Date(s) Administered  . Fluad Quad(high Dose 65+) 06/13/2019  . Influenza Inj Mdck Quad With Preservative 06/20/2018  . Influenza,inj,Quad PF,6+ Mos 10/27/2016  . Influenza-Unspecified 06/20/2018  . Moderna SARS-COVID-2 Vaccination 10/05/2019, 11/02/2019  . Pneumococcal Conjugate-13 01/29/2019  . Tdap 06/11/2018  . Zoster Recombinat (Shingrix) 05/15/2018, 07/19/2018    TDAP status: Up to date   Flu Vaccine status: Up to date   Pneumococcal vaccine status: Declined,  Education has been provided regarding the importance of this vaccine but patient still declined. Advised may receive this vaccine at local pharmacy or Health Dept. Aware to provide a copy of the vaccination record if obtained from local pharmacy or Health Dept. Verbalized acceptance and understanding.    Covid-19 vaccine status: Completed vaccines  Qualifies for Shingles Vaccine? Yes   Shingrix Completed?: Yes  Screening Tests Health Maintenance  Topic Date Due  . Hepatitis C Screening  Never done  . PNA vac Low Risk Adult (2 of 2 - PPSV23) 01/29/2020  . INFLUENZA VACCINE  04/18/2020    . MAMMOGRAM  07/08/2021  . COLONOSCOPY  11/13/2026  . TETANUS/TDAP  06/11/2028  . DEXA SCAN  Completed  . COVID-19 Vaccine  Completed    Health Maintenance  Health  Maintenance Due  Topic Date Due  . Hepatitis C Screening  Never done  . PNA vac Low Risk Adult (2 of 2 - PPSV23) 01/29/2020   Cancer Screenings:  Colorectal Screening: Completed 07/13/17. Repeat every 10 years;   Mammogram: Completed 07/09/19. Repeat every year;   Bone Density: Completed 06/17/18. Results reflect  OSTEOPOROSIS. Repeat every 2 years. Ordered by GYN.   Lung Cancer Screening: (Low Dose CT Chest recommended if Age 65-80 years, 30 pack-year currently smoking OR have quit w/in 15years.) does not qualify.   Additional Screening:  Hepatitis C Screening: does qualify; postponed  Vision Screening: Recommended annual ophthalmology exams for early detection of glaucoma and other disorders of the eye. Is the patient up to date with their annual eye exam?  Yes  Who is the provider or what is the name of the office in which the patient attends annual eye exams? [facility]  Dental Screening: Recommended annual dental exams for proper oral hygiene  Community Resource Referral / Chronic Care Management: CRR required this visit?  No   CCM required this visit?  No      Plan:     I have personally reviewed and noted the following in the patient's chart:   . Medical and social history . Use of alcohol, tobacco or illicit drugs  . Current medications and supplements . Functional ability and status . Nutritional status . Physical activity . Advanced directives . List of other physicians . Hospitalizations, surgeries, and ER visits in previous 12 months . Vitals . Screenings to include cognitive, depression, and falls . Referrals and appointments  In addition, I have reviewed and discussed with patient certain preventive protocols, quality metrics, and best practice recommendations.  A written personalized care plan for preventive services as well as general preventive health recommendations were provided to patient.     Christine Marker, LPN   [id number]   Nurse Notes: none

## 2020-03-23 ENCOUNTER — Other Ambulatory Visit: Payer: Self-pay | Admitting: Internal Medicine

## 2020-03-23 DIAGNOSIS — R42 Dizziness and giddiness: Secondary | ICD-10-CM

## 2020-04-07 DIAGNOSIS — H401131 Primary open-angle glaucoma, bilateral, mild stage: Secondary | ICD-10-CM | POA: Diagnosis not present

## 2020-05-04 ENCOUNTER — Encounter: Payer: Self-pay | Admitting: Internal Medicine

## 2020-05-04 ENCOUNTER — Other Ambulatory Visit: Payer: Self-pay

## 2020-05-04 ENCOUNTER — Ambulatory Visit (INDEPENDENT_AMBULATORY_CARE_PROVIDER_SITE_OTHER): Payer: Medicare HMO | Admitting: Internal Medicine

## 2020-05-04 VITALS — BP 122/88 | HR 55 | Temp 98.5°F | Ht 71.0 in | Wt 206.0 lb

## 2020-05-04 DIAGNOSIS — M62830 Muscle spasm of back: Secondary | ICD-10-CM | POA: Diagnosis not present

## 2020-05-04 DIAGNOSIS — M778 Other enthesopathies, not elsewhere classified: Secondary | ICD-10-CM

## 2020-05-04 MED ORDER — BACLOFEN 10 MG PO TABS: 10.0000 mg | ORAL_TABLET | Freq: Every day | ORAL | 0 refills | Status: DC

## 2020-05-04 MED ORDER — PREDNISONE 10 MG PO TABS: 10.0000 mg | ORAL_TABLET | ORAL | 0 refills | Status: AC

## 2020-05-04 NOTE — Progress Notes (Signed)
Date:  05/04/2020   Name:  Christine Ray   DOB:  Mar 08, 1956   MRN:  [id number]   Chief Complaint: Shoulder Pain (X2 weeks, left shoulder, moving boxes may be the cause, painful on shoulder and arm, hurts to raise and move arm)  Shoulder Pain  The pain is present in the left shoulder and left arm. This is a new problem. The current episode started 1 to 4 weeks ago. The problem occurs constantly. The problem has been unchanged. The quality of the pain is described as aching. The pain is at a severity of 4/10. The pain is moderate. Pertinent negatives include no fever. The symptoms are aggravated by activity. She has tried acetaminophen, heat, NSAIDS and rest for the symptoms. The treatment provided mild relief.    Lab Results  Component Value Date   CREATININE 0.88 11/30/2019   BUN 19 11/30/2019   NA 140 11/30/2019   K 3.8 11/30/2019   CL 105 11/30/2019   CO2 27 11/30/2019   Lab Results  Component Value Date   CHOL 190 06/18/2019   HDL 52 06/18/2019   LDLCALC 120 06/18/2019   TRIG 106 06/17/2018   CHOLHDL 4.1 10/27/2016   Lab Results  Component Value Date   TSH 0.566 12/02/2019   No results found for: HGBA1C Lab Results  Component Value Date   WBC 9.3 11/30/2019   HGB 13.7 11/30/2019   HCT 44.1 11/30/2019   MCV 86.8 11/30/2019   PLT 191 11/30/2019   Lab Results  Component Value Date   ALT 15 06/18/2019   AST 18 06/18/2019   ALKPHOS 84 03/27/2017   BILITOT 0.9 03/27/2017     Review of Systems  Constitutional: Negative for chills, fatigue and fever.  HENT: Positive for sinus pressure.   Respiratory: Negative for chest tightness and shortness of breath.   Cardiovascular: Negative for chest pain and palpitations.  Musculoskeletal: Positive for arthralgias.  Neurological: Negative for dizziness, weakness, light-headedness and headaches.    Patient Active Problem List   Diagnosis Date Noted  . Chronic pain of left knee 04/06/2017  . Hyperlipidemia  04/26/2016  . Vitamin D deficiency 04/26/2016  . BPPV (benign paroxysmal positional vertigo) 04/26/2016  . Varicose veins of both lower extremities 04/26/2016  . GERD (gastroesophageal reflux disease) 01/04/2015  . Osteoporosis 01/04/2015    Allergies  Allergen Reactions  . Amoxicillin Hives  . Hydrocodone Other (See Comments)    headache  . Oxycodone Hcl Rash    Past Surgical History:  Procedure Laterality Date  . COLONOSCOPY WITH PROPOFOL N/A 11/13/2016   Procedure: COLONOSCOPY WITH PROPOFOL;  Surgeon: Lollie Sails, MD;  Location: [facility] ENDOSCOPY;  Service: Endoscopy;  Laterality: N/A;    Social History   Tobacco Use  . Smoking status: Former Smoker    Types: Cigarettes    Quit date: 10/14/1975    Years since quitting: 44.5  . Smokeless tobacco: Never Used  Vaping Use  . Vaping Use: Never used  Substance Use Topics  . Alcohol use: No  . Drug use: No     Medication list has been reviewed and updated.  Current Meds  Medication Sig  . Ascorbic Acid (VITAMIN C) 500 MG CAPS Take 1,000 mg by mouth.  Marland Kitchen aspirin EC 81 MG tablet Take 81 mg by mouth daily as needed (learned that it is good to take everyday).  . baclofen (LIORESAL) 10 MG tablet Take 1 tablet (10 mg total) by mouth at bedtime.  Marland Kitchen  Cholecalciferol (VITAMIN D) 2000 units tablet Take 2,000 Units by mouth daily.  . fluticasone (FLONASE) 50 MCG/ACT nasal spray Place 2 sprays into both nostrils daily.  Marland Kitchen ibandronate (BONIVA) 150 MG tablet Take 1 tablet by mouth every 30 (thirty) days.  . meclizine (ANTIVERT) 25 MG tablet TAKE 1 TABLET THREE TIMES DAILY AS NEEDED  FOR  DIZZINESS  . Olopatadine HCl 0.2 % SOLN Place 1 drop into both eyes every evening.  . simvastatin (ZOCOR) 10 MG tablet Take 10 mg by mouth at bedtime.  . solifenacin (VESICARE) 10 MG tablet Take 10 mg by mouth daily.  . timolol (TIMOPTIC) 0.5 % ophthalmic solution Place 1 drop into both eyes in the morning.  . traMADol (ULTRAM) 50 MG tablet Take 1  tablet (50 mg total) by mouth every 6 (six) hours as needed.    PHQ 2/9 Scores 03/10/2020 01/29/2020 12/02/2019 06/20/2019  PHQ - 2 Score 0 0 0 0  PHQ- 9 Score - 0 - -    No flowsheet data found.  BP Readings from Last 3 Encounters:  05/04/20 122/88  02/04/20 125/85  01/29/20 122/78    Physical Exam Vitals and nursing note reviewed.  Constitutional:      General: She is not in acute distress.    Appearance: She is well-developed.  HENT:     Head: Normocephalic and atraumatic.  Cardiovascular:     Rate and Rhythm: Normal rate and regular rhythm.     Pulses: Normal pulses.  Pulmonary:     Effort: Pulmonary effort is normal. No respiratory distress.     Breath sounds: No wheezing or rhonchi.  Musculoskeletal:     Left shoulder: Tenderness present. No swelling. Decreased range of motion. Normal strength. Normal pulse.     Cervical back: Spasms present. No tenderness or bony tenderness. Decreased range of motion.     Right lower leg: No edema.     Left lower leg: No edema.  Skin:    General: Skin is warm and dry.     Findings: No rash.  Neurological:     Mental Status: She is alert and oriented to person, place, and time.     Sensory: Sensation is intact.     Motor: Motor function is intact.  Psychiatric:        Behavior: Behavior normal.        Thought Content: Thought content normal.     Wt Readings from Last 3 Encounters:  05/04/20 206 lb (93.4 kg)  02/04/20 203 lb (92.1 kg)  01/29/20 206 lb (93.4 kg)    BP 122/88   Pulse (!) 55   Temp 98.5 F (36.9 C) (Oral)   Ht 5\' 11"  (1.803 m)   Wt 206 lb (93.4 kg)   SpO2 98%   BMI 28.73 kg/m   Assessment and Plan: 1. Tendonitis of shoulder, left Continue topical rubs, tylenol, heat or ice Add steroid taper - if no improvement will refer to Ortho - predniSONE (DELTASONE) 10 MG tablet; Take 1 tablet (10 mg total) by mouth as directed for 6 days. Take 6,5,4,3,2,1 then stop  Dispense: 21 tablet; Refill: 0  2. Muscle  spasm of back - baclofen (LIORESAL) 10 MG tablet; Take 1 tablet (10 mg total) by mouth at bedtime.  Dispense: 30 each; Refill: 0   Partially dictated using Editor, commissioning. Any errors are unintentional.  Halina Maidens, MD [facility]  05/04/2020

## 2020-05-07 ENCOUNTER — Ambulatory Visit: Payer: Medicare HMO | Admitting: Internal Medicine

## 2020-06-03 ENCOUNTER — Telehealth: Payer: Self-pay | Admitting: Internal Medicine

## 2020-06-03 NOTE — Telephone Encounter (Signed)
Pt stated she is still having pain in her left shoulder since her 8.20.21 visit  / Pt was prescribed prednisone but it has not helped so she was asking for a refill for Meloxicam / Pt thought she received this Rx from Dr. Army Melia but I advised her when and where it was from/ Pt stated that when she broke her foot the Meloxicam really helped with the pain/ Pt asked if Dr. Army Melia can send in something to [place] that would work better for her shoulder than the prednisone/ please advise

## 2020-06-03 NOTE — Telephone Encounter (Signed)
Left message for pt. To call back. Meloxicam is not on her medication list.

## 2020-06-03 NOTE — Telephone Encounter (Signed)
Medication Refill - Medication: Meloxicam   Has the patient contacted their pharmacy? Yes.   Pt called stating that she is still having shoulder pain. Please advise.  (Agent: If no, request that the patient contact the pharmacy for the refill.) (Agent: If yes, when and what did the pharmacy advise?)  Preferred Pharmacy (with phone number or street name):  [place], [address]  [place] [address]  Phone: [phone] Fax: [phone]  Hours: Not open 24 hours               agent: Please be advised that RX refills may take up to 3 business days. We ask that you follow-up with your pharmacy.

## 2020-06-03 NOTE — Telephone Encounter (Signed)
Please call her and tell her they are out of the office and will return on Monday

## 2020-06-07 ENCOUNTER — Other Ambulatory Visit: Payer: Self-pay

## 2020-06-07 DIAGNOSIS — M25512 Pain in left shoulder: Secondary | ICD-10-CM

## 2020-06-07 NOTE — Progress Notes (Signed)
orthop

## 2020-06-07 NOTE — Telephone Encounter (Signed)
Patient informed and placed orthopedic referral.   CM

## 2020-06-07 NOTE — Telephone Encounter (Signed)
Yes, just refer her to Ortho since it has been one month and she is not improved.

## 2020-06-10 DIAGNOSIS — M25512 Pain in left shoulder: Secondary | ICD-10-CM | POA: Diagnosis not present

## 2020-06-12 ENCOUNTER — Other Ambulatory Visit: Payer: Self-pay | Admitting: Internal Medicine

## 2020-06-22 ENCOUNTER — Other Ambulatory Visit: Payer: Self-pay | Admitting: Obstetrics and Gynecology

## 2020-06-22 DIAGNOSIS — M816 Localized osteoporosis [Lequesne]: Secondary | ICD-10-CM | POA: Diagnosis not present

## 2020-06-22 DIAGNOSIS — Z8639 Personal history of other endocrine, nutritional and metabolic disease: Secondary | ICD-10-CM | POA: Diagnosis not present

## 2020-06-22 DIAGNOSIS — Z124 Encounter for screening for malignant neoplasm of cervix: Secondary | ICD-10-CM | POA: Diagnosis not present

## 2020-06-22 DIAGNOSIS — Z1231 Encounter for screening mammogram for malignant neoplasm of breast: Secondary | ICD-10-CM

## 2020-06-22 DIAGNOSIS — Z1211 Encounter for screening for malignant neoplasm of colon: Secondary | ICD-10-CM | POA: Diagnosis not present

## 2020-06-22 DIAGNOSIS — Z23 Encounter for immunization: Secondary | ICD-10-CM | POA: Diagnosis not present

## 2020-06-22 DIAGNOSIS — R102 Pelvic and perineal pain: Secondary | ICD-10-CM | POA: Diagnosis not present

## 2020-06-22 DIAGNOSIS — Z1331 Encounter for screening for depression: Secondary | ICD-10-CM | POA: Diagnosis not present

## 2020-06-28 DIAGNOSIS — E785 Hyperlipidemia, unspecified: Secondary | ICD-10-CM | POA: Diagnosis not present

## 2020-06-28 DIAGNOSIS — Z01419 Encounter for gynecological examination (general) (routine) without abnormal findings: Secondary | ICD-10-CM | POA: Diagnosis not present

## 2020-06-28 DIAGNOSIS — M8588 Other specified disorders of bone density and structure, other site: Secondary | ICD-10-CM | POA: Diagnosis not present

## 2020-06-28 DIAGNOSIS — M816 Localized osteoporosis [Lequesne]: Secondary | ICD-10-CM | POA: Diagnosis not present

## 2020-06-28 LAB — BASIC METABOLIC PANEL
BUN: 14 (ref 4–21)
CO2: 34 — AB (ref 13–22)
Chloride: 103 (ref 99–108)
Creatinine: 0.9 (ref 0.5–1.1)
Glucose: 81
Potassium: 4 (ref 3.4–5.3)
Sodium: 140 (ref 137–147)

## 2020-06-28 LAB — LIPID PANEL
Cholesterol: 199 (ref 0–200)
HDL: 55 (ref 35–70)
LDL Cholesterol: 117
Triglycerides: 133 (ref 40–160)

## 2020-06-28 LAB — CBC AND DIFFERENTIAL
HCT: 42 (ref 36–46)
Hemoglobin: 13.5 (ref 12.0–16.0)
Platelets: 147 — AB (ref 150–399)
WBC: 6.8

## 2020-07-06 DIAGNOSIS — M25512 Pain in left shoulder: Secondary | ICD-10-CM | POA: Diagnosis not present

## 2020-07-13 ENCOUNTER — Other Ambulatory Visit: Payer: Self-pay

## 2020-07-13 ENCOUNTER — Ambulatory Visit
Admission: RE | Admit: 2020-07-13 | Discharge: 2020-07-13 | Disposition: A | Discharge status: Home / Self Care | Payer: Medicare HMO | Source: Ambulatory Visit | Attending: Obstetrics and Gynecology | Admitting: Obstetrics and Gynecology

## 2020-07-13 DIAGNOSIS — Z1231 Encounter for screening mammogram for malignant neoplasm of breast: Secondary | ICD-10-CM

## 2020-07-20 DIAGNOSIS — Z23 Encounter for immunization: Secondary | ICD-10-CM | POA: Diagnosis not present

## 2020-07-20 DIAGNOSIS — Z7189 Other specified counseling: Secondary | ICD-10-CM | POA: Diagnosis not present

## 2020-08-03 ENCOUNTER — Other Ambulatory Visit: Payer: Self-pay | Admitting: Internal Medicine

## 2020-08-03 DIAGNOSIS — R42 Dizziness and giddiness: Secondary | ICD-10-CM

## 2020-08-03 NOTE — Telephone Encounter (Signed)
Requested medication (s) are due for refill today:  Yes  Requested medication (s) are on the active medication list:  Yes  Future visit scheduled:   No  Last Refill: 03/23/20; #30; no refills  Notes to clinic: not delegated  Requested Prescriptions  Pending Prescriptions Disp Refills   meclizine (ANTIVERT) 25 MG tablet [Pharmacy Med Name: MECLIZINE HYDROCHLORIDE 25 MG Tablet] 30 tablet 0    Sig: TAKE 1 TABLET THREE TIMES DAILY AS NEEDED  FOR  DIZZINESS      Not Delegated - Gastroenterology: Antiemetics Failed - 08/03/2020  8:57 AM      Failed - This refill cannot be delegated      Passed - Valid encounter within last 6 months    Recent Outpatient Visits           3 months ago Tendonitis of shoulder, left   [facility] Glean Hess, MD   6 months ago Chronic pain of left knee   [facility] Glean Hess, MD   8 months ago [facility] Glean Hess, MD   1 year ago Patellar tendonitis of both knees   [facility] Glean Hess, MD   1 year ago Sinus congestion   [facility] Glean Hess, MD

## 2020-10-12 DIAGNOSIS — H401131 Primary open-angle glaucoma, bilateral, mild stage: Secondary | ICD-10-CM | POA: Diagnosis not present

## 2021-01-12 IMAGING — MG DIGITAL SCREENING BILAT W/ TOMO W/ CAD
8 series · 8 of 24 positions shown · non-contrast
Comparison: Previous exam(s).

CLINICAL DATA: Screening.

EXAM:
DIGITAL SCREENING BILATERAL MAMMOGRAM WITH TOMO AND CAD

[R MLO synth-2D]
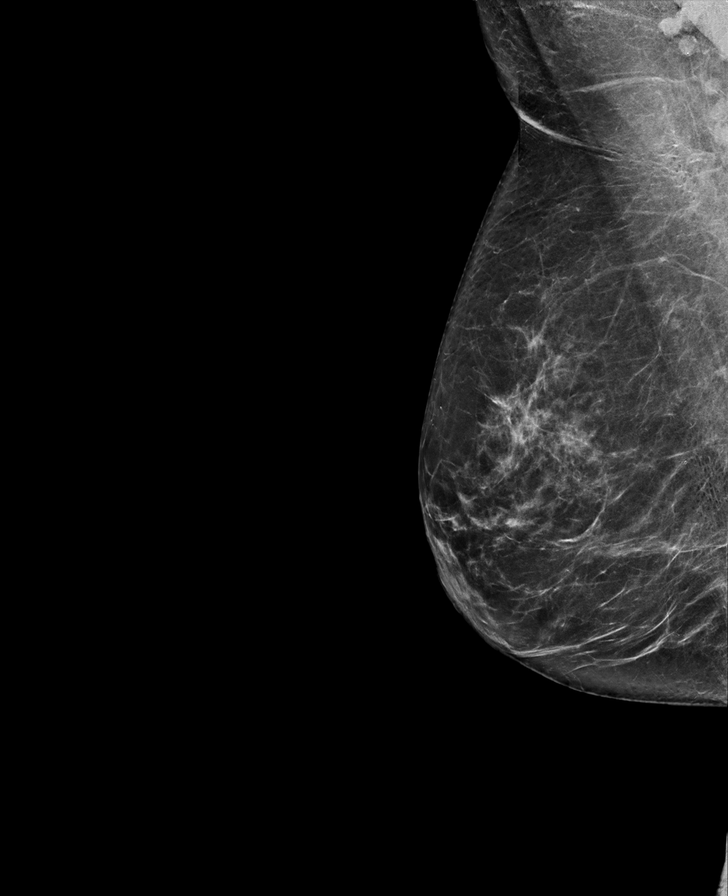

[L MLO synth-2D]
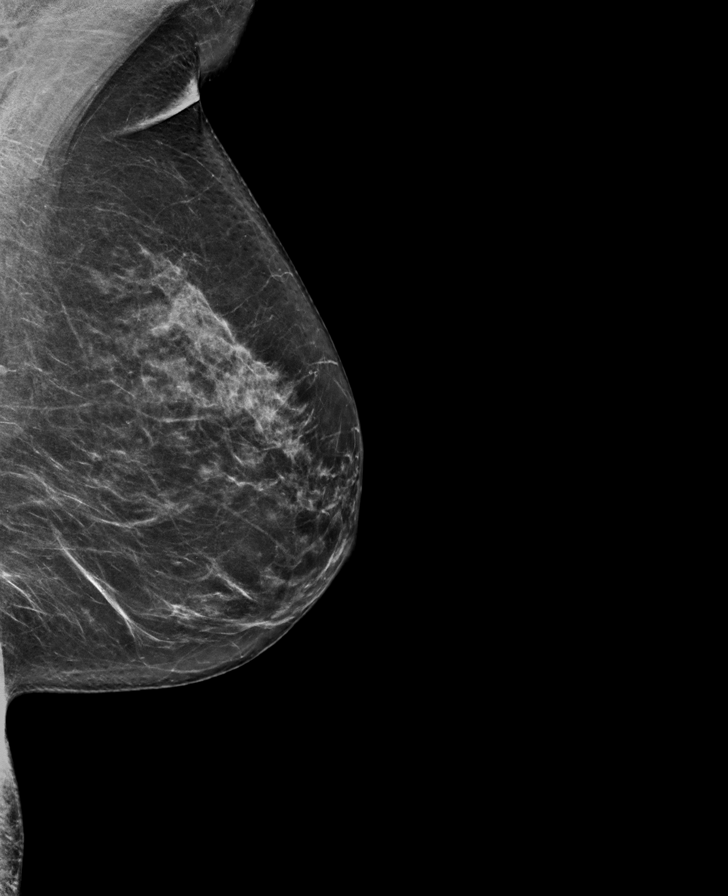

[L CC synth-2D]
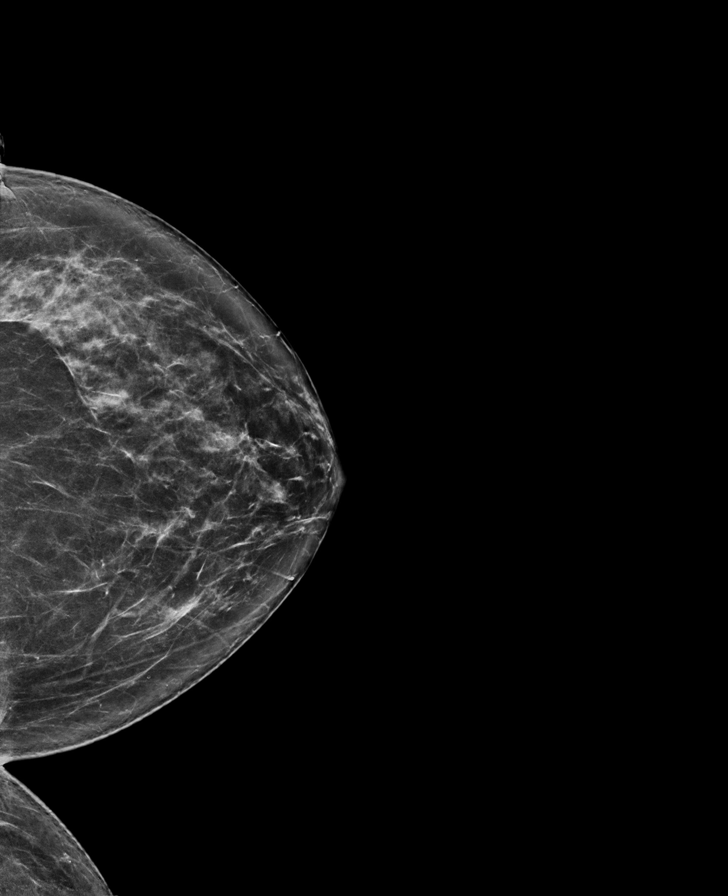

[R CC synth-2D]
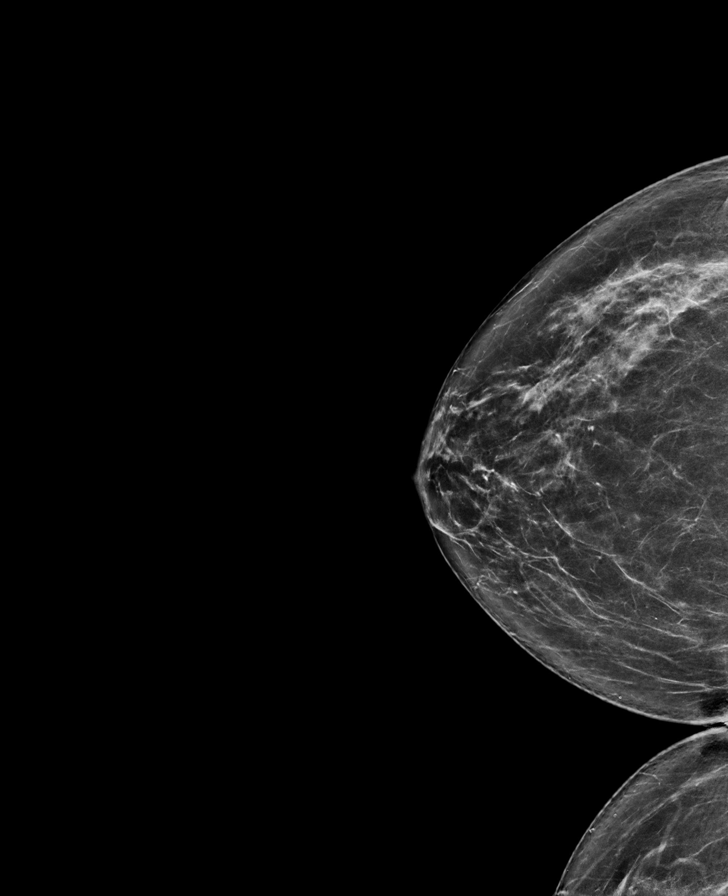

[R CC tomo · tomo slice 37/74.0]
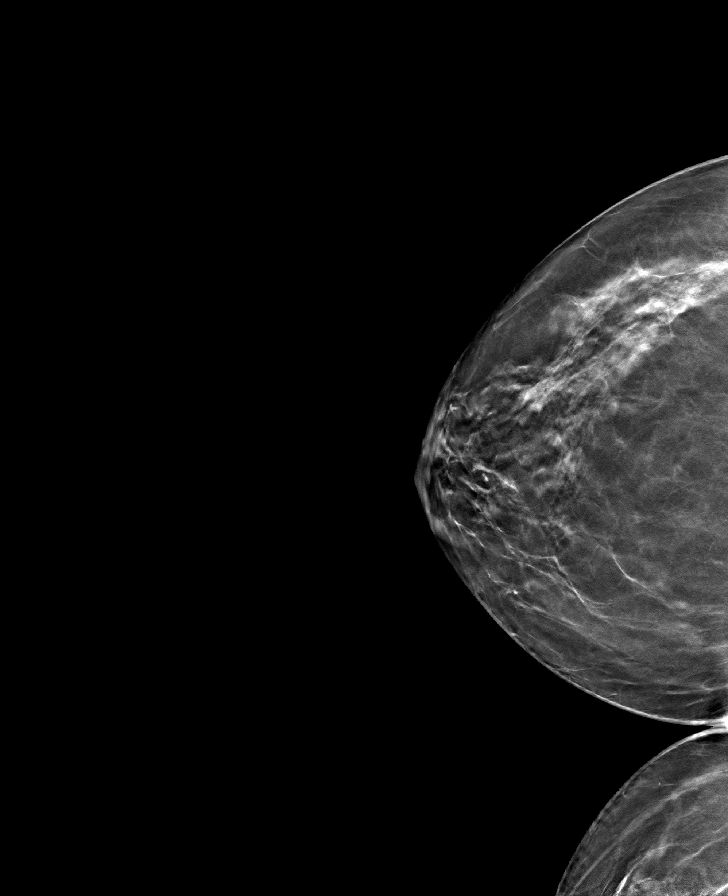

[L MLO tomo · tomo slice 37/73.0]
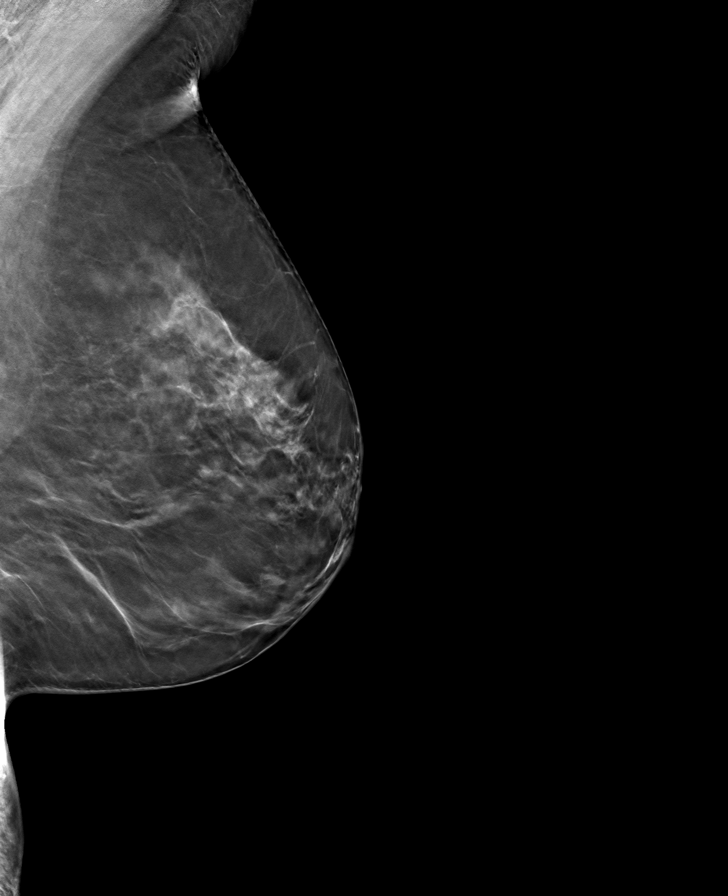

[R MLO tomo · tomo slice 39/76.0]
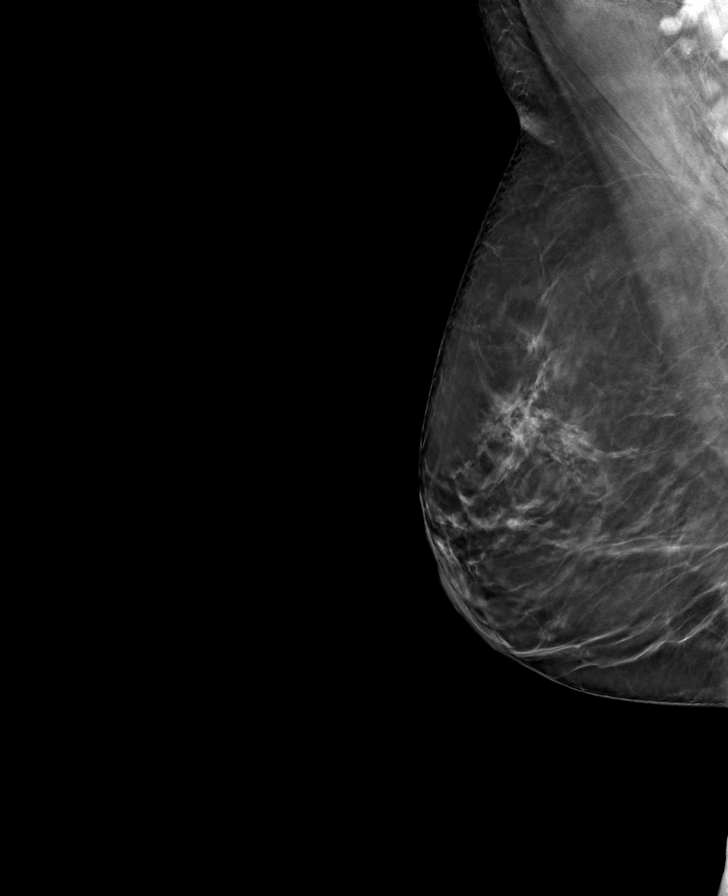

[L CC tomo · tomo slice 37/74.0]
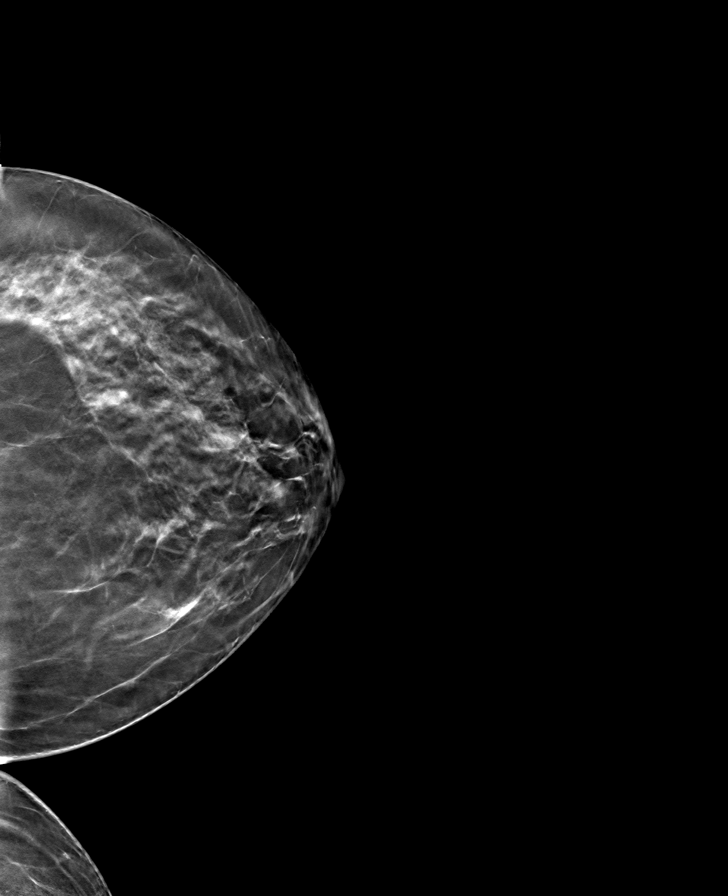

[8 of 24 positions shown; findings below may reference images not displayed]

ACR Breast Density Category b: There are scattered areas of
fibroglandular density.
FINDINGS: There are no findings suspicious for malignancy. Images were
processed with CAD.
IMPRESSION: No mammographic evidence of malignancy. A result letter of this
screening mammogram will be mailed directly to the patient.

RECOMMENDATION:
Screening mammogram in one year. (Code:[id number])

BI-RADS CATEGORY  1: Negative.

## 2021-02-16 ENCOUNTER — Encounter: Payer: Self-pay | Admitting: Internal Medicine

## 2021-02-16 ENCOUNTER — Ambulatory Visit (INDEPENDENT_AMBULATORY_CARE_PROVIDER_SITE_OTHER): Payer: Medicare HMO | Admitting: Internal Medicine

## 2021-02-16 ENCOUNTER — Other Ambulatory Visit: Payer: Self-pay

## 2021-02-16 VITALS — BP 128/74 | HR 78 | Ht 71.0 in | Wt 204.6 lb

## 2021-02-16 DIAGNOSIS — M79604 Pain in right leg: Secondary | ICD-10-CM | POA: Diagnosis not present

## 2021-02-16 DIAGNOSIS — L609 Nail disorder, unspecified: Secondary | ICD-10-CM | POA: Diagnosis not present

## 2021-02-16 DIAGNOSIS — M79671 Pain in right foot: Secondary | ICD-10-CM

## 2021-02-16 NOTE — Progress Notes (Signed)
Date:  02/16/2021   Name:  Christine Ray   DOB:  13-Apr-1956   MRN:  [id number]   Chief Complaint: Toe Pain (Great right toe. Heavy box fell on her foot in February. Her toenail is lose, and it throbs in pain. ) and Leg Pain (Right leg has sharp pains. When sitting she feels a burning sensation in leg.)  Leg Pain  There was no injury mechanism. The pain is present in the right hip and right thigh. The quality of the pain is described as cramping (posterior right upper leg). The pain is mild. The pain has been fluctuating since onset. She reports no foreign bodies present. Exacerbated by: getting up from sitting, prolonged sitting. She has tried acetaminophen for the symptoms. The treatment provided mild relief.   Nail issues - right great toe nail. Floor tiles fell onto her toe in Feb.  The nail is now loose but no longer painful.  She is worried about infection.  Lab Results  Component Value Date   CREATININE 0.88 11/30/2019   BUN 19 11/30/2019   NA 140 11/30/2019   K 3.8 11/30/2019   CL 105 11/30/2019   CO2 27 11/30/2019   Lab Results  Component Value Date   CHOL 190 06/18/2019   HDL 52 06/18/2019   LDLCALC 120 06/18/2019   TRIG 106 06/17/2018   CHOLHDL 4.1 10/27/2016   Lab Results  Component Value Date   TSH 0.566 12/02/2019   No results found for: HGBA1C Lab Results  Component Value Date   WBC 9.3 11/30/2019   HGB 13.7 11/30/2019   HCT 44.1 11/30/2019   MCV 86.8 11/30/2019   PLT 191 11/30/2019   Lab Results  Component Value Date   ALT 15 06/18/2019   AST 18 06/18/2019   ALKPHOS 84 03/27/2017   BILITOT 0.9 03/27/2017     Review of Systems  Constitutional: Negative for fatigue and unexpected weight change.  HENT: Negative for nosebleeds.   Eyes: Negative for visual disturbance.  Respiratory: Negative for cough, chest tightness, shortness of breath and wheezing.   Cardiovascular: Negative for chest pain, palpitations and leg swelling (from VV).   Gastrointestinal: Negative for abdominal pain, constipation and diarrhea.  Musculoskeletal: Positive for arthralgias and myalgias. Negative for gait problem.  Neurological: Negative for dizziness, weakness, light-headedness and headaches.    Patient Active Problem List   Diagnosis Date Noted  . Chronic pain of left knee 04/06/2017  . Hyperlipidemia 04/26/2016  . Vitamin D deficiency 04/26/2016  . BPPV (benign paroxysmal positional vertigo) 04/26/2016  . Varicose veins of both lower extremities 04/26/2016  . GERD (gastroesophageal reflux disease) 01/04/2015  . Osteoporosis 01/04/2015    Allergies  Allergen Reactions  . Amoxicillin Hives  . Hydrocodone Other (See Comments)    headache  . Oxycodone Hcl Rash    Past Surgical History:  Procedure Laterality Date  . COLONOSCOPY WITH PROPOFOL N/A 11/13/2016   Procedure: COLONOSCOPY WITH PROPOFOL;  Surgeon: Lollie Sails, MD;  Location: [facility] ENDOSCOPY;  Service: Endoscopy;  Laterality: N/A;    Social History   Tobacco Use  . Smoking status: Former Smoker    Types: Cigarettes    Quit date: 10/14/1975    Years since quitting: 45.3  . Smokeless tobacco: Never Used  Vaping Use  . Vaping Use: Never used  Substance Use Topics  . Alcohol use: No  . Drug use: No     Medication list has been reviewed and updated.  Current Meds  Medication Sig  . Ascorbic Acid (VITAMIN C) 500 MG CAPS Take 1,000 mg by mouth.  Marland Kitchen aspirin EC 81 MG tablet Take 81 mg by mouth daily as needed (learned that it is good to take everyday).  . baclofen (LIORESAL) 10 MG tablet Take 1 tablet (10 mg total) by mouth at bedtime.  . Cholecalciferol (VITAMIN D) 2000 units tablet Take 2,000 Units by mouth daily.  . fluticasone (FLONASE) 50 MCG/ACT nasal spray USE 2 SPRAYS IN EACH NOSTRIL EVERY DAY  . ibandronate (BONIVA) 150 MG tablet Take 1 tablet by mouth every 30 (thirty) days.  . meclizine (ANTIVERT) 25 MG tablet TAKE 1 TABLET THREE TIMES DAILY AS NEEDED   FOR  DIZZINESS  . Olopatadine HCl 0.2 % SOLN Place 1 drop into both eyes every evening.  . simvastatin (ZOCOR) 10 MG tablet Take 10 mg by mouth at bedtime.  . solifenacin (VESICARE) 10 MG tablet Take 10 mg by mouth daily.  . timolol (TIMOPTIC) 0.5 % ophthalmic solution Place 1 drop into both eyes in the morning.  . traMADol (ULTRAM) 50 MG tablet Take 1 tablet (50 mg total) by mouth every 6 (six) hours as needed.    PHQ 2/9 Scores 02/16/2021 03/10/2020 01/29/2020 12/02/2019  PHQ - 2 Score 0 0 0 0  PHQ- 9 Score 0 - 0 -    GAD 7 : Generalized Anxiety Score 02/16/2021  Nervous, Anxious, on Edge 0  Control/stop worrying 0  Worry too much - different things 0  Trouble relaxing 0  Restless 0  Easily annoyed or irritable 0  Afraid - awful might happen 0  Total GAD 7 Score 0  Anxiety Difficulty Not difficult at all    BP Readings from Last 3 Encounters:  02/16/21 128/74  05/04/20 122/88  02/04/20 125/85    Physical Exam Vitals and nursing note reviewed.  Constitutional:      General: She is not in acute distress.    Appearance: She is well-developed.  HENT:     Head: Normocephalic and atraumatic.  Cardiovascular:     Rate and Rhythm: Normal rate and regular rhythm.  Pulmonary:     Effort: Pulmonary effort is normal. No respiratory distress.     Breath sounds: No wheezing or rhonchi.  Musculoskeletal:        General: Swelling present.     Right hip: Normal.     Left hip: Normal.     Right upper leg: Tenderness present.     Left upper leg: Normal.     Right knee: Normal.     Left knee: Normal.     Right lower leg: Edema present.     Left lower leg: Edema present.     Right foot: Bony tenderness (lateral foot - pea sized mobile firm mass) present.       Legs:  Skin:    General: Skin is warm and dry.     Findings: No rash.     Comments: Right great toe nail loose on distal half - no infection or evidence fungal involvement  Neurological:     Mental Status: She is alert and  oriented to person, place, and time.  Psychiatric:        Mood and Affect: Mood normal.        Behavior: Behavior normal.     Wt Readings from Last 3 Encounters:  02/16/21 204 lb 9.6 oz (92.8 kg)  05/04/20 206 lb (93.4 kg)  02/04/20 203 lb (92.1 kg)  BP 128/74   Pulse 78   Ht 5\' 11"  (1.803 m)   Wt 204 lb 9.6 oz (92.8 kg)   SpO2 98%   BMI 28.54 kg/m   Assessment and Plan: 1. Leg pain, posterior, right Suspect gluteal strain - offered SM evaluation here but she prefers to see ORtho Referral placed - Ambulatory referral to Orthopedic Surgery  2. Nail abnormality Nail slightly loose due to trauma - no evidence of infection Pt is reassured  3. Foot pain, right She has lower leg swelling due to varicose veins The mobile mass is likely a sesamoid bone vs cyst She can discuss this with Ortho if needed   Partially dictated using Editor, commissioning. Any errors are unintentional.  Halina Maidens, MD [facility]  02/16/2021

## 2021-02-17 ENCOUNTER — Other Ambulatory Visit: Payer: Self-pay | Admitting: Internal Medicine

## 2021-02-17 DIAGNOSIS — R42 Dizziness and giddiness: Secondary | ICD-10-CM

## 2021-02-17 NOTE — Telephone Encounter (Signed)
Requested medication (s) are due for refill today: no   Requested medication (s) are on the active medication list:  yes  Last refill:  08/04/2020  Future visit scheduled: yes   Notes to clinic:  this refill cannot be delegated    Requested Prescriptions  Pending Prescriptions Disp Refills   meclizine (ANTIVERT) 25 MG tablet [Pharmacy Med Name: Lilly 25 MG Tablet] 30 tablet 0    Sig: TAKE 1 TABLET THREE TIMES DAILY AS NEEDED  FOR  DIZZINESS      Not Delegated - Gastroenterology: Antiemetics Failed - 02/17/2021 10:16 AM      Failed - This refill cannot be delegated      Passed - Valid encounter within last 6 months    Recent Outpatient Visits           Yesterday Leg pain, posterior, right   [facility] Glean Hess, MD   9 months ago Tendonitis of shoulder, left   [facility] Glean Hess, MD   1 year ago Chronic pain of left knee   [facility] Glean Hess, MD   1 year ago [facility] Glean Hess, MD   1 year ago Patellar tendonitis of both knees   [facility] Glean Hess, MD

## 2021-02-21 DIAGNOSIS — M5431 Sciatica, right side: Secondary | ICD-10-CM | POA: Diagnosis not present

## 2021-02-21 DIAGNOSIS — M25561 Pain in right knee: Secondary | ICD-10-CM | POA: Diagnosis not present

## 2021-03-14 ENCOUNTER — Ambulatory Visit (INDEPENDENT_AMBULATORY_CARE_PROVIDER_SITE_OTHER): Payer: Medicare HMO

## 2021-03-14 ENCOUNTER — Telehealth: Payer: Self-pay

## 2021-03-14 DIAGNOSIS — Z0000 Encounter for general adult medical examination without abnormal findings: Secondary | ICD-10-CM

## 2021-03-14 NOTE — Progress Notes (Addendum)
Subjective:   Christine STODGHILL is a 65 y.o. female who presents for Medicare Annual (Subsequent) preventive examination.  Virtual Visit via Telephone Note  I connected with  [name] on 03/14/21 at  9:20 AM EDT by telephone and verified that I am speaking with the correct person using two identifiers.  Location: Patient: home Provider: [facility] Persons participating in the virtual visit: [place]   I discussed the limitations, risks, security and privacy concerns of performing an evaluation and management service by telephone and the availability of in person appointments. The patient expressed understanding and agreed to proceed.  Interactive audio and video telecommunications were attempted between this nurse and patient, however failed, due to patient having technical difficulties OR patient did not have access to video capability.  We continued and completed visit with audio only.  Some vital signs may be absent or patient reported.   Clemetine Marker, LPN   Review of Systems     Cardiac Risk Factors include: advanced age (>80men, >32 women);dyslipidemia     Objective:    There were no vitals filed for this visit. There is no height or weight on file to calculate BMI.  Advanced Directives 03/14/2021 03/10/2020 11/30/2019 03/27/2017 11/13/2016  Does Patient Have a Medical Advance Directive? Yes Yes No No Yes  Type of Paramedic of [place];Living will [place];Living will - - Living will  Copy of [place] in Chart? No - copy requested No - copy requested - - -  Would patient like information on creating a medical advance directive? - - No - Patient declined No - Patient declined -    Current Medications (verified) Outpatient Encounter Medications as of 03/14/2021  Medication Sig   Ascorbic Acid (VITAMIN C) 500 MG CAPS Take 1,000 mg by mouth.   aspirin EC 81 MG tablet Take 81 mg by mouth daily  as needed (learned that it is good to take everyday).   Cholecalciferol (VITAMIN D) 2000 units tablet Take 2,000 Units by mouth daily.   fluticasone (FLONASE) 50 MCG/ACT nasal spray USE 2 SPRAYS IN EACH NOSTRIL EVERY DAY   ibandronate (BONIVA) 150 MG tablet Take 1 tablet by mouth every 30 (thirty) days.   meclizine (ANTIVERT) 25 MG tablet TAKE 1 TABLET THREE TIMES DAILY AS NEEDED  FOR  DIZZINESS   methocarbamol (ROBAXIN) 500 MG tablet methocarbamol 500 mg tablet  TAKE ONE TABLET BY MOUTH AT BEDTIME AS NEEDED   Olopatadine HCl 0.2 % SOLN Place 1 drop into both eyes every evening.   simvastatin (ZOCOR) 10 MG tablet Take 10 mg by mouth at bedtime.   solifenacin (VESICARE) 10 MG tablet Take 10 mg by mouth daily.   timolol (TIMOPTIC) 0.5 % ophthalmic solution Place 1 drop into both eyes in the morning.   [DISCONTINUED] baclofen (LIORESAL) 10 MG tablet Take 1 tablet (10 mg total) by mouth at bedtime.   [DISCONTINUED] traMADol (ULTRAM) 50 MG tablet Take 1 tablet (50 mg total) by mouth every 6 (six) hours as needed.   No facility-administered encounter medications on file as of 03/14/2021.    Allergies (verified) Amoxicillin, Hydrocodone, Tizanidine hcl, and Oxycodone hcl   History: Past Medical History:  Diagnosis Date   Hyperlipidemia    Lateral epicondylitis of right elbow 01/04/2015   Past Surgical History:  Procedure Laterality Date   COLONOSCOPY WITH PROPOFOL N/A 11/13/2016   Procedure: COLONOSCOPY WITH PROPOFOL;  Surgeon: Lollie Sails, MD;  Location: [facility]  ENDOSCOPY;  Service: Endoscopy;  Laterality: N/A;   Family History  Problem Relation Age of Onset   Breast cancer Mother 45   Prostate cancer Father    Throat cancer Brother    Social History   Socioeconomic History   Marital status: Married    Spouse name: Not on file   Number of children: 0   Years of education: Not on file   Highest education level: High school graduate  Occupational History   Not on file  Tobacco  Use   Smoking status: Former    Pack years: 0.00    Types: Cigarettes    Quit date: 10/14/1975    Years since quitting: 45.4   Smokeless tobacco: Never  Vaping Use   Vaping Use: Never used  Substance and Sexual Activity   Alcohol use: No   Drug use: No   Sexual activity: Not on file  Other Topics Concern   Not on file  Social History Narrative   Not on file   Social Determinants of Health   Financial Resource Strain: Low Risk    Difficulty of Paying Living Expenses: Not hard at all  Food Insecurity: No Food Insecurity   Worried About Charity fundraiser in the Last Year: Never true   [place] in the Last Year: Never true  Transportation Needs: No Transportation Needs   Lack of Transportation (Medical): No   Lack of Transportation (Non-Medical): No  Physical Activity: Insufficiently Active   Days of Exercise per Week: 3 days   Minutes of Exercise per Session: 30 min  Stress: No Stress Concern Present   Feeling of Stress : Only a little  Social Connections: Moderately Integrated   Frequency of Communication with Friends and Family: More than three times a week   Frequency of Social Gatherings with Friends and Family: Once a week   Attends Religious Services: More than 4 times per year   Active Member of [employer] or Organizations: No   Attends Music therapist: Never   Marital Status: Married    Tobacco Counseling Counseling given: Not Answered   Clinical Intake:  Pre-visit preparation completed: Yes  Pain : No/denies pain     Nutritional Risks: None Diabetes: No  How often do you need to have someone help you when you read instructions, pamphlets, or other written materials from your doctor or pharmacy?: 1 - Never    Interpreter Needed?: No  Information entered by :: Clemetine Marker LPN   Activities of Daily Living In your present state of health, do you have any difficulty performing the following activities: 03/14/2021 02/16/2021  Hearing?  N N  Comment declines hearing aids -  Vision? N N  Difficulty concentrating or making decisions? N N  Walking or climbing stairs? Y Y  Dressing or bathing? N N  Doing errands, shopping? N N  Preparing Food and eating ? N -  Using the Toilet? N -  In the past six months, have you accidently leaked urine? N -  Do you have problems with loss of bowel control? N -  Managing your Medications? N -  Managing your Finances? N -  Housekeeping or managing your Housekeeping? N -  Some recent data might be hidden    Patient Care Team: Glean Hess, MD as PCP - General (Internal Medicine) Schermerhorn, Gwen Her, MD as Referring Physician (Obstetrics and Gynecology) Gabriel Carina Betsey Holiday, MD as Physician Assistant (Endocrinology)  Indicate any recent Medical Services you  may have received from other than [facility] providers in the past year (date may be approximate).     Assessment:   This is a routine wellness examination for Emmylou.  Hearing/Vision screen Hearing Screening - Comments:: Pt denies hearing difficulty Vision Screening - Comments:: Annual vision screenings with Dr. Glennon Mac at [facility] in [place]  Dietary issues and exercise activities discussed: Current Exercise Habits: Home exercise routine, Type of exercise: treadmill, Time (Minutes): 30, Frequency (Times/Week): 3, Weekly Exercise (Minutes/Week): 90, Intensity: Moderate, Exercise limited by: orthopedic condition(s)   Goals Addressed             This Visit's Progress    Increase physical activity       Increase physical activity to 150 minutes per week         Depression Screen PHQ 2/9 Scores 03/14/2021 02/16/2021 03/10/2020 01/29/2020 12/02/2019 06/20/2019 01/29/2019  PHQ - 2 Score 0 0 0 0 0 0 0  PHQ- 9 Score - 0 - 0 - - -    Fall Risk Fall Risk  03/14/2021 02/16/2021 03/10/2020 01/29/2020 01/29/2019  Falls in the past year? 0 0 0 0 0  Number falls in past yr: 0 0 0 0 0  Injury with Fall? 0 0 0 0 0  Risk for fall due to  : No Fall Risks - No Fall Risks No Fall Risks -  Follow up Falls prevention discussed Falls evaluation completed Falls prevention discussed Falls evaluation completed Falls evaluation completed    [place]:  Any stairs in or around the home? Yes  If so, are there any without handrails? No  Home free of loose throw rugs in walkways, pet beds, electrical cords, etc? Yes  Adequate lighting in your home to reduce risk of falls? Yes   ASSISTIVE DEVICES UTILIZED TO PREVENT FALLS:  Life alert? No  Use of a cane, walker or w/c? No  Grab bars in the bathroom? No  Shower chair or bench in shower? Yes  Elevated toilet seat or a handicapped toilet? No   TIMED UP AND GO:  Was the test performed? No . Telephonic visit.   Cognitive Function: Normal cognitive status assessed by direct observation by this Nurse Health Advisor. No abnormalities found.          Immunizations Immunization History  Administered Date(s) Administered   Fluad Quad(high Dose 65+) 06/13/2019   Influenza Inj Mdck Quad With Preservative 06/20/2018   Influenza,inj,Quad PF,6+ Mos 10/27/2016, 06/22/2020   Influenza-Unspecified 06/20/2018   Moderna Sars-Covid-2 Vaccination 10/05/2019, 11/02/2019, 07/20/2020   Pneumococcal Conjugate-13 01/29/2019   Tdap 06/11/2018   Zoster Recombinat (Shingrix) 05/15/2018, 07/19/2018    TDAP status: Up to date  Flu Vaccine status: Up to date  Pneumococcal vaccine status: Due, Education has been provided regarding the importance of this vaccine. Advised may receive this vaccine at local pharmacy or Health Dept. Aware to provide a copy of the vaccination record if obtained from local pharmacy or Health Dept. Verbalized acceptance and understanding.  Covid-19 vaccine status: Completed vaccines  Qualifies for Shingles Vaccine? Yes   Zostavax completed No   Shingrix Completed?: Yes  Screening Tests Health Maintenance  Topic Date Due   COVID-19  Vaccine (4 - Booster for Moderna series) 11/17/2020   Hepatitis C Screening  05/04/2021 (Originally 01/13/1974)   PNA vac Low Risk Adult (2 of 2 - PPSV23) 05/04/2021 (Originally 01/29/2020)   INFLUENZA VACCINE  04/18/2021   MAMMOGRAM  07/13/2021   COLONOSCOPY (Pts 45-4yrs  Insurance coverage will need to be confirmed)  11/13/2021   TETANUS/TDAP  06/11/2028   DEXA SCAN  Completed   Zoster Vaccines- Shingrix  Completed   HPV VACCINES  Aged Out    Health Maintenance  Health Maintenance Due  Topic Date Due   COVID-19 Vaccine (4 - Booster for Moderna series) 11/17/2020    Colorectal cancer screening: Type of screening: Colonoscopy. Completed 11/13/16. Repeat every 5 years  Mammogram status: Completed 07/13/20. Repeat every year  Bone Density status: Completed 06/28/20. Results reflect: Bone density results: OSTEOPENIA. Repeat every 2 years.  Lung Cancer Screening: (Low Dose CT Chest recommended if Age 54-80 years, 30 pack-year currently smoking OR have quit w/in 15years.) does not qualify.   Additional Screening:  Hepatitis C Screening: does qualify; postponed  Vision Screening: Recommended annual ophthalmology exams for early detection of glaucoma and other disorders of the eye. Is the patient up to date with their annual eye exam?  Yes  Who is the provider or what is the name of the office in which the patient attends annual eye exams? Dr. Glennon Mac; [place]   Dental Screening: Recommended annual dental exams for proper oral hygiene  Community Resource Referral / Chronic Care Management: CRR required this visit?  No   CCM required this visit?  No      Plan:     I have personally reviewed and noted the following in the patient's chart:   Medical and social history Use of alcohol, tobacco or illicit drugs  Current medications and supplements including opioid prescriptions.  Functional ability and status Nutritional status Physical activity Advanced  directives List of other physicians Hospitalizations, surgeries, and ER visits in previous 12 months Vitals Screenings to include cognitive, depression, and falls Referrals and appointments  In addition, I have reviewed and discussed with patient certain preventive protocols, quality metrics, and best practice recommendations. A written personalized care plan for preventive services as well as general preventive health recommendations were provided to patient.     Clemetine Marker, LPN   9/38/1829   Nurse Notes: pt states she went to ortho and received an injection in her knee which is feeling okay but still having issues with veins in her legs and wants to discuss possible referral to vascular provider. Pt advised to also contact ortho regarding reaction to tizanidine and she does not like how methocarbamol causes drowsiness.   Pt also called office to let us know she has not been taking Tizanidine because wen she was taking the medication it wakes her up at 3 and 4am / and she is not able to sleep throughout the night after taking it. Medication was added to allergy list.

## 2021-03-14 NOTE — Telephone Encounter (Signed)
Note addended

## 2021-03-14 NOTE — Telephone Encounter (Unsigned)
Copied from Margaret [id number]. Topic: General - Other >> Mar 14, 2021 10:23 AM Alanda Slim E wrote: Reason for CRM: Pt called to let [employer] [employer] know that the reason she has not been taking Tizanidine/ She takes the medication(Tizanidine) and it wakes her up at 3 and 4am / and she is not able to sleep throughout the night after taking this Med

## 2021-03-14 NOTE — Patient Instructions (Signed)
Christine Ray , Thank you for taking time to come for your Medicare Wellness Visit. I appreciate your ongoing commitment to your health goals. Please review the following plan we discussed and let me know if I can assist you in the future.   Screening recommendations/referrals: Colonoscopy: done 11/13/16. Repeat in 10/2021 Mammogram: done 07/13/20 Bone Density: done 06/28/20 Recommended yearly ophthalmology/optometry visit for glaucoma screening and checkup Recommended yearly dental visit for hygiene and checkup  Vaccinations: Influenza vaccine: done 06/22/20 Pneumococcal vaccine: Prevnar13 done 01/29/19. Due for Pneumovax23  Tdap vaccine: done 06/11/18 Shingles vaccine: done 05/15/18 & 07/19/18   Covid-19: done 10/05/19, 11/02/19 & 07/20/20  Advanced directives: Please bring a copy of your health care power of attorney and living will to the office at your convenience.   Conditions/risks identified: Recommend physical activity of 150 minutes per week   Next appointment: Follow up in one year for your annual wellness visit    Preventive Care 65 Years and Older, Female Preventive care refers to lifestyle choices and visits with your health care provider that can promote health and wellness. What does preventive care include? A yearly physical exam. This is also called an annual well check. Dental exams once or twice a year. Routine eye exams. Ask your health care provider how often you should have your eyes checked. Personal lifestyle choices, including: Daily care of your teeth and gums. Regular physical activity. Eating a healthy diet. Avoiding tobacco and drug use. Limiting alcohol use. Practicing safe sex. Taking low-dose aspirin every day. Taking vitamin and mineral supplements as recommended by your health care provider. What happens during an annual well check? The services and screenings done by your health care provider during your annual well check will depend on your age,  overall health, lifestyle risk factors, and family history of disease. Counseling  Your health care provider may ask you questions about your: Alcohol use. Tobacco use. Drug use. Emotional well-being. Home and relationship well-being. Sexual activity. Eating habits. History of falls. Memory and ability to understand (cognition). Work and work Statistician. Reproductive health. Screening  You may have the following tests or measurements: Height, weight, and BMI. Blood pressure. Lipid and cholesterol levels. These may be checked every 5 years, or more frequently if you are over 40 years old. Skin check. Lung cancer screening. You may have this screening every year starting at age 27 if you have a 30-pack-year history of smoking and currently smoke or have quit within the past 15 years. Fecal occult blood test (FOBT) of the stool. You may have this test every year starting at age 19. Flexible sigmoidoscopy or colonoscopy. You may have a sigmoidoscopy every 5 years or a colonoscopy every 10 years starting at age 77. Hepatitis C blood test. Hepatitis B blood test. Sexually transmitted disease (STD) testing. Diabetes screening. This is done by checking your blood sugar (glucose) after you have not eaten for a while (fasting). You may have this done every 1-3 years. Bone density scan. This is done to screen for osteoporosis. You may have this done starting at age 88. Mammogram. This may be done every 1-2 years. Talk to your health care provider about how often you should have regular mammograms. Talk with your health care provider about your test results, treatment options, and if necessary, the need for more tests. Vaccines  Your health care provider may recommend certain vaccines, such as: Influenza vaccine. This is recommended every year. Tetanus, diphtheria, and acellular pertussis (Tdap, Td) vaccine. You may need  a Td booster every 10 years. Zoster vaccine. You may need this after age  60. Pneumococcal 13-valent conjugate (PCV13) vaccine. One dose is recommended after age 72. Pneumococcal polysaccharide (PPSV23) vaccine. One dose is recommended after age 78. Talk to your health care provider about which screenings and vaccines you need and how often you need them. This information is not intended to replace advice given to you by your health care provider. Make sure you discuss any questions you have with your health care provider. Document Released: 10/01/2015 Document Revised: 05/24/2016 Document Reviewed: 07/06/2015 Elsevier Interactive Patient Education  2017 [place] Prevention in the Home Falls can cause injuries. They can happen to people of all ages. There are many things you can do to make your home safe and to help prevent falls. What can I do on the outside of my home? Regularly fix the edges of walkways and driveways and fix any cracks. Remove anything that might make you trip as you walk through a door, such as a raised step or threshold. Trim any bushes or trees on the path to your home. Use bright outdoor lighting. Clear any walking paths of anything that might make someone trip, such as rocks or tools. Regularly check to see if handrails are loose or broken. Make sure that both sides of any steps have handrails. Any raised decks and porches should have guardrails on the edges. Have any leaves, snow, or ice cleared regularly. Use sand or salt on walking paths during winter. Clean up any spills in your garage right away. This includes oil or grease spills. What can I do in the bathroom? Use night lights. Install grab bars by the toilet and in the tub and shower. Do not use towel bars as grab bars. Use non-skid mats or decals in the tub or shower. If you need to sit down in the shower, use a plastic, non-slip stool. Keep the floor dry. Clean up any water that spills on the floor as soon as it happens. Remove soap buildup in the tub or shower  regularly. Attach bath mats securely with double-sided non-slip rug tape. Do not have throw rugs and other things on the floor that can make you trip. What can I do in the bedroom? Use night lights. Make sure that you have a light by your bed that is easy to reach. Do not use any sheets or blankets that are too big for your bed. They should not hang down onto the floor. Have a firm chair that has side arms. You can use this for support while you get dressed. Do not have throw rugs and other things on the floor that can make you trip. What can I do in the kitchen? Clean up any spills right away. Avoid walking on wet floors. Keep items that you use a lot in easy-to-reach places. If you need to reach something above you, use a strong step stool that has a grab bar. Keep electrical cords out of the way. Do not use floor polish or wax that makes floors slippery. If you must use wax, use non-skid floor wax. Do not have throw rugs and other things on the floor that can make you trip. What can I do with my stairs? Do not leave any items on the stairs. Make sure that there are handrails on both sides of the stairs and use them. Fix handrails that are broken or loose. Make sure that handrails are as long as the stairways. Check  any carpeting to make sure that it is firmly attached to the stairs. Fix any carpet that is loose or worn. Avoid having throw rugs at the top or bottom of the stairs. If you do have throw rugs, attach them to the floor with carpet tape. Make sure that you have a light switch at the top of the stairs and the bottom of the stairs. If you do not have them, ask someone to add them for you. What else can I do to help prevent falls? Wear shoes that: Do not have high heels. Have rubber bottoms. Are comfortable and fit you well. Are closed at the toe. Do not wear sandals. If you use a stepladder: Make sure that it is fully opened. Do not climb a closed stepladder. Make sure that  both sides of the stepladder are locked into place. Ask someone to hold it for you, if possible. Clearly mark and make sure that you can see: Any grab bars or handrails. First and last steps. Where the edge of each step is. Use tools that help you move around (mobility aids) if they are needed. These include: Canes. Walkers. Scooters. Crutches. Turn on the lights when you go into a dark area. Replace any light bulbs as soon as they burn out. Set up your furniture so you have a clear path. Avoid moving your furniture around. If any of your floors are uneven, fix them. If there are any pets around you, be aware of where they are. Review your medicines with your doctor. Some medicines can make you feel dizzy. This can increase your chance of falling. Ask your doctor what other things that you can do to help prevent falls. This information is not intended to replace advice given to you by your health care provider. Make sure you discuss any questions you have with your health care provider. Document Released: 07/01/2009 Document Revised: 02/10/2016 Document Reviewed: 10/09/2014 Elsevier Interactive Patient Education  2017 [employer].

## 2021-04-02 ENCOUNTER — Other Ambulatory Visit: Payer: Self-pay | Admitting: Internal Medicine

## 2021-04-02 NOTE — Telephone Encounter (Signed)
Requested Prescriptions  Pending Prescriptions Disp Refills  . fluticasone (FLONASE) 50 MCG/ACT nasal spray [Pharmacy Med Name: FLUTICASONE PROPIONATE 50 MCG/ACT Suspension] 48 g 1    Sig: USE 2 SPRAYS IN EACH NOSTRIL EVERY DAY     Ear, Nose, and Throat: Nasal Preparations - Corticosteroids Passed - 04/02/2021  7:01 AM      Passed - Valid encounter within last 12 months    Recent Outpatient Visits          1 month ago Leg pain, posterior, right   [facility] Glean Hess, MD   11 months ago Tendonitis of shoulder, left   [facility] Glean Hess, MD   1 year ago Chronic pain of left knee   [facility] Glean Hess, MD   1 year ago [facility] Glean Hess, MD   1 year ago Patellar tendonitis of both knees   [facility] Glean Hess, MD

## 2021-04-20 DIAGNOSIS — H524 Presbyopia: Secondary | ICD-10-CM | POA: Diagnosis not present

## 2021-06-13 ENCOUNTER — Other Ambulatory Visit: Payer: Self-pay

## 2021-06-13 ENCOUNTER — Ambulatory Visit (INDEPENDENT_AMBULATORY_CARE_PROVIDER_SITE_OTHER): Payer: Medicare HMO

## 2021-06-13 DIAGNOSIS — Z23 Encounter for immunization: Secondary | ICD-10-CM | POA: Diagnosis not present

## 2021-06-23 ENCOUNTER — Other Ambulatory Visit: Payer: Self-pay | Admitting: Obstetrics and Gynecology

## 2021-06-23 DIAGNOSIS — R102 Pelvic and perineal pain: Secondary | ICD-10-CM | POA: Diagnosis not present

## 2021-06-23 DIAGNOSIS — Z01419 Encounter for gynecological examination (general) (routine) without abnormal findings: Secondary | ICD-10-CM | POA: Diagnosis not present

## 2021-06-23 DIAGNOSIS — Z1231 Encounter for screening mammogram for malignant neoplasm of breast: Secondary | ICD-10-CM

## 2021-06-23 DIAGNOSIS — M816 Localized osteoporosis [Lequesne]: Secondary | ICD-10-CM | POA: Diagnosis not present

## 2021-06-23 DIAGNOSIS — Z1331 Encounter for screening for depression: Secondary | ICD-10-CM | POA: Diagnosis not present

## 2021-06-23 DIAGNOSIS — R0789 Other chest pain: Secondary | ICD-10-CM | POA: Diagnosis not present

## 2021-06-23 DIAGNOSIS — Z124 Encounter for screening for malignant neoplasm of cervix: Secondary | ICD-10-CM | POA: Diagnosis not present

## 2021-06-23 DIAGNOSIS — Z1211 Encounter for screening for malignant neoplasm of colon: Secondary | ICD-10-CM | POA: Diagnosis not present

## 2021-07-08 DIAGNOSIS — Z1211 Encounter for screening for malignant neoplasm of colon: Secondary | ICD-10-CM | POA: Diagnosis not present

## 2021-07-14 ENCOUNTER — Other Ambulatory Visit: Payer: Self-pay

## 2021-07-14 ENCOUNTER — Ambulatory Visit
Admission: RE | Admit: 2021-07-14 | Discharge: 2021-07-14 | Disposition: A | Discharge status: Home / Self Care | Payer: Medicare HMO | Source: Ambulatory Visit | Attending: Obstetrics and Gynecology | Admitting: Obstetrics and Gynecology

## 2021-07-14 DIAGNOSIS — Z1231 Encounter for screening mammogram for malignant neoplasm of breast: Secondary | ICD-10-CM | POA: Insufficient documentation

## 2021-07-18 DIAGNOSIS — R102 Pelvic and perineal pain: Secondary | ICD-10-CM | POA: Diagnosis not present

## 2021-07-18 DIAGNOSIS — N898 Other specified noninflammatory disorders of vagina: Secondary | ICD-10-CM | POA: Diagnosis not present

## 2021-07-18 DIAGNOSIS — R300 Dysuria: Secondary | ICD-10-CM | POA: Diagnosis not present

## 2021-07-18 DIAGNOSIS — N952 Postmenopausal atrophic vaginitis: Secondary | ICD-10-CM | POA: Diagnosis not present

## 2021-07-22 DIAGNOSIS — D251 Intramural leiomyoma of uterus: Secondary | ICD-10-CM | POA: Diagnosis not present

## 2021-07-22 DIAGNOSIS — R102 Pelvic and perineal pain: Secondary | ICD-10-CM | POA: Diagnosis not present

## 2021-10-11 ENCOUNTER — Encounter: Payer: Self-pay | Admitting: Internal Medicine

## 2021-10-11 ENCOUNTER — Ambulatory Visit (INDEPENDENT_AMBULATORY_CARE_PROVIDER_SITE_OTHER): Payer: Medicare HMO | Admitting: Internal Medicine

## 2021-10-11 ENCOUNTER — Other Ambulatory Visit: Payer: Self-pay

## 2021-10-11 VITALS — BP 124/76 | HR 80 | Ht 71.0 in | Wt 209.4 lb

## 2021-10-11 DIAGNOSIS — D126 Benign neoplasm of colon, unspecified: Secondary | ICD-10-CM | POA: Insufficient documentation

## 2021-10-11 DIAGNOSIS — Z872 Personal history of diseases of the skin and subcutaneous tissue: Secondary | ICD-10-CM

## 2021-10-11 DIAGNOSIS — M62830 Muscle spasm of back: Secondary | ICD-10-CM | POA: Diagnosis not present

## 2021-10-11 MED ORDER — BACLOFEN 10 MG PO TABS: 10.0000 mg | ORAL_TABLET | Freq: Two times a day (BID) | ORAL | 0 refills | Status: DC

## 2021-10-11 MED ORDER — MELOXICAM 15 MG PO TABS: 15.0000 mg | ORAL_TABLET | Freq: Every day | ORAL | 0 refills | Status: DC

## 2021-10-11 NOTE — Progress Notes (Signed)
Date:  10/11/2021   Name:  Christine Ray   DOB:  [birth year]   MRN:  [id number]   Chief Complaint: Recurrent Skin Infections (Left butt cheek- burning pain and feels pressure like a boil is present. Sitting causes a lot of pain. ) and Edema (Left Foot- soreness. On and off. )  Back Pain This is a recurrent problem. The problem occurs every several days. The problem is unchanged. The pain is present in the lumbar spine. The quality of the pain is described as aching. The pain does not radiate. The pain is mild. Pertinent negatives include no chest pain, fever or headaches. She has tried muscle relaxant and NSAIDs for the symptoms.  Hx of cyst lanced about 4 yrs ago on left buttock.  Now the area feels uncomfortable when sitting for a long time.  No bleeding or drainage noted.  Lab Results  Component Value Date   NA 140 11/30/2019   K 3.8 11/30/2019   CO2 27 11/30/2019   GLUCOSE 91 11/30/2019   BUN 19 11/30/2019   CREATININE 0.88 11/30/2019   CALCIUM 8.6 (L) 11/30/2019   GFRNONAA >60 11/30/2019   Lab Results  Component Value Date   CHOL 190 06/18/2019   HDL 52 06/18/2019   LDLCALC 120 06/18/2019   TRIG 106 06/17/2018   CHOLHDL 4.1 10/27/2016   Lab Results  Component Value Date   TSH 0.566 12/02/2019   No results found for: HGBA1C Lab Results  Component Value Date   WBC 9.3 11/30/2019   HGB 13.7 11/30/2019   HCT 44.1 11/30/2019   MCV 86.8 11/30/2019   PLT 191 11/30/2019   Lab Results  Component Value Date   ALT 15 06/18/2019   AST 18 06/18/2019   ALKPHOS 84 03/27/2017   BILITOT 0.9 03/27/2017   Lab Results  Component Value Date   VD25OH 31.0 06/17/2018     Review of Systems  Constitutional:  Negative for chills, fatigue and fever.  Respiratory:  Negative for chest tightness and shortness of breath.   Cardiovascular:  Positive for leg swelling. Negative for chest pain.  Musculoskeletal:  Positive for back pain.  Neurological:  Negative for dizziness and  headaches.   Patient Active Problem List   Diagnosis Date Noted   Chronic pain of left knee 04/06/2017   Hyperlipidemia 04/26/2016   Vitamin D deficiency 04/26/2016   BPPV (benign paroxysmal positional vertigo) 04/26/2016   Varicose veins of both lower extremities 04/26/2016   GERD (gastroesophageal reflux disease) 01/04/2015   Osteoporosis 01/04/2015    Allergies  Allergen Reactions   Amoxicillin Hives   Hydrocodone Other (See Comments)    headache   Tizanidine Hcl Other (See Comments)    Itching, headache   Oxycodone Hcl Rash    Past Surgical History:  Procedure Laterality Date   COLONOSCOPY WITH PROPOFOL N/A 11/13/2016   Procedure: COLONOSCOPY WITH PROPOFOL;  Surgeon: Lollie Sails, MD;  Location: [facility] ENDOSCOPY;  Service: Endoscopy;  Laterality: N/A;    Social History   Tobacco Use   Smoking status: Former    Types: Cigarettes    Quit date: 10/14/1975    Years since quitting: 46.0   Smokeless tobacco: Never  Vaping Use   Vaping Use: Never used  Substance Use Topics   Alcohol use: No   Drug use: No     Medication list has been reviewed and updated.  Current Meds  Medication Sig   Ascorbic Acid (VITAMIN C) 500 MG CAPS Take  1,000 mg by mouth.   aspirin EC 81 MG tablet Take 81 mg by mouth daily as needed (learned that it is good to take everyday).   Cholecalciferol (VITAMIN D) 2000 units tablet Take 2,000 Units by mouth daily.   fluticasone (FLONASE) 50 MCG/ACT nasal spray USE 2 SPRAYS IN EACH NOSTRIL EVERY DAY   ibandronate (BONIVA) 150 MG tablet Take 1 tablet by mouth every 30 (thirty) days.   meclizine (ANTIVERT) 25 MG tablet TAKE 1 TABLET THREE TIMES DAILY AS NEEDED  FOR  DIZZINESS   methocarbamol (ROBAXIN) 500 MG tablet methocarbamol 500 mg tablet  TAKE ONE TABLET BY MOUTH AT BEDTIME AS NEEDED   Olopatadine HCl 0.2 % SOLN Place 1 drop into both eyes every evening.   simvastatin (ZOCOR) 10 MG tablet Take 10 mg by mouth at bedtime.   solifenacin  (VESICARE) 10 MG tablet Take 10 mg by mouth daily.   timolol (TIMOPTIC) 0.5 % ophthalmic solution Place 1 drop into both eyes in the morning.    PHQ 2/9 Scores 10/11/2021 03/14/2021 02/16/2021 03/10/2020  PHQ - 2 Score 0 0 0 0  PHQ- 9 Score 0 - 0 -    GAD 7 : Generalized Anxiety Score 10/11/2021 02/16/2021  Nervous, Anxious, on Edge 0 0  Control/stop worrying 0 0  Worry too much - different things 0 0  Trouble relaxing 0 0  Restless 0 0  Easily annoyed or irritable 0 0  Afraid - awful might happen 0 0  Total GAD 7 Score 0 0  Anxiety Difficulty Not difficult at all Not difficult at all    BP Readings from Last 3 Encounters:  10/11/21 124/76  02/16/21 128/74  05/04/20 122/88    Physical Exam Vitals and nursing note reviewed.  Constitutional:      General: She is not in acute distress.    Appearance: Normal appearance. She is well-developed.  HENT:     Head: Normocephalic and atraumatic.  Cardiovascular:     Rate and Rhythm: Normal rate and regular rhythm.  Pulmonary:     Effort: Pulmonary effort is normal. No respiratory distress.  Musculoskeletal:     Cervical back: Normal range of motion.     Lumbar back: Tenderness present. Negative right straight leg raise test and negative left straight leg raise test.       Back:  Skin:    General: Skin is warm and dry.     Findings: No rash.          Comments: Healed scar - no mass, induration, redness, warmth.  Minimally tender  Neurological:     Mental Status: She is alert and oriented to person, place, and time.  Psychiatric:        Mood and Affect: Mood normal.        Behavior: Behavior normal.    Wt Readings from Last 3 Encounters:  10/11/21 209 lb 6.4 oz (95 kg)  02/16/21 204 lb 9.6 oz (92.8 kg)  05/04/20 206 lb (93.4 kg)    BP 124/76    Pulse 80    Ht 5\' 11"  (1.803 m)    Wt 209 lb 6.4 oz (95 kg)    SpO2 96%    BMI 29.21 kg/m   Assessment and Plan: 1. Muscle spasm of back Recommend heat or ice as needed. Resume  prn baclofen and mobic - baclofen (LIORESAL) 10 MG tablet; Take 1 tablet (10 mg total) by mouth 2 (two) times daily.  Dispense: 180 each; Refill: 0 -  meloxicam (MOBIC) 15 MG tablet; Take 1 tablet (15 mg total) by mouth daily.  Dispense: 90 tablet; Refill: 0  2. H/O sebaceous cyst Area healed with no explanation for discomfort Patient is reassured  3. Tubular adenoma of colon Due for 5 yr colonoscopy next month Pt requests [facility] - Ambulatory referral to Gastroenterology   Partially dictated using Dragon software. Any errors are unintentional.  Halina Maidens, MD [facility]  10/11/2021

## 2021-10-12 ENCOUNTER — Other Ambulatory Visit: Payer: Self-pay

## 2021-10-12 DIAGNOSIS — Z860100 Personal history of colon polyps, unspecified: Secondary | ICD-10-CM

## 2021-10-12 MED ORDER — NA SULFATE-K SULFATE-MG SULF 17.5-3.13-1.6 GM/177ML PO SOLN: 1.0000 | Freq: Once | ORAL | 0 refills | Status: AC

## 2021-10-12 NOTE — Progress Notes (Signed)
Gastroenterology Pre-Procedure Review  Request Date: 11/08/2021 Requesting Physician: Dr. Marius Ditch  PATIENT REVIEW QUESTIONS: The patient responded to the following health history questions as indicated:    1. Are you having any GI issues? no 2. Do you have a personal history of Polyps? yes (last colonoscopy) 3. Do you have a family history of Colon Cancer or Polyps? yes (brother polyps) 4. Diabetes Mellitus? no 5. Joint replacements in the past 12 months?no 6. Major health problems in the past 3 months?no 7. Any artificial heart valves, MVP, or defibrillator?no    MEDICATIONS & ALLERGIES:    Patient reports the following regarding taking any anticoagulation/antiplatelet therapy:   Plavix, Coumadin, Eliquis, Xarelto, Lovenox, Pradaxa, Brilinta, or Effient? no Aspirin? yes (81mg )  Patient confirms/reports the following medications:  Current Outpatient Medications  Medication Sig Dispense Refill   Ascorbic Acid (VITAMIN C) 500 MG CAPS Take 1,000 mg by mouth.     aspirin EC 81 MG tablet Take 81 mg by mouth daily as needed (learned that it is good to take everyday).     baclofen (LIORESAL) 10 MG tablet Take 1 tablet (10 mg total) by mouth 2 (two) times daily. 180 each 0   Cholecalciferol (VITAMIN D) 2000 units tablet Take 2,000 Units by mouth daily.     fluticasone (FLONASE) 50 MCG/ACT nasal spray USE 2 SPRAYS IN EACH NOSTRIL EVERY DAY 48 g 1   ibandronate (BONIVA) 150 MG tablet Take 1 tablet by mouth every 30 (thirty) days.     meclizine (ANTIVERT) 25 MG tablet TAKE 1 TABLET THREE TIMES DAILY AS NEEDED  FOR  DIZZINESS 30 tablet 0   meloxicam (MOBIC) 15 MG tablet Take 1 tablet (15 mg total) by mouth daily. 90 tablet 0   methocarbamol (ROBAXIN) 500 MG tablet methocarbamol 500 mg tablet  TAKE ONE TABLET BY MOUTH AT BEDTIME AS NEEDED     Olopatadine HCl 0.2 % SOLN Place 1 drop into both eyes every evening.     simvastatin (ZOCOR) 10 MG tablet Take 10 mg by mouth at bedtime.  11   solifenacin  (VESICARE) 10 MG tablet Take 10 mg by mouth daily.     timolol (TIMOPTIC) 0.5 % ophthalmic solution Place 1 drop into both eyes in the morning.     No current facility-administered medications for this visit.    Patient confirms/reports the following allergies:  Allergies  Allergen Reactions   Amoxicillin Hives   Hydrocodone Other (See Comments)    headache   Tizanidine Hcl Other (See Comments)    Itching, headache   Oxycodone Hcl Rash    No orders of the defined types were placed in this encounter.   AUTHORIZATION INFORMATION Primary Insurance: 1D#: Group #:  Secondary Insurance: 1D#: Group #:  SCHEDULE INFORMATION: Date: 11/08/2021 Time: Location:[facility]

## 2021-10-13 ENCOUNTER — Other Ambulatory Visit: Payer: Self-pay | Admitting: Internal Medicine

## 2021-10-13 DIAGNOSIS — R42 Dizziness and giddiness: Secondary | ICD-10-CM

## 2021-10-13 NOTE — Telephone Encounter (Signed)
Requested medications are due for refill today.  yes  Requested medications are on the active medications list.  yes  Last refill. 02/17/2021  Future visit scheduled.   yes  Notes to clinic.  Medication not delegated.    Requested Prescriptions  Pending Prescriptions Disp Refills   meclizine (ANTIVERT) 25 MG tablet [Pharmacy Med Name: MECLIZINE HYDROCHLORIDE 25 MG Tablet] 30 tablet 0    Sig: TAKE 1 TABLET THREE TIMES DAILY AS NEEDED  FOR  DIZZINESS     Not Delegated - Gastroenterology: Antiemetics Failed - 10/13/2021  4:40 PM      Failed - This refill cannot be delegated      Passed - Valid encounter within last 6 months    Recent Outpatient Visits           2 days ago Muscle spasm of back   [facility] Glean Hess, MD   7 months ago Leg pain, posterior, right   [facility] Glean Hess, MD   1 year ago Tendonitis of shoulder, left   [facility] Glean Hess, MD   1 year ago Chronic pain of left knee   [facility] Glean Hess, MD   1 year ago [facility] Glean Hess, MD       Future Appointments             In 1 week Army Melia Jesse Sans, MD [facility], [facility]

## 2021-10-21 ENCOUNTER — Ambulatory Visit: Payer: Medicare HMO | Admitting: Internal Medicine

## 2021-11-02 DIAGNOSIS — H401132 Primary open-angle glaucoma, bilateral, moderate stage: Secondary | ICD-10-CM | POA: Diagnosis not present

## 2021-11-04 ENCOUNTER — Other Ambulatory Visit: Payer: Self-pay | Admitting: Internal Medicine

## 2021-11-04 ENCOUNTER — Telehealth: Payer: Self-pay

## 2021-11-04 MED ORDER — GOLYTELY 236 G PO SOLR: 4000.0000 mL | Freq: Once | ORAL | 0 refills | Status: AC

## 2021-11-04 MED ORDER — CLENPIQ 10-3.5-12 MG-GM -GM/160ML PO SOLN: 320.0000 mL | Freq: Once | ORAL | 0 refills | Status: AC

## 2021-11-04 NOTE — Telephone Encounter (Signed)
Patient left a message saying the prep that was sent to the pharmacy is not covered by her insurance she will need a different prep sent to the pharmacy.  Informed patient and patient asked if we could send clenpiq also to see if they would cover it

## 2021-11-04 NOTE — Telephone Encounter (Signed)
Requested medication (s) are due for refill today: yes  Requested medication (s) are on the active medication list: yes  Last refill:  04/02/21 with 1 refill  Future visit scheduled: yes  Notes to clinic:  Please review for refill. Refill not delegated per protocol.     Requested Prescriptions  Pending Prescriptions Disp Refills   fluticasone (FLONASE) 50 MCG/ACT nasal spray [Pharmacy Med Name: FLUTICASONE PROPIONATE 50 MCG/ACT Suspension] 48 g 1    Sig: USE 2 SPRAYS IN EACH NOSTRIL EVERY DAY     Not Delegated - Ear, Nose, and Throat: Nasal Preparations - Corticosteroids Failed - 11/04/2021  9:32 AM      Failed - This refill cannot be delegated      Passed - Valid encounter within last 12 months    Recent Outpatient Visits           3 weeks ago Muscle spasm of back   [facility] Glean Hess, MD   8 months ago Leg pain, posterior, right   [facility] Glean Hess, MD   1 year ago Tendonitis of shoulder, left   [facility] Glean Hess, MD   1 year ago Chronic pain of left knee   [facility] Glean Hess, MD   1 year ago [facility] Glean Hess, MD       Future Appointments             In 2 months Army Melia Jesse Sans, MD [facility], [facility]

## 2021-11-08 ENCOUNTER — Ambulatory Visit
Admission: RE | Admit: 2021-11-08 | Discharge: 2021-11-08 | Disposition: A | Discharge status: Home / Self Care | Payer: Medicare HMO | Attending: Gastroenterology | Admitting: Gastroenterology

## 2021-11-08 ENCOUNTER — Ambulatory Visit: Payer: Medicare HMO | Admitting: Certified Registered Nurse Anesthetist

## 2021-11-08 ENCOUNTER — Encounter: Payer: Self-pay | Admitting: Gastroenterology

## 2021-11-08 ENCOUNTER — Encounter
Admission: RE | Disposition: A | Discharge status: Home / Self Care | Payer: Self-pay | Source: Home / Self Care | Attending: Gastroenterology

## 2021-11-08 DIAGNOSIS — K219 Gastro-esophageal reflux disease without esophagitis: Secondary | ICD-10-CM | POA: Diagnosis not present

## 2021-11-08 DIAGNOSIS — Z860100 Personal history of colon polyps, unspecified: Secondary | ICD-10-CM

## 2021-11-08 DIAGNOSIS — D127 Benign neoplasm of rectosigmoid junction: Secondary | ICD-10-CM | POA: Insufficient documentation

## 2021-11-08 DIAGNOSIS — E785 Hyperlipidemia, unspecified: Secondary | ICD-10-CM | POA: Diagnosis not present

## 2021-11-08 DIAGNOSIS — Z87891 Personal history of nicotine dependence: Secondary | ICD-10-CM | POA: Insufficient documentation

## 2021-11-08 DIAGNOSIS — K635 Polyp of colon: Secondary | ICD-10-CM

## 2021-11-08 DIAGNOSIS — Z1211 Encounter for screening for malignant neoplasm of colon: Secondary | ICD-10-CM | POA: Diagnosis not present

## 2021-11-08 HISTORY — PX: COLONOSCOPY WITH PROPOFOL: SHX5780

## 2021-11-08 SURGERY — COLONOSCOPY WITH PROPOFOL
Anesthesia: General

## 2021-11-08 MED ORDER — LIDOCAINE HCL (CARDIAC) PF 100 MG/5ML IV SOSY
PREFILLED_SYRINGE | INTRAVENOUS | Status: DC | PRN
  Administered 2021-11-08: 50 mg via INTRAVENOUS

## 2021-11-08 MED ORDER — PROPOFOL 500 MG/50ML IV EMUL
INTRAVENOUS | Status: AC
  Filled 2021-11-08: qty 50

## 2021-11-08 MED ORDER — SODIUM CHLORIDE 0.9 % IV SOLN: INTRAVENOUS | Status: DC

## 2021-11-08 MED ORDER — PROPOFOL 10 MG/ML IV BOLUS
INTRAVENOUS | Status: DC | PRN
  Administered 2021-11-08: 60 mg via INTRAVENOUS

## 2021-11-08 MED ORDER — LIDOCAINE HCL (PF) 2 % IJ SOLN
INTRAMUSCULAR | Status: AC
  Filled 2021-11-08: qty 5

## 2021-11-08 MED ORDER — PROPOFOL 500 MG/50ML IV EMUL
INTRAVENOUS | Status: DC | PRN
  Administered 2021-11-08: 150 ug/kg/min via INTRAVENOUS

## 2021-11-08 NOTE — Anesthesia Procedure Notes (Signed)
Date/Time: 11/08/2021 9:20 AM Performed by: Tollie Eth, CRNA Pre-anesthesia Checklist: Patient identified, Emergency Drugs available, Suction available, Patient being monitored and Timeout performed Patient Re-evaluated:Patient Re-evaluated prior to induction Oxygen Delivery Method: Nasal cannula Preoxygenation: Pre-oxygenation with 100% oxygen Induction Type: IV induction

## 2021-11-08 NOTE — Anesthesia Postprocedure Evaluation (Signed)
Anesthesia Post Note  Patient: Christine Ray  Procedure(s) Performed: COLONOSCOPY WITH PROPOFOL  Patient location during evaluation: Endoscopy Anesthesia Type: General Level of consciousness: awake and alert Pain management: pain level controlled Vital Signs Assessment: post-procedure vital signs reviewed and stable Respiratory status: spontaneous breathing, nonlabored ventilation, respiratory function stable and patient connected to nasal cannula oxygen Cardiovascular status: blood pressure returned to baseline and stable Postop Assessment: no apparent nausea or vomiting Anesthetic complications: no   No notable events documented.   Last Vitals:  Vitals:   11/08/21 0951 11/08/21 1001  BP: 129/64 136/68  Pulse:    Resp:    Temp:    SpO2:      Last Pain:  Vitals:   11/08/21 1001  TempSrc:   PainSc: 0-No pain                 Arita Miss

## 2021-11-08 NOTE — Op Note (Signed)
[facility] Gastroenterology Patient Name: Christine Ray Procedure Date: 11/08/2021 9:09 AM MRN: [id number] Account #: [id number] Date of Birth: 07/21/1956 Admit Type: Outpatient Age: 66 Room: [facility] ENDO ROOM 3 Gender: Female Note Status: Finalized Instrument Name: Jasper Riling [id number] Procedure:             Colonoscopy Indications:           Surveillance: Personal history of adenomatous polyps                         on last colonoscopy 5 years ago, Last colonoscopy:                         February 2018 Providers:             Lin Landsman MD, MD Referring MD:          Halina Maidens, MD (Referring MD) Medicines:             General Anesthesia Complications:         No immediate complications. Estimated blood loss: None. Procedure:             Pre-Anesthesia Assessment:                        - Prior to the procedure, a History and Physical was                         performed, and patient medications and allergies were                         reviewed. The patient is competent. The risks and                         benefits of the procedure and the sedation options and                         risks were discussed with the patient. All questions                         were answered and informed consent was obtained.                         Patient identification and proposed procedure were                         verified by the physician, the nurse, the                         anesthesiologist, the anesthetist and the technician                         in the pre-procedure area in the procedure room in the                         endoscopy suite. Mental Status Examination: alert and                         oriented. Airway Examination: normal oropharyngeal  airway and neck mobility. Respiratory Examination:                         clear to auscultation. CV Examination: normal.                         Prophylactic Antibiotics: The  patient does not require                         prophylactic antibiotics. Prior Anticoagulants: The                         patient has taken no previous anticoagulant or                         antiplatelet agents. ASA Grade Assessment: II - A                         patient with mild systemic disease. After reviewing                         the risks and benefits, the patient was deemed in                         satisfactory condition to undergo the procedure. The                         anesthesia plan was to use general anesthesia.                         Immediately prior to administration of medications,                         the patient was re-assessed for adequacy to receive                         sedatives. The heart rate, respiratory rate, oxygen                         saturations, blood pressure, adequacy of pulmonary                         ventilation, and response to care were monitored                         throughout the procedure. The physical status of the                         patient was re-assessed after the procedure.                        After obtaining informed consent, the colonoscope was                         passed under direct vision. Throughout the procedure,                         the patient's blood pressure, pulse, and oxygen  saturations were monitored continuously. The                         Colonoscope was introduced through the anus and                         advanced to the the cecum, identified by appendiceal                         orifice and ileocecal valve. The colonoscopy was                         performed without difficulty. The patient tolerated                         the procedure well. The quality of the bowel                         preparation was evaluated using the BBPS [facility] Bowel                         Preparation Scale) with scores of: Right Colon = 3,                         Transverse  Colon = 3 and Left Colon = 3 (entire mucosa                         seen well with no residual staining, small fragments                         of stool or opaque liquid). The total BBPS score                         equals 9. Findings:      The perianal and digital rectal examinations were normal. Pertinent       negatives include normal sphincter tone and no palpable rectal lesions.      Two sessile polyps were found in the recto-sigmoid colon. The polyps       were diminutive in size. These polyps were removed with a cold snare.       Resection was complete, but the polyp tissue was not retrieved.       Estimated blood loss: none.      The retroflexed view of the distal rectum and anal verge was normal and       showed no anal or rectal abnormalities.      The terminal ileum appeared normal. Impression:            - Two diminutive polyps at the recto-sigmoid colon,                         removed with a cold snare. Complete resection. Polyp                         tissue not retrieved.                        - The distal rectum and anal verge are normal on  retroflexion view. Recommendation:        - Discharge patient to home (with escort).                        - Resume previous diet today.                        - Continue present medications.                        - Repeat colonoscopy in 10 years for screening                         purposes. Procedure Code(s):     --- Professional ---                        [phone], Colonoscopy, flexible; with removal of                         tumor(s), polyp(s), or other lesion(s) by snare                         technique Diagnosis Code(s):     --- Professional ---                        Z86.010, Personal history of colonic polyps                        K63.5, Polyp of colon CPT copyright 2019 American Medical Association. All rights reserved. The codes documented in this report are preliminary and upon coder review  may  be revised to meet current compliance requirements. Dr. Ulyess Mort Lin Landsman MD, MD 11/08/2021 9:44:02 AM This report has been signed electronically. Number of Addenda: 0 Note Initiated On: 11/08/2021 9:09 AM Scope Withdrawal Time: 0 hours 9 minutes 49 seconds  Total Procedure Duration: 0 hours 14 minutes 5 seconds  Estimated Blood Loss:  Estimated blood loss: none.      [facility]

## 2021-11-08 NOTE — Transfer of Care (Signed)
Immediate Anesthesia Transfer of Care Note  Patient: Christine Ray  Procedure(s) Performed: COLONOSCOPY WITH PROPOFOL  Patient Location: Endoscopy Unit  Anesthesia Type:General  Level of Consciousness: drowsy  Airway & Oxygen Therapy: Patient Spontanous Breathing  Post-op Assessment: Report given to RN and Post -op Vital signs reviewed and stable  Post vital signs: Reviewed and stable  Last Vitals:  Vitals Value Taken Time  BP 117/69 11/08/21 0941  Temp    Pulse 73 11/08/21 0941  Resp 30 11/08/21 0941  SpO2 97 % 11/08/21 0941  Vitals shown include unvalidated device data.  Last Pain:  Vitals:   11/08/21 0838  TempSrc: Temporal  PainSc: 0-No pain         Complications: No notable events documented.

## 2021-11-08 NOTE — Anesthesia Preprocedure Evaluation (Signed)
Anesthesia Evaluation  Patient identified by MRN, date of birth, ID band Patient awake    Reviewed: Allergy & Precautions, NPO status , Patient's Chart, lab work & pertinent test results  History of Anesthesia Complications Negative for: history of anesthetic complications  Airway Mallampati: II  TM Distance: >3 FB Neck ROM: Full    Dental  (+) Teeth Intact, Partial Lower   Pulmonary neg pulmonary ROS, neg sleep apnea, neg COPD, Patient abstained from smoking.Not current smoker, former smoker,    Pulmonary exam normal breath sounds clear to auscultation       Cardiovascular Exercise Tolerance: Good METS(-) hypertension(-) CAD and (-) Past MI negative cardio ROS  (-) dysrhythmias  Rhythm:Regular Rate:Normal - Systolic murmurs    Neuro/Psych negative neurological ROS  negative psych ROS   GI/Hepatic GERD  ,(+)     (-) substance abuse  ,   Endo/Other  neg diabetes  Renal/GU negative Renal ROS     Musculoskeletal   Abdominal   Peds  Hematology   Anesthesia Other Findings Past Medical History: No date: Hyperlipidemia No date: Hyperlipidemia 01/04/2015: Lateral epicondylitis of right elbow  Reproductive/Obstetrics                             Anesthesia Physical Anesthesia Plan  ASA: 2  Anesthesia Plan: General   Post-op Pain Management: Minimal or no pain anticipated   Induction: Intravenous  PONV Risk Score and Plan: 3 and Propofol infusion, TIVA and Ondansetron  Airway Management Planned: Nasal Cannula  Additional Equipment: None  Intra-op Plan:   Post-operative Plan:   Informed Consent: I have reviewed the patients History and Physical, chart, labs and discussed the procedure including the risks, benefits and alternatives for the proposed anesthesia with the patient or authorized representative who has indicated his/her understanding and acceptance.     Dental advisory  given  Plan Discussed with: CRNA and Surgeon  Anesthesia Plan Comments: (Discussed risks of anesthesia with patient, including possibility of difficulty with spontaneous ventilation under anesthesia necessitating airway intervention, PONV, and rare risks such as cardiac or respiratory or neurological events, and allergic reactions. Discussed the role of CRNA in patient's perioperative care. Patient understands.)        Anesthesia Quick Evaluation

## 2021-11-08 NOTE — H&P (Signed)
Cephas Darby, MD [address]  [place]  [place], [place] [address]  Main: [phone]  Fax: [phone] Pager: [phone]  Primary Care Physician:  Glean Hess, MD Primary Gastroenterologist:  Dr. Cephas Darby  Pre-Procedure History & Physical: HPI:  Christine Ray is a 66 y.o. female is here for an colonoscopy.   Past Medical History:  Diagnosis Date   Hyperlipidemia    Hyperlipidemia    Lateral epicondylitis of right elbow 01/04/2015    Past Surgical History:  Procedure Laterality Date   COLONOSCOPY WITH PROPOFOL N/A 11/13/2016   Procedure: COLONOSCOPY WITH PROPOFOL;  Surgeon: Lollie Sails, MD;  Location: [facility] ENDOSCOPY;  Service: Endoscopy;  Laterality: N/A;    Prior to Admission medications   Medication Sig Start Date End Date Taking? Authorizing Provider  baclofen (LIORESAL) 10 MG tablet Take 1 tablet (10 mg total) by mouth 2 (two) times daily. 10/11/21  Yes Glean Hess, MD  meclizine (ANTIVERT) 25 MG tablet TAKE 1 TABLET THREE TIMES DAILY AS NEEDED FOR DIZZINESS 10/14/21  Yes Glean Hess, MD  meloxicam (MOBIC) 15 MG tablet Take 1 tablet (15 mg total) by mouth daily. 10/11/21  Yes Glean Hess, MD  Olopatadine HCl 0.2 % SOLN Place 1 drop into both eyes every evening. 02/18/20  Yes [provider]  Ascorbic Acid (VITAMIN C) 500 MG CAPS Take 1,000 mg by mouth.    [provider]  aspirin EC 81 MG tablet Take 81 mg by mouth daily as needed (learned that it is good to take everyday).    [provider]  Cholecalciferol (VITAMIN D) 2000 units tablet Take 2,000 Units by mouth daily.    [provider]  fluticasone (FLONASE) 50 MCG/ACT nasal spray USE 2 SPRAYS IN [facility] NOSTRIL EVERY DAY 11/04/21   Glean Hess, MD  ibandronate (BONIVA) 150 MG tablet Take 1 tablet by mouth every 30 (thirty) days. 12/18/18   [provider]  methocarbamol (ROBAXIN) 500 MG tablet methocarbamol 500 mg tablet  TAKE  ONE TABLET BY MOUTH AT BEDTIME AS NEEDED Patient not taking: Reported on 11/08/2021    [provider]  simvastatin (ZOCOR) 10 MG tablet Take 10 mg by mouth at bedtime. 09/27/17   [provider]  solifenacin (VESICARE) 10 MG tablet Take 10 mg by mouth daily.    [provider]  timolol (TIMOPTIC) 0.5 % ophthalmic solution Place 1 drop into both eyes in the morning. 01/23/20   [provider]    Allergies as of 10/12/2021 - Review Complete 10/12/2021  Allergen Reaction Noted   Amoxicillin Hives 06/11/2018   Hydrocodone Other (See Comments) 10/14/2015   Tizanidine hcl Other (See Comments) 03/14/2021   Oxycodone hcl Rash 01/04/2015    Family History  Problem Relation Age of Onset   Breast cancer Mother 64   Prostate cancer Father    Throat cancer Brother     Social History   Socioeconomic History   Marital status: Married    Spouse name: Not on file   Number of children: 0   Years of education: Not on file   Highest education level: High school graduate  Occupational History   Not on file  Tobacco Use   Smoking status: Former    Types: Cigarettes    Quit date: 10/14/1975    Years since quitting: 46.1   Smokeless tobacco: Never  Vaping Use   Vaping Use: Never used  Substance and Sexual Activity  Alcohol use: No   Drug use: No   Sexual activity: Not on file  Other Topics Concern   Not on file  Social History Narrative   Not on file   Social Determinants of Health   Financial Resource Strain: Low Risk    Difficulty of Paying Living Expenses: Not hard at all  Food Insecurity: No Food Insecurity   Worried About Running Out of Food in the Last Year: Never true   [place] in the Last Year: Never true  Transportation Needs: No Transportation Needs   Lack of Transportation (Medical): No   Lack of Transportation (Non-Medical): No  Physical Activity: Insufficiently Active   Days of Exercise per Week: 3 days   Minutes of Exercise  per Session: 30 min  Stress: No Stress Concern Present   Feeling of Stress : Only a little  Social Connections: Moderately Integrated   Frequency of Communication with Friends and Family: More than three times a week   Frequency of Social Gatherings with Friends and Family: Once a week   Attends Religious Services: More than 4 times per year   Active Member of [employer] or Organizations: No   Attends Music therapist: Never   Marital Status: Married  Human resources officer Violence: Not At Risk   Fear of Current or Ex-Partner: No   Emotionally Abused: No   Physically Abused: No   Sexually Abused: No    Review of Systems: See HPI, otherwise negative ROS  Physical Exam: BP 135/85    Pulse 70    Temp (!) 96.7 F (35.9 C) (Temporal)    Resp 18    Ht 5\' 10"  (1.778 m)    Wt 93 kg    SpO2 100%    BMI 29.41 kg/m  General:   Alert,  pleasant and cooperative in NAD Head:  Normocephalic and atraumatic. Neck:  Supple; no masses or thyromegaly. Lungs:  Clear throughout to auscultation.    Heart:  Regular rate and rhythm. Abdomen:  Soft, nontender and nondistended. Normal bowel sounds, without guarding, and without rebound.   Neurologic:  Alert and  oriented x4;  grossly normal neurologically.  Impression/Plan: Christine Ray is here for an colonoscopy to be performed for tubular adenoma of colon  Risks, benefits, limitations, and alternatives regarding  colonoscopy have been reviewed with the patient.  Questions have been answered.  All parties agreeable.   Sherri Sear, MD  11/08/2021, 9:09 AM

## 2021-11-09 ENCOUNTER — Encounter: Payer: Self-pay | Admitting: Gastroenterology

## 2021-11-17 ENCOUNTER — Encounter: Payer: Self-pay | Admitting: Internal Medicine

## 2021-11-17 ENCOUNTER — Ambulatory Visit
Admission: RE | Admit: 2021-11-17 | Discharge: 2021-11-17 | Disposition: A | Discharge status: Home / Self Care | Payer: Medicare HMO | Attending: Internal Medicine | Admitting: Internal Medicine

## 2021-11-17 ENCOUNTER — Ambulatory Visit
Admission: RE | Admit: 2021-11-17 | Discharge: 2021-11-17 | Disposition: A | Discharge status: Home / Self Care | Payer: Medicare HMO | Source: Ambulatory Visit | Attending: Internal Medicine | Admitting: Internal Medicine

## 2021-11-17 ENCOUNTER — Other Ambulatory Visit: Payer: Self-pay

## 2021-11-17 ENCOUNTER — Ambulatory Visit (INDEPENDENT_AMBULATORY_CARE_PROVIDER_SITE_OTHER): Payer: Medicare HMO | Admitting: Internal Medicine

## 2021-11-17 VITALS — BP 112/78 | HR 64 | Ht 71.0 in | Wt 209.0 lb

## 2021-11-17 DIAGNOSIS — M79605 Pain in left leg: Secondary | ICD-10-CM

## 2021-11-17 DIAGNOSIS — M1712 Unilateral primary osteoarthritis, left knee: Secondary | ICD-10-CM | POA: Diagnosis not present

## 2021-11-17 NOTE — Progress Notes (Signed)
? ? ?Date:  11/17/2021  ? ?Name:  Christine Ray   DOB:  [birth year]   MRN:  [id number] ? ? ?Chief Complaint: Leg Pain ? ?Leg Pain  ?Incident onset: X 2weeks. The incident occurred at home. Injury mechanism: table fell on pt leg 6 weeks ago. The pain is present in the left leg. The quality of the pain is described as aching (throbbing, getting worse). The pain is at a severity of 7/10. The pain is mild. The pain has been Constant since onset. Associated symptoms include an inability to bear weight and tingling. Pertinent negatives include no numbness. She reports no foreign bodies present. She has tried heat and acetaminophen (meloxicam, and creams) for the symptoms. The treatment provided mild relief.  ? ?Lab Results  ?Component Value Date  ? NA 140 06/28/2020  ? K 4.0 06/28/2020  ? CO2 34 (A) 06/28/2020  ? GLUCOSE 91 11/30/2019  ? BUN 14 06/28/2020  ? CREATININE 0.9 06/28/2020  ? CALCIUM 8.6 (L) 11/30/2019  ? GFRNONAA >60 11/30/2019  ? ?Lab Results  ?Component Value Date  ? CHOL 199 06/28/2020  ? HDL 55 06/28/2020  ? [place] 117 06/28/2020  ? TRIG 133 06/28/2020  ? CHOLHDL 4.1 10/27/2016  ? ?Lab Results  ?Component Value Date  ? TSH 0.566 12/02/2019  ? ?No results found for: HGBA1C ?Lab Results  ?Component Value Date  ? WBC 6.8 06/28/2020  ? HGB 13.5 06/28/2020  ? HCT 42 06/28/2020  ? MCV 86.8 11/30/2019  ? PLT 147 (A) 06/28/2020  ? ?Lab Results  ?Component Value Date  ? ALT 15 06/18/2019  ? AST 18 06/18/2019  ? ALKPHOS 84 03/27/2017  ? BILITOT 0.9 03/27/2017  ? ?Lab Results  ?Component Value Date  ? VD25OH 31.0 06/17/2018  ?  ? ?Review of Systems  ?Musculoskeletal:  Positive for arthralgias, gait problem and myalgias.  ?Skin:  Positive for color change.  ?Neurological:  Positive for tingling. Negative for weakness and numbness.  ? ?Patient Active Problem List  ? Diagnosis Date Noted  ? History of colonic polyps   ? Polyp of sigmoid colon   ? Tubular adenoma of colon 10/11/2021  ? Muscle spasm of back 10/11/2021  ?  Chronic pain of left knee 04/06/2017  ? Hyperlipidemia 04/26/2016  ? Vitamin D deficiency 04/26/2016  ? BPPV (benign paroxysmal positional vertigo) 04/26/2016  ? Varicose veins of both lower extremities 04/26/2016  ? GERD (gastroesophageal reflux disease) 01/04/2015  ? Osteoporosis 01/04/2015  ? ? ?Allergies  ?Allergen Reactions  ? Amoxicillin Hives  ? Hydrocodone Other (See Comments)  ?  headache  ? Tizanidine Hcl Other (See Comments)  ?  Itching, headache  ? Oxycodone Hcl Rash  ? ? ?Past Surgical History:  ?Procedure Laterality Date  ? COLONOSCOPY WITH PROPOFOL N/A 11/13/2016  ? Procedure: COLONOSCOPY WITH PROPOFOL;  Surgeon: Lollie Sails, MD;  Location[facility] ENDOSCOPY;  Service: Endoscopy;  Laterality: N/A;  ? COLONOSCOPY WITH PROPOFOL N/A 11/08/2021  ? Procedure: COLONOSCOPY WITH PROPOFOL;  Surgeon: Lin Landsman, MD;  Location: [facility] ENDOSCOPY;  Service: Gastroenterology;  Laterality: N/A;  ? ? ?Social History  ? ?Tobacco Use  ? Smoking status: Former  ?  Types: Cigarettes  ?  Quit date: 10/14/1975  ?  Years since quitting: 46.1  ? Smokeless tobacco: Never  ?Vaping Use  ? Vaping Use: Never used  ?Substance Use Topics  ? Alcohol use: No  ? Drug use: No  ? ? ? ?Medication list has been reviewed  and updated. ? ?Current Meds  ?Medication Sig  ? Ascorbic Acid (VITAMIN C) 500 MG CAPS Take 1,000 mg by mouth.  ? aspirin EC 81 MG tablet Take 81 mg by mouth daily as needed (learned that it is good to take everyday).  ? baclofen (LIORESAL) 10 MG tablet Take 1 tablet (10 mg total) by mouth 2 (two) times daily.  ? Cholecalciferol (VITAMIN D) 2000 units tablet Take 2,000 Units by mouth daily.  ? fluticasone (FLONASE) 50 MCG/ACT nasal spray USE 2 SPRAYS IN EACH NOSTRIL EVERY DAY  ? ibandronate (BONIVA) 150 MG tablet Take 1 tablet by mouth every 30 (thirty) days.  ? meclizine (ANTIVERT) 25 MG tablet TAKE 1 TABLET THREE TIMES DAILY AS NEEDED FOR DIZZINESS  ? meloxicam (MOBIC) 15 MG tablet Take 1 tablet (15 mg total) by  mouth daily.  ? Olopatadine HCl 0.2 % SOLN Place 1 drop into both eyes as needed.  ? simvastatin (ZOCOR) 10 MG tablet Take 10 mg by mouth at bedtime.  ? solifenacin (VESICARE) 10 MG tablet Take 10 mg by mouth daily.  ? timolol (TIMOPTIC) 0.5 % ophthalmic solution Place 1 drop into both eyes in the morning.  ? ? ?PHQ 2/9 Scores 11/17/2021 10/11/2021 03/14/2021 02/16/2021  ?PHQ - 2 Score 0 0 0 0  ?PHQ- 9 Score 2 0 - 0  ? ? ?GAD 7 : Generalized Anxiety Score 11/17/2021 10/11/2021 02/16/2021  ?Nervous, Anxious, on Edge 0 0 0  ?Control/stop worrying 0 0 0  ?Worry too much - different things 0 0 0  ?Trouble relaxing 0 0 0  ?Restless 0 0 0  ?Easily annoyed or irritable 0 0 0  ?Afraid - awful might happen 0 0 0  ?Total GAD 7 Score 0 0 0  ?Anxiety Difficulty - Not difficult at all Not difficult at all  ? ? ?BP Readings from Last 3 Encounters:  ?11/17/21 112/78  ?11/08/21 136/68  ?10/11/21 124/76  ? ? ?Physical Exam ?Vitals and nursing note reviewed.  ?Constitutional:   ?   General: She is not in acute distress. ?   Appearance: She is well-developed.  ?HENT:  ?   Head: Normocephalic and atraumatic.  ?Cardiovascular:  ?   Pulses:     ?     Dorsalis pedis pulses are 1+ on the right side and 1+ on the left side.  ?     Posterior tibial pulses are 1+ on the right side and 1+ on the left side.  ?Pulmonary:  ?   Effort: Pulmonary effort is normal. No respiratory distress.  ?Musculoskeletal:  ?   Left lower leg: Tenderness present. No deformity or bony tenderness. 1+ Edema present.  ?     Legs: ? ?   Comments: Mild discoloration and tenderness ?No calf cord, Homan's negative  ?Skin: ?   General: Skin is warm and dry.  ?   Findings: No rash.  ?Neurological:  ?   Mental Status: She is alert and oriented to person, place, and time.  ?Psychiatric:     ?   Mood and Affect: Mood normal.     ?   Behavior: Behavior normal.  ? ? ?Wt Readings from Last 3 Encounters:  ?11/17/21 209 lb (94.8 kg)  ?11/08/21 205 lb (93 kg)  ?10/11/21 209 lb 6.4 oz (95 kg)   ? ? ?BP 112/78   Pulse 64   Ht 5\' 11"  (1.803 m)   Wt 209 lb (94.8 kg)   SpO2 96%   BMI 29.15  kg/m?  ? ?Assessment and Plan: ?1. Pain of left lower extremity ?S/p direct impact from a folding table about 7 weeks ago ?Still has discomfort to palpation and ambulation. ?Continue elevation, Mobic, moist heat ?Will get Xray to rule out fracture ?- DG Tibia/Fibula Left ? ? ?Partially dictated using Editor, commissioning. Any errors are unintentional. ? ?Halina Maidens, MD ?[facility] ?[facility] ? ?11/17/2021 ? ? ? ? ?

## 2021-11-23 DIAGNOSIS — H40013 Open angle with borderline findings, low risk, bilateral: Secondary | ICD-10-CM | POA: Diagnosis not present

## 2021-11-23 DIAGNOSIS — H25812 Combined forms of age-related cataract, left eye: Secondary | ICD-10-CM | POA: Diagnosis not present

## 2021-12-12 DIAGNOSIS — H40011 Open angle with borderline findings, low risk, right eye: Secondary | ICD-10-CM | POA: Diagnosis not present

## 2021-12-12 DIAGNOSIS — H2511 Age-related nuclear cataract, right eye: Secondary | ICD-10-CM | POA: Diagnosis not present

## 2021-12-12 DIAGNOSIS — H2512 Age-related nuclear cataract, left eye: Secondary | ICD-10-CM | POA: Diagnosis not present

## 2021-12-12 DIAGNOSIS — H40013 Open angle with borderline findings, low risk, bilateral: Secondary | ICD-10-CM | POA: Diagnosis not present

## 2021-12-12 DIAGNOSIS — H25811 Combined forms of age-related cataract, right eye: Secondary | ICD-10-CM | POA: Diagnosis not present

## 2022-01-02 DIAGNOSIS — H2512 Age-related nuclear cataract, left eye: Secondary | ICD-10-CM | POA: Diagnosis not present

## 2022-01-02 DIAGNOSIS — H2511 Age-related nuclear cataract, right eye: Secondary | ICD-10-CM | POA: Diagnosis not present

## 2022-01-02 DIAGNOSIS — H25812 Combined forms of age-related cataract, left eye: Secondary | ICD-10-CM | POA: Diagnosis not present

## 2022-01-02 DIAGNOSIS — H40013 Open angle with borderline findings, low risk, bilateral: Secondary | ICD-10-CM | POA: Diagnosis not present

## 2022-01-02 DIAGNOSIS — H40012 Open angle with borderline findings, low risk, left eye: Secondary | ICD-10-CM | POA: Diagnosis not present

## 2022-01-18 ENCOUNTER — Ambulatory Visit: Payer: Medicare HMO | Admitting: Internal Medicine

## 2022-01-25 ENCOUNTER — Ambulatory Visit: Payer: Medicare HMO | Admitting: Internal Medicine

## 2022-02-09 ENCOUNTER — Ambulatory Visit: Payer: Medicare HMO | Admitting: Internal Medicine

## 2022-02-14 ENCOUNTER — Ambulatory Visit (INDEPENDENT_AMBULATORY_CARE_PROVIDER_SITE_OTHER): Payer: Medicare HMO | Admitting: Internal Medicine

## 2022-02-14 ENCOUNTER — Encounter: Payer: Self-pay | Admitting: Internal Medicine

## 2022-02-14 VITALS — BP 104/76 | HR 67 | Ht 71.0 in | Wt 214.0 lb

## 2022-02-14 DIAGNOSIS — M62830 Muscle spasm of back: Secondary | ICD-10-CM

## 2022-02-14 DIAGNOSIS — Z0000 Encounter for general adult medical examination without abnormal findings: Secondary | ICD-10-CM

## 2022-02-14 DIAGNOSIS — D485 Neoplasm of uncertain behavior of skin: Secondary | ICD-10-CM

## 2022-02-14 DIAGNOSIS — Z1159 Encounter for screening for other viral diseases: Secondary | ICD-10-CM | POA: Diagnosis not present

## 2022-02-14 DIAGNOSIS — E782 Mixed hyperlipidemia: Secondary | ICD-10-CM

## 2022-02-14 DIAGNOSIS — E559 Vitamin D deficiency, unspecified: Secondary | ICD-10-CM | POA: Diagnosis not present

## 2022-02-14 DIAGNOSIS — Z23 Encounter for immunization: Secondary | ICD-10-CM

## 2022-02-14 DIAGNOSIS — Z1231 Encounter for screening mammogram for malignant neoplasm of breast: Secondary | ICD-10-CM

## 2022-02-14 LAB — POCT URINALYSIS DIPSTICK
Bilirubin, UA: NEGATIVE
Blood, UA: NEGATIVE
Glucose, UA: NEGATIVE
Ketones, UA: NEGATIVE
Leukocytes, UA: NEGATIVE
Nitrite, UA: NEGATIVE
Protein, UA: NEGATIVE
Spec Grav, UA: 1.015 (ref 1.010–1.025)
Urobilinogen, UA: 0.2 E.U./dL
pH, UA: 7 (ref 5.0–8.0)

## 2022-02-14 MED ORDER — MELOXICAM 15 MG PO TABS: 15.0000 mg | ORAL_TABLET | Freq: Every day | ORAL | 1 refills | Status: DC

## 2022-02-14 NOTE — Progress Notes (Signed)
Date:  02/14/2022   Name:  Christine Ray   DOB:  01-11-1956   MRN:  [id number]   Chief Complaint: Annual Exam Christine Ray is a 66 y.o. female who presents today for her Complete Annual Exam. She feels well. She reports not exercising. She reports she is sleeping well. Breast complaints - none.  Mammogram: 06/2021 - ordered by GYN DEXA: 06/2020 [facility] - ordered by Endo Pap smear: discontinued Colonoscopy: 10/2021 repeat 5 yrs  Health Maintenance Due  Topic Date Due   Hepatitis C Screening  Never done   Pneumonia Vaccine 58+ Years old (2 - PPSV23 if available, else PCV20) 01/29/2020    Immunization History  Administered Date(s) Administered   Fluad Quad(high Dose 65+) 06/13/2019, 06/13/2021   Influenza Inj Mdck Quad With Preservative 06/20/2018   Influenza,inj,Quad PF,6+ Mos 10/27/2016, 06/22/2020   Influenza-Unspecified 10/27/2016, 06/20/2018   Moderna Sars-Covid-2 Vaccination 10/05/2019, 11/02/2019, 07/20/2020, 07/14/2021   Pneumococcal Conjugate-13 01/29/2019   Tdap 06/11/2018   Zoster Recombinat (Shingrix) 05/15/2018, 07/19/2018    HPI  Lab Results  Component Value Date   NA 140 06/28/2020   K 4.0 06/28/2020   CO2 34 (A) 06/28/2020   GLUCOSE 91 11/30/2019   BUN 14 06/28/2020   CREATININE 0.9 06/28/2020   CALCIUM 8.6 (L) 11/30/2019   GFRNONAA >60 11/30/2019   Lab Results  Component Value Date   CHOL 199 06/28/2020   HDL 55 06/28/2020   LDLCALC 117 06/28/2020   TRIG 133 06/28/2020   CHOLHDL 4.1 10/27/2016   Lab Results  Component Value Date   TSH 0.566 12/02/2019   No results found for: HGBA1C Lab Results  Component Value Date   WBC 6.8 06/28/2020   HGB 13.5 06/28/2020   HCT 42 06/28/2020   MCV 86.8 11/30/2019   PLT 147 (A) 06/28/2020   Lab Results  Component Value Date   ALT 15 06/18/2019   AST 18 06/18/2019   ALKPHOS 84 03/27/2017   BILITOT 0.9 03/27/2017   Lab Results  Component Value Date   VD25OH 31.0 06/17/2018     Review of  Systems  Constitutional:  Negative for chills, fatigue and fever.  HENT:  Negative for congestion, hearing loss, tinnitus, trouble swallowing and voice change.   Eyes:  Negative for visual disturbance.  Respiratory:  Negative for cough, chest tightness, shortness of breath and wheezing.   Cardiovascular:  Negative for chest pain, palpitations and leg swelling.  Gastrointestinal:  Negative for abdominal pain, constipation, diarrhea and vomiting.  Endocrine: Negative for polydipsia and polyuria.  Genitourinary:  Negative for dysuria, frequency, genital sores, vaginal bleeding and vaginal discharge.  Musculoskeletal:  Positive for gait problem and myalgias. Negative for joint swelling. Arthralgias: left hip and knees. Skin:  Negative for color change and rash.  Neurological:  Negative for dizziness, tremors, light-headedness and headaches.  Hematological:  Negative for adenopathy. Does not bruise/bleed easily.  Psychiatric/Behavioral:  Negative for dysphoric mood and sleep disturbance. The patient is not nervous/anxious.    Patient Active Problem List   Diagnosis Date Noted   History of colonic polyps    Polyp of sigmoid colon    Tubular adenoma of colon 10/11/2021   Muscle spasm of back 10/11/2021   Chronic pain of left knee 04/06/2017   Hyperlipidemia 04/26/2016   Vitamin D deficiency 04/26/2016   BPPV (benign paroxysmal positional vertigo) 04/26/2016   Varicose veins of both lower extremities 04/26/2016   GERD (gastroesophageal reflux disease) 01/04/2015   Osteoporosis 01/04/2015  Allergies  Allergen Reactions   Amoxicillin Hives   Hydrocodone Other (See Comments)    headache   Tizanidine Hcl Other (See Comments)    Itching, headache   Oxycodone Hcl Rash    Past Surgical History:  Procedure Laterality Date   COLONOSCOPY WITH PROPOFOL N/A 11/13/2016   Procedure: COLONOSCOPY WITH PROPOFOL;  Surgeon: Lollie Sails, MD;  Location: [facility] ENDOSCOPY;  Service: Endoscopy;   Laterality: N/A;   COLONOSCOPY WITH PROPOFOL N/A 11/08/2021   Procedure: COLONOSCOPY WITH PROPOFOL;  Surgeon: Lin Landsman, MD;  Location: [facility] ENDOSCOPY;  Service: Gastroenterology;  Laterality: N/A;    Social History   Tobacco Use   Smoking status: Former    Types: Cigarettes    Quit date: 10/14/1975    Years since quitting: 46.3   Smokeless tobacco: Never  Vaping Use   Vaping Use: Never used  Substance Use Topics   Alcohol use: No   Drug use: No     Medication list has been reviewed and updated.  Current Meds  Medication Sig   Ascorbic Acid (VITAMIN C) 500 MG CAPS Take 1,000 mg by mouth.   aspirin EC 81 MG tablet Take 81 mg by mouth daily as needed (learned that it is good to take everyday).   baclofen (LIORESAL) 10 MG tablet Take 1 tablet (10 mg total) by mouth 2 (two) times daily.   Cholecalciferol (VITAMIN D) 2000 units tablet Take 2,000 Units by mouth daily.   fluticasone (FLONASE) 50 MCG/ACT nasal spray USE 2 SPRAYS IN EACH NOSTRIL EVERY DAY   ibandronate (BONIVA) 150 MG tablet Take 1 tablet by mouth every 30 (thirty) days.   meclizine (ANTIVERT) 25 MG tablet TAKE 1 TABLET THREE TIMES DAILY AS NEEDED FOR DIZZINESS   meloxicam (MOBIC) 15 MG tablet Take 1 tablet (15 mg total) by mouth daily.   simvastatin (ZOCOR) 10 MG tablet Take 10 mg by mouth at bedtime.   solifenacin (VESICARE) 5 MG tablet Take 5 mg by mouth daily.   timolol (TIMOPTIC) 0.5 % ophthalmic solution Place 1 drop into both eyes in the morning.       02/14/2022    9:47 AM 11/17/2021    1:26 PM 10/11/2021    1:56 PM 02/16/2021    9:25 AM  GAD 7 : Generalized Anxiety Score  Nervous, Anxious, on Edge 0 0 0 0  Control/stop worrying 0 0 0 0  Worry too much - different things 0 0 0 0  Trouble relaxing 0 0 0 0  Restless 0 0 0 0  Easily annoyed or irritable 0 0 0 0  Afraid - awful might happen 0 0 0 0  Total GAD 7 Score 0 0 0 0  Anxiety Difficulty Not difficult at all  Not difficult at all Not difficult  at all       02/14/2022    9:46 AM  Depression screen PHQ 2/9  Decreased Interest 0  Down, Depressed, Hopeless 0  PHQ - 2 Score 0  Altered sleeping 0  Tired, decreased energy 0  Change in appetite 0  Feeling bad or failure about yourself  0  Trouble concentrating 0  Moving slowly or fidgety/restless 0  Suicidal thoughts 0  PHQ-9 Score 0  Difficult doing work/chores Not difficult at all    BP Readings from Last 3 Encounters:  02/14/22 104/76  11/17/21 112/78  11/08/21 136/68    Physical Exam Vitals and nursing note reviewed.  Constitutional:      General: She  is not in acute distress.    Appearance: She is well-developed.  HENT:     Head: Normocephalic and atraumatic.     Right Ear: Tympanic membrane and ear canal normal.     Left Ear: Tympanic membrane and ear canal normal.     Nose:     Right Sinus: No maxillary sinus tenderness.     Left Sinus: No maxillary sinus tenderness.  Eyes:     General: No scleral icterus.       Right eye: No discharge.        Left eye: No discharge.     Conjunctiva/sclera: Conjunctivae normal.  Neck:     Thyroid: No thyromegaly.     Vascular: No carotid bruit.  Cardiovascular:     Rate and Rhythm: Normal rate and regular rhythm.     Pulses: Normal pulses.     Heart sounds: Normal heart sounds.  Pulmonary:     Effort: Pulmonary effort is normal. No respiratory distress.     Breath sounds: No wheezing.  Chest:  Breasts:    Right: No mass, nipple discharge, skin change or tenderness.     Left: No mass, nipple discharge, skin change or tenderness.  Abdominal:     General: Bowel sounds are normal.     Palpations: Abdomen is soft.     Tenderness: There is no abdominal tenderness.  Musculoskeletal:     Cervical back: Normal range of motion. No erythema.     Right lower leg: No edema.     Left lower leg: No edema.  Lymphadenopathy:     Cervical: No cervical adenopathy.  Skin:    General: Skin is warm and dry.     Capillary  Refill: Capillary refill takes less than 2 seconds.     Findings: Lesion (scaly patch with dark pigment ring on right lower leg) present. No rash.  Neurological:     General: No focal deficit present.     Mental Status: She is alert and oriented to person, place, and time.     Cranial Nerves: No cranial nerve deficit.     Sensory: No sensory deficit.     Deep Tendon Reflexes: Reflexes are normal and symmetric.  Psychiatric:        Attention and Perception: Attention normal.        Mood and Affect: Mood normal.    Wt Readings from Last 3 Encounters:  02/14/22 214 lb (97.1 kg)  11/17/21 209 lb (94.8 kg)  11/08/21 205 lb (93 kg)    BP 104/76   Pulse 67   Ht '5\' 11"'$  (1.803 m)   Wt 214 lb (97.1 kg)   SpO2 97%   BMI 29.85 kg/m   Assessment and Plan: 1. Annual physical exam Normal exam Continue healthy diet and exercise - CBC with Differential/Platelet - TSH - POCT urinalysis dipstick  2. Encounter for screening mammogram for breast cancer Ordered in October by GYN  3. Need for hepatitis C screening test - Hepatitis C antibody  4. Need for vaccination for pneumococcus - Pneumococcal polysaccharide vaccine 23-valent greater than or equal to 2yo subcutaneous/IM  5. Mixed hyperlipidemia On Zocor prescribed by GYN - Comprehensive metabolic panel - Lipid panel  6. Vitamin D deficiency Continue supplementation with calcium and Alendronate for osteoporosis - VITAMIN D 25 Hydroxy (Vit-D Deficiency, Fractures)  7. Neoplasm of uncertain behavior of skin - Ambulatory referral to Dermatology  8. Muscle spasm of back Recommend Baclofen as needed Mobic for other aches and  joint pains - meloxicam (MOBIC) 15 MG tablet; Take 1 tablet (15 mg total) by mouth daily.  Dispense: 90 tablet; Refill: 1   Partially dictated using Editor, commissioning. Any errors are unintentional.  Halina Maidens, MD [facility]  02/14/2022

## 2022-02-15 LAB — CBC WITH DIFFERENTIAL/PLATELET
Basophils Absolute: 0 10*3/uL (ref 0.0–0.2)
Basos: 0 %
EOS (ABSOLUTE): 0.1 10*3/uL (ref 0.0–0.4)
Eos: 2 %
Hematocrit: 40.7 % (ref 34.0–46.6)
Hemoglobin: 13.5 g/dL (ref 11.1–15.9)
Immature Grans (Abs): 0 10*3/uL (ref 0.0–0.1)
Immature Granulocytes: 0 %
Lymphocytes Absolute: 2.4 10*3/uL (ref 0.7–3.1)
Lymphs: 43 %
MCH: 27.6 pg (ref 26.6–33.0)
MCHC: 33.2 g/dL (ref 31.5–35.7)
MCV: 83 fL (ref 79–97)
Monocytes Absolute: 0.4 10*3/uL (ref 0.1–0.9)
Monocytes: 7 %
Neutrophils Absolute: 2.7 10*3/uL (ref 1.4–7.0)
Neutrophils: 48 %
Platelets: 191 10*3/uL (ref 150–450)
RBC: 4.89 x10E6/uL (ref 3.77–5.28)
RDW: 13.7 % (ref 11.7–15.4)
WBC: 5.7 10*3/uL (ref 3.4–10.8)

## 2022-02-15 LAB — COMPREHENSIVE METABOLIC PANEL
ALT: 15 IU/L (ref 0–32)
AST: 16 IU/L (ref 0–40)
Albumin/Globulin Ratio: 1.6 (ref 1.2–2.2)
Albumin: 4.2 g/dL (ref 3.8–4.8)
Alkaline Phosphatase: 64 IU/L (ref 44–121)
BUN/Creatinine Ratio: 17 (ref 12–28)
BUN: 15 mg/dL (ref 8–27)
Bilirubin Total: 0.4 mg/dL (ref 0.0–1.2)
CO2: 25 mmol/L (ref 20–29)
Calcium: 9 mg/dL (ref 8.7–10.3)
Chloride: 103 mmol/L (ref 96–106)
Creatinine, Ser: 0.88 mg/dL (ref 0.57–1.00)
Globulin, Total: 2.7 g/dL (ref 1.5–4.5)
Glucose: 77 mg/dL (ref 70–99)
Potassium: 4.8 mmol/L (ref 3.5–5.2)
Sodium: 140 mmol/L (ref 134–144)
Total Protein: 6.9 g/dL (ref 6.0–8.5)
eGFR: 72 mL/min/{1.73_m2} (ref >59)

## 2022-02-15 LAB — LIPID PANEL
Chol/HDL Ratio: 3.4 ratio (ref 0.0–4.4)
Cholesterol, Total: 196 mg/dL (ref 100–199)
HDL: 58 mg/dL (ref >39)
LDL Chol Calc (NIH): 124 mg/dL — ABNORMAL HIGH (ref 0–99)
Triglycerides: 79 mg/dL (ref 0–149)
VLDL Cholesterol Cal: 14 mg/dL (ref 5–40)

## 2022-02-15 LAB — TSH: TSH: 1.16 u[IU]/mL (ref 0.450–4.500)

## 2022-02-15 LAB — VITAMIN D 25 HYDROXY (VIT D DEFICIENCY, FRACTURES): Vit D, 25-Hydroxy: 35.6 ng/mL (ref 30.0–100.0)

## 2022-02-15 LAB — HEPATITIS C ANTIBODY: Hep C Virus Ab: NONREACTIVE

## 2022-03-15 ENCOUNTER — Ambulatory Visit (INDEPENDENT_AMBULATORY_CARE_PROVIDER_SITE_OTHER): Payer: Medicare HMO

## 2022-03-15 DIAGNOSIS — Z961 Presence of intraocular lens: Secondary | ICD-10-CM | POA: Diagnosis not present

## 2022-03-15 DIAGNOSIS — Z0000 Encounter for general adult medical examination without abnormal findings: Secondary | ICD-10-CM

## 2022-03-15 NOTE — Progress Notes (Signed)
Subjective:   Christine Ray is a 66 y.o. female who presents for Medicare Annual (Subsequent) preventive examination.  Virtual Visit via Telephone Note  I connected with  Christine Ray on 03/15/22 at  9:45 AM EDT by telephone and verified that I am speaking with the correct person using two identifiers.  Location: Patient: home Provider: [facility] Persons participating in the virtual visit: [place]   I discussed the limitations, risks, security and privacy concerns of performing an evaluation and management service by telephone and the availability of in person appointments. The patient expressed understanding and agreed to proceed.  Interactive audio and video telecommunications were attempted between this nurse and patient, however failed, due to patient having technical difficulties OR patient did not have access to video capability.  We continued and completed visit with audio only.  Some vital signs may be absent or patient reported.   Clemetine Marker, LPN   Review of Systems     Cardiac Risk Factors include: advanced age (>59mn, >>37women);dyslipidemia     Objective:    There were no vitals filed for this visit. There is no height or weight on file to calculate BMI.     03/15/2022    9:22 AM 11/08/2021    8:30 AM 03/14/2021    9:36 AM 03/10/2020    9:33 AM 11/30/2019   10:55 PM 03/27/2017    8:47 AM 11/13/2016    8:54 AM  Advanced Directives  Does Patient Have a Medical Advance Directive? Yes No Yes Yes No No Yes  Type of AParamedicof [place]ubyLiving will  [place]. [place] will [place] will   Living will  Copy of HWinslowin Chart? No - copy requested  No - copy requested No - copy requested     Would patient like information on creating a medical advance directive?     No - Patient declined No - Patient declined     Current Medications (verified) Outpatient  Encounter Medications as of 03/15/2022  Medication Sig   Ascorbic Acid (VITAMIN C) 500 MG CAPS Take 1,000 mg by mouth.   aspirin EC 81 MG tablet Take 81 mg by mouth daily as needed (learned that it is good to take everyday).   baclofen (LIORESAL) 10 MG tablet Take 1 tablet (10 mg total) by mouth 2 (two) times daily.   Cholecalciferol (VITAMIN D) 2000 units tablet Take 2,000 Units by mouth daily.   fluticasone (FLONASE) 50 MCG/ACT nasal spray USE 2 SPRAYS IN EACH NOSTRIL EVERY DAY   ibandronate (BONIVA) 150 MG tablet Take 1 tablet by mouth every 30 (thirty) days.   meclizine (ANTIVERT) 25 MG tablet TAKE 1 TABLET THREE TIMES DAILY AS NEEDED FOR DIZZINESS   meloxicam (MOBIC) 15 MG tablet Take 1 tablet (15 mg total) by mouth daily.   omeprazole (PRILOSEC) 40 MG capsule Take 1 capsule by mouth daily as needed.   simvastatin (ZOCOR) 10 MG tablet Take 10 mg by mouth at bedtime.   solifenacin (VESICARE) 5 MG tablet Take 5 mg by mouth daily.   timolol (TIMOPTIC) 0.5 % ophthalmic solution Place 1 drop into both eyes in the morning.   No facility-administered encounter medications on file as of 03/15/2022.    Allergies (verified) Amoxicillin, Hydrocodone, Tizanidine hcl, and Oxycodone hcl   History: Past Medical History:  Diagnosis Date   Hyperlipidemia    Hyperlipidemia    Lateral epicondylitis of right elbow 01/04/2015  Past Surgical History:  Procedure Laterality Date   COLONOSCOPY WITH PROPOFOL N/A 11/13/2016   Procedure: COLONOSCOPY WITH PROPOFOL;  Surgeon: Lollie Sails, MD;  Location: [facility] ENDOSCOPY;  Service: Endoscopy;  Laterality: N/A;   COLONOSCOPY WITH PROPOFOL N/A 11/08/2021   Procedure: COLONOSCOPY WITH PROPOFOL;  Surgeon: Lin Landsman, MD;  Location[facility] ENDOSCOPY;  Service: Gastroenterology;  Laterality: N/A;   Family History  Problem Relation Age of Onset   Breast cancer Mother 68   Prostate cancer Father    Throat cancer Brother    Social History    Socioeconomic History   Marital status: Married    Spouse name: Not on file   Number of children: 0   Years of education: Not on file   Highest education level: High school graduate  Occupational History   Not on file  Tobacco Use   Smoking status: Former    Types: Cigarettes    Quit date: 10/14/1975    Years since quitting: 46.4   Smokeless tobacco: Never  Vaping Use   Vaping Use: Never used  Substance and Sexual Activity   Alcohol use: No   Drug use: No   Sexual activity: Not on file  Other Topics Concern   Not on file  Social History Narrative   Not on file   Social Determinants of Health   Financial Resource Strain: Low Risk  (03/15/2022)   Overall Financial Resource Strain (CARDIA)    Difficulty of Paying Living Expenses: Not hard at all  Food Insecurity: No Food Insecurity (03/15/2022)   Hunger Vital Sign    Worried About Running Out of Food in the Last Year: Never true    Ran Out of Food in the Last Year: Never true  Transportation Needs: No Transportation Needs (03/15/2022)   PRAPARE - Hydrologist (Medical): No    Lack of Transportation (Non-Medical): No  Physical Activity: Inactive (03/15/2022)   Exercise Vital Sign    Days of Exercise per Week: 0 days    Minutes of Exercise per Session: 0 min  Stress: No Stress Concern Present (03/15/2022)   [place]    Feeling of Stress : Only a little  Social Connections: Moderately Integrated (03/15/2022)   Social Connection and Isolation Panel [NHANES]    Frequency of Communication with Friends and Family: More than three times a week    Frequency of Social Gatherings with Friends and Family: Once a week    Attends Religious Services: More than 4 times per year    Active Member of [employer] or Organizations: No    Attends Music therapist: Never    Marital Status: Married    Tobacco Counseling Counseling given:  Not Answered   Clinical Intake:  Pre-visit preparation completed: Yes  Pain : No/denies pain     Nutritional Risks: None Diabetes: No  How often do you need to have someone help you when you read instructions, pamphlets, or other written materials from your doctor or pharmacy?: 1 - Never    Interpreter Needed?: No  Information entered by :: Clemetine Marker LPN   Activities of Daily Living    03/15/2022    9:24 AM 11/17/2021    1:27 PM  In your present state of health, do you have any difficulty performing the following activities:  Hearing? 0 0  Vision? 0 0  Difficulty concentrating or making decisions? 0 0  Walking or climbing stairs? 0  1  Dressing or bathing? 0 0  Doing errands, shopping? 0 0  Preparing Food and eating ? N   Using the Toilet? N   In the past six months, have you accidently leaked urine? N   Do you have problems with loss of bowel control? N   Managing your Medications? N   Managing your Finances? N   Housekeeping or managing your Housekeeping? N     Patient Care Team: Glean Hess, MD as PCP - General (Internal Medicine) Schermerhorn, Gwen Her, MD as Referring Physician (Obstetrics and Gynecology) Gabriel Carina Betsey Holiday, MD as Physician Assistant (Endocrinology)  Indicate any recent Medical Services you may have received from other than [facility] providers in the past year (date may be approximate).     Assessment:   This is a routine wellness examination for Christine Ray.  Hearing/Vision screen Hearing Screening - Comments:: Pt denies hearing difficulty Vision Screening - Comments:: Annual vision screenings with Dr. Glennon Mac at [facility] in [place]  Dietary issues and exercise activities discussed: Current Exercise Habits: The patient does not participate in regular exercise at present, Exercise limited by: None identified   Goals Addressed   None    Depression Screen    03/15/2022    9:21 AM 02/14/2022    9:46 AM 11/17/2021    1:26 PM 10/11/2021     1:56 PM 03/14/2021    9:35 AM 02/16/2021    9:25 AM 03/10/2020    9:31 AM  PHQ 2/9 Scores  PHQ - 2 Score 0 0 0 0 0 0 0  PHQ- 9 Score  0 2 0  0     Fall Risk    03/15/2022    9:24 AM 02/14/2022    9:47 AM 11/17/2021    1:27 PM 10/11/2021    1:57 PM 03/14/2021    9:37 AM  [place] in the past year? 0 0 0 0 0  Number falls in past yr: 0 0 0 0 0  Injury with Fall? 0 0 0 0 0  Risk for fall due to : No Fall Risks No Fall Risks No Fall Risks No Fall Risks No Fall Risks  Follow up Falls prevention discussed Falls evaluation completed Falls evaluation completed Falls evaluation completed Falls prevention discussed    FALL RISK PREVENTION PERTAINING TO THE HOME:  Any stairs in or around the home? Yes  If so, are there any without handrails? No  Home free of loose throw rugs in walkways, pet beds, electrical cords, etc? Yes  Adequate lighting in your home to reduce risk of falls? Yes   ASSISTIVE DEVICES UTILIZED TO PREVENT FALLS:  Life alert? No  Use of a cane, walker or w/c? No  Grab bars in the bathroom? No  Shower chair or bench in shower? Yes  Elevated toilet seat or a handicapped toilet? No   TIMED UP AND GO:  Was the test performed? No . Telephonic visit.   Cognitive Function: Normal cognitive status assessed by direct observation by this Nurse Health Advisor. No abnormalities found.          Immunizations Immunization History  Administered Date(s) Administered   Fluad Quad(high Dose 65+) 06/13/2019, 06/13/2021   Influenza Inj Mdck Quad With Preservative 06/20/2018   Influenza,inj,Quad PF,6+ Mos 10/27/2016, 06/22/2020   Influenza-Unspecified 10/27/2016, 06/20/2018   Moderna Sars-Covid-2 Vaccination 10/05/2019, 11/02/2019, 07/20/2020, 07/14/2021   PNEUMOCOCCAL CONJUGATE-20 02/14/2022   Pneumococcal Conjugate-13 01/29/2019   Tdap 06/11/2018   Zoster Recombinat (Shingrix)  05/15/2018, 07/19/2018    TDAP status: Up to date  Flu Vaccine status: Up to  date  Pneumococcal vaccine status: Up to date  Covid-19 vaccine status: Completed vaccines  Qualifies for Shingles Vaccine? Yes   Zostavax completed No   Shingrix Completed?: Yes  Screening Tests Health Maintenance  Topic Date Due   COVID-19 Vaccine (5 - Booster for Moderna series) 09/08/2021   INFLUENZA VACCINE  04/18/2022   MAMMOGRAM  07/14/2022   COLONOSCOPY (Pts 45-59yr Insurance coverage will need to be confirmed)  11/08/2026   TETANUS/TDAP  06/11/2028   Pneumonia Vaccine 68 Years old  Completed   DEXA SCAN  Completed   Hepatitis C Screening  Completed   Zoster Vaccines- Shingrix  Completed   HPV VACCINES  Aged Out    Health Maintenance  Health Maintenance Due  Topic Date Due   COVID-19 Vaccine (5 - Booster for Moderna series) 09/08/2021    Colorectal cancer screening: Type of screening: Colonoscopy. Completed 11/08/21. Repeat every 5 years  Mammogram status: Completed 07/14/21. Repeat every year  Bone Density status: Completed 06/28/20. Results reflect: Bone density results: OSTEOPENIA. Repeat every 2 years.  Lung Cancer Screening: (Low Dose CT Chest recommended if Age 25101-80years, 30 pack-year currently smoking OR have quit w/in 15years.) does not qualify.   Additional Screening:  Hepatitis C Screening: does qualify; Completed 02/14/22  Vision Screening: Recommended annual ophthalmology exams for early detection of glaucoma and other disorders of the eye. Is the patient up to date with their annual eye exam?  Yes  Who is the provider or what is the name of the office in which the patient attends annual eye exams? [facility].   Dental Screening: Recommended annual dental exams for proper oral hygiene  Community Resource Referral / Chronic Care Management: CRR required this visit?  No   CCM required this visit?  No      Plan:     I have personally reviewed and noted the following in the patient's chart:   Medical and social history Use of  alcohol, tobacco or illicit drugs  Current medications and supplements including opioid prescriptions.  Functional ability and status Nutritional status Physical activity Advanced directives List of other physicians Hospitalizations, surgeries, and ER visits in previous 12 months Vitals Screenings to include cognitive, depression, and falls Referrals and appointments  In addition, I have reviewed and discussed with patient certain preventive protocols, quality metrics, and best practice recommendations. A written personalized care plan for preventive services as well as general preventive health recommendations were provided to patient.     KClemetine Marker LPN   [id number]  Nurse Notes: none

## 2022-03-15 NOTE — Patient Instructions (Signed)
Christine Ray , Thank you for taking time to come for your Medicare Wellness Visit. I appreciate your ongoing commitment to your health goals. Please review the following plan we discussed and let me know if I can assist you in the future.   Screening recommendations/referrals: Colonoscopy: done 11/08/21. Repeat 10/2026 Mammogram: done 07/14/21 Bone Density: done 06/28/20 Recommended yearly ophthalmology/optometry visit for glaucoma screening and checkup Recommended yearly dental visit for hygiene and checkup  Vaccinations: Influenza vaccine: done 06/13/21 Pneumococcal vaccine: done 02/14/22 Tdap vaccine: done 06/11/18 Shingles vaccine: done 05/15/18 & 07/19/18   Covid-19:done 10/05/19, 11/02/19, 07/20/20 & 07/14/21  Advanced directives: Please bring a copy of your health care power of attorney and living will to the office at your convenience.   Conditions/risks identified: Keep up the great work!  Next appointment: Follow up in one year for your annual wellness visit    Preventive Care 65 Years and Older, Female Preventive care refers to lifestyle choices and visits with your health care provider that can promote health and wellness. What does preventive care include? A yearly physical exam. This is also called an annual well check. Dental exams once or twice a year. Routine eye exams. Ask your health care provider how often you should have your eyes checked. Personal lifestyle choices, including: Daily care of your teeth and gums. Regular physical activity. Eating a healthy diet. Avoiding tobacco and drug use. Limiting alcohol use. Practicing safe sex. Taking low-dose aspirin every day. Taking vitamin and mineral supplements as recommended by your health care provider. What happens during an annual well check? The services and screenings done by your health care provider during your annual well check will depend on your age, overall health, lifestyle risk factors, and family history of  disease. Counseling  Your health care provider may ask you questions about your: Alcohol use. Tobacco use. Drug use. Emotional well-being. Home and relationship well-being. Sexual activity. Eating habits. History of falls. Memory and ability to understand (cognition). Work and work Statistician. Reproductive health. Screening  You may have the following tests or measurements: Height, weight, and BMI. Blood pressure. Lipid and cholesterol levels. These may be checked every 5 years, or more frequently if you are over 101 years old. Skin check. Lung cancer screening. You may have this screening every year starting at age 38 if you have a 30-pack-year history of smoking and currently smoke or have quit within the past 15 years. Fecal occult blood test (FOBT) of the stool. You may have this test every year starting at age 41. Flexible sigmoidoscopy or colonoscopy. You may have a sigmoidoscopy every 5 years or a colonoscopy every 10 years starting at age 38. Hepatitis C blood test. Hepatitis B blood test. Sexually transmitted disease (STD) testing. Diabetes screening. This is done by checking your blood sugar (glucose) after you have not eaten for a while (fasting). You may have this done every 1-3 years. Bone density scan. This is done to screen for osteoporosis. You may have this done starting at age 45. Mammogram. This may be done every 1-2 years. Talk to your health care provider about how often you should have regular mammograms. Talk with your health care provider about your test results, treatment options, and if necessary, the need for more tests. Vaccines  Your health care provider may recommend certain vaccines, such as: Influenza vaccine. This is recommended every year. Tetanus, diphtheria, and acellular pertussis (Tdap, Td) vaccine. You may need a Td booster every 10 years. Zoster vaccine. You may  need this after age 51. Pneumococcal 13-valent conjugate (PCV13) vaccine. One  dose is recommended after age 42. Pneumococcal polysaccharide (PPSV23) vaccine. One dose is recommended after age 82. Talk to your health care provider about which screenings and vaccines you need and how often you need them. This information is not intended to replace advice given to you by your health care provider. Make sure you discuss any questions you have with your health care provider. Document Released: 10/01/2015 Document Revised: 05/24/2016 Document Reviewed: 07/06/2015 Elsevier Interactive Patient Education  2017 [place] Prevention in the Home Falls can cause injuries. They can happen to people of all ages. There are many things you can do to make your home safe and to help prevent falls. What can I do on the outside of my home? Regularly fix the edges of walkways and driveways and fix any cracks. Remove anything that might make you trip as you walk through a door, such as a raised step or threshold. Trim any bushes or trees on the path to your home. Use bright outdoor lighting. Clear any walking paths of anything that might make someone trip, such as rocks or tools. Regularly check to see if handrails are loose or broken. Make sure that both sides of any steps have handrails. Any raised decks and porches should have guardrails on the edges. Have any leaves, snow, or ice cleared regularly. Use sand or salt on walking paths during winter. Clean up any spills in your garage right away. This includes oil or grease spills. What can I do in the bathroom? Use night lights. Install grab bars by the toilet and in the tub and shower. Do not use towel bars as grab bars. Use non-skid mats or decals in the tub or shower. If you need to sit down in the shower, use a plastic, non-slip stool. Keep the floor dry. Clean up any water that spills on the floor as soon as it happens. Remove soap buildup in the tub or shower regularly. Attach bath mats securely with double-sided  non-slip rug tape. Do not have throw rugs and other things on the floor that can make you trip. What can I do in the bedroom? Use night lights. Make sure that you have a light by your bed that is easy to reach. Do not use any sheets or blankets that are too big for your bed. They should not hang down onto the floor. Have a firm chair that has side arms. You can use this for support while you get dressed. Do not have throw rugs and other things on the floor that can make you trip. What can I do in the kitchen? Clean up any spills right away. Avoid walking on wet floors. Keep items that you use a lot in easy-to-reach places. If you need to reach something above you, use a strong step stool that has a grab bar. Keep electrical cords out of the way. Do not use floor polish or wax that makes floors slippery. If you must use wax, use non-skid floor wax. Do not have throw rugs and other things on the floor that can make you trip. What can I do with my stairs? Do not leave any items on the stairs. Make sure that there are handrails on both sides of the stairs and use them. Fix handrails that are broken or loose. Make sure that handrails are as long as the stairways. Check any carpeting to make sure that it is firmly attached  to the stairs. Fix any carpet that is loose or worn. Avoid having throw rugs at the top or bottom of the stairs. If you do have throw rugs, attach them to the floor with carpet tape. Make sure that you have a light switch at the top of the stairs and the bottom of the stairs. If you do not have them, ask someone to add them for you. What else can I do to help prevent falls? Wear shoes that: Do not have high heels. Have rubber bottoms. Are comfortable and fit you well. Are closed at the toe. Do not wear sandals. If you use a stepladder: Make sure that it is fully opened. Do not climb a closed stepladder. Make sure that both sides of the stepladder are locked into place. Ask  someone to hold it for you, if possible. Clearly mark and make sure that you can see: Any grab bars or handrails. First and last steps. Where the edge of each step is. Use tools that help you move around (mobility aids) if they are needed. These include: Canes. Walkers. Scooters. Crutches. Turn on the lights when you go into a dark area. Replace any light bulbs as soon as they burn out. Set up your furniture so you have a clear path. Avoid moving your furniture around. If any of your floors are uneven, fix them. If there are any pets around you, be aware of where they are. Review your medicines with your doctor. Some medicines can make you feel dizzy. This can increase your chance of falling. Ask your doctor what other things that you can do to help prevent falls. This information is not intended to replace advice given to you by your health care provider. Make sure you discuss any questions you have with your health care provider. Document Released: 07/01/2009 Document Revised: 02/10/2016 Document Reviewed: 10/09/2014 Elsevier Interactive Patient Education  2017 [employer].

## 2022-03-20 ENCOUNTER — Telehealth: Payer: Self-pay | Admitting: Internal Medicine

## 2022-03-20 NOTE — Telephone Encounter (Signed)
Copied from [place] [phone]. Topic: General - Other >> Mar 20, 2022 11:19 AM Chapman Fitch wrote: Reason for CRM: Pt received an email that it was time for a colonoscopy / pt stated she believes she had an colonoscopy on 2.21.23 with Dr. Marius Ditch and was advised that another may have been requested/ please advise

## 2022-03-20 NOTE — Telephone Encounter (Signed)
Called pt let her know that we do have her result from 11/08/21. She verbalized understanding.  KP

## 2022-04-20 DIAGNOSIS — D225 Melanocytic nevi of trunk: Secondary | ICD-10-CM | POA: Diagnosis not present

## 2022-04-20 DIAGNOSIS — D2271 Melanocytic nevi of right lower limb, including hip: Secondary | ICD-10-CM | POA: Diagnosis not present

## 2022-04-20 DIAGNOSIS — D2272 Melanocytic nevi of left lower limb, including hip: Secondary | ICD-10-CM | POA: Diagnosis not present

## 2022-04-20 DIAGNOSIS — D2261 Melanocytic nevi of right upper limb, including shoulder: Secondary | ICD-10-CM | POA: Diagnosis not present

## 2022-04-20 DIAGNOSIS — D2262 Melanocytic nevi of left upper limb, including shoulder: Secondary | ICD-10-CM | POA: Diagnosis not present

## 2022-04-20 DIAGNOSIS — L821 Other seborrheic keratosis: Secondary | ICD-10-CM | POA: Diagnosis not present

## 2022-06-12 ENCOUNTER — Other Ambulatory Visit: Payer: Self-pay | Admitting: Internal Medicine

## 2022-06-13 NOTE — Telephone Encounter (Signed)
Requested Prescriptions  Pending Prescriptions Disp Refills  . fluticasone (FLONASE) 50 MCG/ACT nasal spray [Pharmacy Med Name: FLUTICASONE PROPIONATE 50 MCG/ACT Suspension] 48 g 1    Sig: USE 2 SPRAYS IN EACH NOSTRIL EVERY DAY     Ear, Nose, and Throat: Nasal Preparations - Corticosteroids Passed - 06/12/2022  4:22 PM      Passed - Valid encounter within last 12 months    Recent Outpatient Visits          3 months ago Annual physical exam   [place] [facility], Jesse Sans, MD   6 months ago Pain of left lower extremity   [place] [facility] at [facility], Jesse Sans, MD   8 months ago Muscle spasm of back   [facility] at [facility], Jesse Sans, MD   1 year ago Leg pain, posterior, right   [facility] at [facility], Jesse Sans, MD   2 years ago Tendonitis of shoulder, left   [facility] at [facility], Jesse Sans, MD      Future Appointments            In 8 months [facility], Jesse Sans, MD [place] and [facility] at [facility], [facility]

## 2022-06-29 DIAGNOSIS — M8588 Other specified disorders of bone density and structure, other site: Secondary | ICD-10-CM | POA: Diagnosis not present

## 2022-06-29 DIAGNOSIS — M810 Age-related osteoporosis without current pathological fracture: Secondary | ICD-10-CM | POA: Diagnosis not present

## 2022-06-29 LAB — HM DEXA SCAN

## 2022-07-17 ENCOUNTER — Encounter (INDEPENDENT_AMBULATORY_CARE_PROVIDER_SITE_OTHER): Payer: Self-pay

## 2022-07-27 DIAGNOSIS — H524 Presbyopia: Secondary | ICD-10-CM | POA: Diagnosis not present

## 2022-08-02 ENCOUNTER — Other Ambulatory Visit: Payer: Self-pay

## 2022-08-02 DIAGNOSIS — Z1331 Encounter for screening for depression: Secondary | ICD-10-CM | POA: Diagnosis not present

## 2022-08-02 DIAGNOSIS — Z1231 Encounter for screening mammogram for malignant neoplasm of breast: Secondary | ICD-10-CM

## 2022-08-02 DIAGNOSIS — N898 Other specified noninflammatory disorders of vagina: Secondary | ICD-10-CM | POA: Diagnosis not present

## 2022-08-02 DIAGNOSIS — Z01419 Encounter for gynecological examination (general) (routine) without abnormal findings: Secondary | ICD-10-CM | POA: Diagnosis not present

## 2022-08-15 ENCOUNTER — Ambulatory Visit (INDEPENDENT_AMBULATORY_CARE_PROVIDER_SITE_OTHER): Payer: Medicare HMO | Admitting: Internal Medicine

## 2022-08-15 ENCOUNTER — Encounter: Payer: Self-pay | Admitting: Internal Medicine

## 2022-08-15 VITALS — BP 104/66 | HR 68 | Ht 71.0 in | Wt 214.0 lb

## 2022-08-15 DIAGNOSIS — Z23 Encounter for immunization: Secondary | ICD-10-CM | POA: Diagnosis not present

## 2022-08-15 DIAGNOSIS — R42 Dizziness and giddiness: Secondary | ICD-10-CM

## 2022-08-15 DIAGNOSIS — M62830 Muscle spasm of back: Secondary | ICD-10-CM | POA: Diagnosis not present

## 2022-08-15 MED ORDER — BACLOFEN 10 MG PO TABS: 10.0000 mg | ORAL_TABLET | Freq: Two times a day (BID) | ORAL | 0 refills | Status: DC

## 2022-08-15 MED ORDER — MECLIZINE HCL 25 MG PO TABS: ORAL_TABLET | ORAL | 0 refills | Status: DC

## 2022-08-15 NOTE — Patient Instructions (Signed)
Take Meloxicam daily  Use Baclofen up to three times a day as needed - monitor for sedation

## 2022-08-15 NOTE — Progress Notes (Signed)
Date:  08/15/2022   Name:  Christine Ray   DOB:  06/14/1956   MRN:  [id number]   Chief Complaint: Abdominal Pain (Left side/), Knee Pain, and Dizziness (Takes meclizine- helps some but still feeling off balance)  Back Pain This is a new problem. The current episode started in the past 7 days (X3 weeks). The problem occurs intermittently. The problem has been gradually worsening since onset. The pain is present in the sacro-iliac. The quality of the pain is described as aching Dorma Russell). The pain radiates to the left knee. The pain is at a severity of 6/10. The pain is mild. The symptoms are aggravated by lying down and sitting. Stiffness is present At night. Associated symptoms include leg pain. Pertinent negatives include no chest pain, fever, numbness or weakness. She has tried NSAIDs and walking for the symptoms. The treatment provided mild relief.    Lab Results  Component Value Date   NA 140 02/14/2022   K 4.8 02/14/2022   CO2 25 02/14/2022   GLUCOSE 77 02/14/2022   BUN 15 02/14/2022   CREATININE 0.88 02/14/2022   CALCIUM 9.0 02/14/2022   EGFR 72 02/14/2022   GFRNONAA >60 11/30/2019   Lab Results  Component Value Date   CHOL 196 02/14/2022   HDL 58 02/14/2022   LDLCALC 124 (H) 02/14/2022   TRIG 79 02/14/2022   CHOLHDL 3.4 02/14/2022   Lab Results  Component Value Date   TSH 1.160 02/14/2022   No results found for: "HGBA1C" Lab Results  Component Value Date   WBC 5.7 02/14/2022   HGB 13.5 02/14/2022   HCT 40.7 02/14/2022   MCV 83 02/14/2022   PLT 191 02/14/2022   Lab Results  Component Value Date   ALT 15 02/14/2022   AST 16 02/14/2022   ALKPHOS 64 02/14/2022   BILITOT 0.4 02/14/2022   Lab Results  Component Value Date   VD25OH 35.6 02/14/2022     Review of Systems  Constitutional:  Negative for chills, fatigue and fever.  Respiratory:  Negative for chest tightness and shortness of breath.   Cardiovascular:  Negative for chest pain.   Gastrointestinal:  Negative for abdominal distention.  Genitourinary:  Negative for difficulty urinating.  Musculoskeletal:  Positive for back pain. Negative for joint swelling and myalgias.  Neurological:  Positive for dizziness. Negative for weakness and numbness.    Patient Active Problem List   Diagnosis Date Noted   History of colonic polyps    Polyp of sigmoid colon    Tubular adenoma of colon 10/11/2021   Muscle spasm of back 10/11/2021   Chronic pain of left knee 04/06/2017   Hyperlipidemia 04/26/2016   Vitamin D deficiency 04/26/2016   BPPV (benign paroxysmal positional vertigo) 04/26/2016   Varicose veins of both lower extremities 04/26/2016   GERD (gastroesophageal reflux disease) 01/04/2015   Osteoporosis 01/04/2015    Allergies  Allergen Reactions   Amoxicillin Hives   Hydrocodone Other (See Comments)    headache   Tizanidine Hcl Other (See Comments)    Itching, headache   Oxycodone Hcl Rash    Past Surgical History:  Procedure Laterality Date   COLONOSCOPY WITH PROPOFOL N/A 11/13/2016   Procedure: COLONOSCOPY WITH PROPOFOL;  Surgeon: Lollie Sails, MD;  Location[facility] END[facility]COPY;  Service: Endoscopy;  Laterality: N/A;   COLONOSCOPY WITH PROPOFOL N/A 11/08/2021   Procedure: COLONOSCOPY WITH PROPOFOL;  Surgeon: Lin Landsman, MD;  Location: [facility] ENDOSCOPY;  Service: Gastroenterology;  Laterality: N/A;  Social History   Tobacco Use   Smoking status: Former    Types: Cigarettes    Quit date: 10/14/1975    Years since quitting: 46.8   Smokeless tobacco: Never  Vaping Use   Vaping Use: Never used  Substance Use Topics   Alcohol use: No   Drug use: No     Medication list has been reviewed and updated.  Current Meds  Medication Sig   Ascorbic Acid (VITAMIN C) 500 MG CAPS Take 1,000 mg by mouth.   aspirin EC 81 MG tablet Take 81 mg by mouth daily as needed (learned that it is good to take everyday).   Cholecalciferol (VITAMIN D) 2000 units  tablet Take 2,000 Units by mouth daily.   fluticasone (FLONASE) 50 MCG/ACT nasal spray USE 2 SPRAYS IN EACH NOSTRIL EVERY DAY   ibandronate (BONIVA) 150 MG tablet Take 1 tablet by mouth every 30 (thirty) days.   meloxicam (MOBIC) 15 MG tablet Take 1 tablet (15 mg total) by mouth daily.   omeprazole (PRILOSEC) 40 MG capsule Take 1 capsule by mouth daily as needed.   simvastatin (ZOCOR) 10 MG tablet Take 10 mg by mouth at bedtime.   solifenacin (VESICARE) 5 MG tablet Take 5 mg by mouth daily.   timolol (TIMOPTIC) 0.5 % ophthalmic solution Place 1 drop into both eyes in the morning.   [DISCONTINUED] baclofen (LIORESAL) 10 MG tablet Take 1 tablet (10 mg total) by mouth 2 (two) times daily.   [DISCONTINUED] meclizine (ANTIVERT) 25 MG tablet TAKE 1 TABLET THREE TIMES DAILY AS NEEDED FOR DIZZINESS       08/15/2022   10:29 AM 02/14/2022    9:47 AM 11/17/2021    1:26 PM 10/11/2021    1:56 PM  GAD 7 : Generalized Anxiety Score  Nervous, Anxious, on Edge 0 0 0 0  Control/stop worrying 0 0 0 0  Worry too much - different things 0 0 0 0  Trouble relaxing 0 0 0 0  Restless 0 0 0 0  Easily annoyed or irritable 0 0 0 0  Afraid - awful might happen 0 0 0 0  Total GAD 7 Score 0 0 0 0  Anxiety Difficulty Not difficult at all Not difficult at all  Not difficult at all       08/15/2022   10:29 AM 03/15/2022    9:21 AM 02/14/2022    9:46 AM  Depression screen PHQ 2/9  Decreased Interest 0 0 0  Down, Depressed, Hopeless 0 0 0  PHQ - 2 Score 0 0 0  Altered sleeping 0  0  Tired, decreased energy 0  0  Change in appetite 0  0  Feeling bad or failure about yourself  0  0  Trouble concentrating 0  0  Moving slowly or fidgety/restless 0  0  Suicidal thoughts 0  0  PHQ-9 Score 0  0  Difficult doing work/chores Somewhat difficult Not difficult at all Not difficult at all    BP Readings from Last 3 Encounters:  08/15/22 104/66  02/14/22 104/76  11/17/21 112/78    Physical Exam Vitals and nursing  note reviewed.  Constitutional:      General: She is not in acute distress.    Appearance: She is well-developed.  HENT:     Head: Normocephalic and atraumatic.  Pulmonary:     Effort: Pulmonary effort is normal. No respiratory distress.  Musculoskeletal:     Lumbar back: Positive left straight leg raise test. Negative  right straight leg raise test.     Right hip: Normal.     Left hip: Decreased range of motion.     Right knee: Normal.     Left knee: Normal.     Right lower leg: No edema.     Left lower leg: No edema.  Skin:    General: Skin is warm and dry.     Findings: No rash.  Neurological:     Mental Status: She is alert and oriented to person, place, and time.  Psychiatric:        Mood and Affect: Mood normal.        Behavior: Behavior normal.     Wt Readings from Last 3 Encounters:  08/15/22 214 lb (97.1 kg)  02/14/22 214 lb (97.1 kg)  11/17/21 209 lb (94.8 kg)    BP 104/66   Pulse 68   Ht _0  (1.803 m)   Wt 214 lb (97.1 kg)   SpO2 97%   BMI 29.85 kg/m   Assessment and Plan: 1. Muscle spasm of back With mild sciatica. Recommend Mobic daily, baclofen tid and heat. - baclofen (LIORESAL) 10 MG tablet; Take 1 tablet (10 mg total) by mouth 2 (two) times daily.  Dispense: 180 each; Refill: 0  2. Vertigo Recurrent symptoms - will refill Meclizine - meclizine (ANTIVERT) 25 MG tablet; TAKE 1 TABLET THREE TIMES DAILY AS NEEDED FOR DIZZINESS  Dispense: 90 tablet; Refill: 0   Partially dictated using Editor, commissioning. Any errors are unintentional.  Halina Maidens, MD [facility]  08/15/2022

## 2022-10-01 ENCOUNTER — Other Ambulatory Visit: Payer: Self-pay | Admitting: Internal Medicine

## 2022-10-01 DIAGNOSIS — M62830 Muscle spasm of back: Secondary | ICD-10-CM

## 2022-10-02 ENCOUNTER — Ambulatory Visit
Admission: RE | Admit: 2022-10-02 | Discharge: 2022-10-02 | Disposition: A | Discharge status: Home / Self Care | Payer: Medicare HMO | Source: Ambulatory Visit | Attending: Obstetrics and Gynecology | Admitting: Obstetrics and Gynecology

## 2022-10-02 DIAGNOSIS — Z1231 Encounter for screening mammogram for malignant neoplasm of breast: Secondary | ICD-10-CM | POA: Diagnosis not present

## 2022-10-30 ENCOUNTER — Other Ambulatory Visit: Payer: Self-pay | Admitting: Internal Medicine

## 2022-10-30 DIAGNOSIS — M62830 Muscle spasm of back: Secondary | ICD-10-CM

## 2022-11-01 ENCOUNTER — Encounter: Payer: Self-pay | Admitting: Internal Medicine

## 2022-11-01 ENCOUNTER — Ambulatory Visit (INDEPENDENT_AMBULATORY_CARE_PROVIDER_SITE_OTHER): Payer: Medicare HMO | Admitting: Internal Medicine

## 2022-11-01 VITALS — BP 136/85 | HR 83 | Ht 71.0 in | Wt 212.0 lb

## 2022-11-01 DIAGNOSIS — R21 Rash and other nonspecific skin eruption: Secondary | ICD-10-CM

## 2022-11-01 DIAGNOSIS — R030 Elevated blood-pressure reading, without diagnosis of hypertension: Secondary | ICD-10-CM | POA: Diagnosis not present

## 2022-11-01 DIAGNOSIS — K0889 Other specified disorders of teeth and supporting structures: Secondary | ICD-10-CM | POA: Diagnosis not present

## 2022-11-01 MED ORDER — HYDROCORTISONE 0.5 % EX CREA: 1.0000 | TOPICAL_CREAM | Freq: Two times a day (BID) | CUTANEOUS | 0 refills | Status: DC

## 2022-11-01 NOTE — Progress Notes (Signed)
Date:  11/01/2022   Name:  Christine Ray   DOB:  04/16/1956   MRN:  [id number]   Chief Complaint: Hypertension and Rash (Rash on leg close to waist. Started Monday- using lotion, & benadryl. Feels like rash is spreading. Itching but not painful.)  Rash This is a new problem. The affected locations include the torso. Pertinent negatives include no fatigue, fever or shortness of breath.   Dental pain - seeing the dentist on Monday for infected tooth.    HTN - started taking BP yesterday and it was high.  Multiple readings  150/90's.  She feels well other than tooth pain.  No HA, dizziness, chest pain or shortness of breath. She has been taking some cold and sinus medications otc.  Lab Results  Component Value Date   NA 140 02/14/2022   K 4.8 02/14/2022   CO2 25 02/14/2022   GLUCOSE 77 02/14/2022   BUN 15 02/14/2022   CREATININE 0.88 02/14/2022   CALCIUM 9.0 02/14/2022   EGFR 72 02/14/2022   GFRNONAA >60 11/30/2019   Lab Results  Component Value Date   CHOL 196 02/14/2022   HDL 58 02/14/2022   LDLCALC 124 (H) 02/14/2022   TRIG 79 02/14/2022   CHOLHDL 3.4 02/14/2022   Lab Results  Component Value Date   TSH 1.160 02/14/2022   No results found for: "HGBA1C" Lab Results  Component Value Date   WBC 5.7 02/14/2022   HGB 13.5 02/14/2022   HCT 40.7 02/14/2022   MCV 83 02/14/2022   PLT 191 02/14/2022   Lab Results  Component Value Date   ALT 15 02/14/2022   AST 16 02/14/2022   ALKPHOS 64 02/14/2022   BILITOT 0.4 02/14/2022   Lab Results  Component Value Date   VD25OH 35.6 02/14/2022     Review of Systems  Constitutional:  Negative for chills, fatigue and fever.  HENT:  Positive for dental problem and sinus pressure.   Respiratory:  Negative for choking, chest tightness, shortness of breath and wheezing.   Cardiovascular:  Negative for chest pain, palpitations and leg swelling.  Skin:  Positive for color change and rash.  Neurological:  Negative for  dizziness, light-headedness and headaches.  Psychiatric/Behavioral:  Negative for dysphoric mood and sleep disturbance. The patient is nervous/anxious.     Patient Active Problem List   Diagnosis Date Noted   History of colonic polyps    Polyp of sigmoid colon    Tubular adenoma of colon 10/11/2021   Muscle spasm of back 10/11/2021   Chronic pain of left knee 04/06/2017   Hyperlipidemia 04/26/2016   Vitamin D deficiency 04/26/2016   BPPV (benign paroxysmal positional vertigo) 04/26/2016   Varicose veins of both lower extremities 04/26/2016   GERD (gastroesophageal reflux disease) 01/04/2015   Osteoporosis 01/04/2015    Allergies  Allergen Reactions   Amoxicillin Hives   Hydrocodone Other (See Comments)    headache   Tizanidine Hcl Other (See Comments)    Itching, headache   Oxycodone Hcl Rash    Past Surgical History:  Procedure Laterality Date   COLONOSCOPY WITH PROPOFOL N/A 11/13/2016   Procedure: COLONOSCOPY WITH PROPOFOL;  Surgeon: Lollie Sails, MD;  Location[facility] ENDOSCOPY;  Service: Endoscopy;  Laterality: N/A;   COLONOSCOPY WITH PROPOFOL N/A 11/08/2021   Procedure: COLONOSCOPY WITH PROPOFOL;  Surgeon: Lin Landsman, MD;  Location[facility] ENDOSCOPY;  Service: Gastroenterology;  Laterality: N/A;    Social History   Tobacco Use   Smoking  status: Former    Types: Cigarettes    Quit date: 10/14/1975    Years since quitting: 47.0   Smokeless tobacco: Never  Vaping Use   Vaping Use: Never used  Substance Use Topics   Alcohol use: No   Drug use: No     Medication list has been reviewed and updated.  Current Meds  Medication Sig   hydrocortisone cream 0.5 % Apply 1 Application topically 2 (two) times daily.       11/01/2022    1:41 PM 08/15/2022   10:29 AM 02/14/2022    9:47 AM 11/17/2021    1:26 PM  GAD 7 : Generalized Anxiety Score  Nervous, Anxious, on Edge 1 0 0 0  Control/stop worrying 1 0 0 0  Worry too much - different things 1 0 0 0   Trouble relaxing 0 0 0 0  Restless 0 0 0 0  Easily annoyed or irritable 0 0 0 0  Afraid - awful might happen 0 0 0 0  Total GAD 7 Score 3 0 0 0  Anxiety Difficulty Not difficult at all Not difficult at all Not difficult at all        11/01/2022    1:41 PM 08/15/2022   10:29 AM 03/15/2022    9:21 AM  Depression screen PHQ 2/9  Decreased Interest 0 0 0  Down, Depressed, Hopeless 0 0 0  PHQ - 2 Score 0 0 0  Altered sleeping 1 0   Tired, decreased energy 0 0   Change in appetite 0 0   Feeling bad or failure about yourself  0 0   Trouble concentrating 0 0   Moving slowly or fidgety/restless 0 0   Suicidal thoughts 0 0   PHQ-9 Score 1 0   Difficult doing work/chores Not difficult at all Somewhat difficult Not difficult at all    BP Readings from Last 3 Encounters:  11/01/22 136/85  08/15/22 104/66  02/14/22 104/76    Physical Exam Vitals and nursing note reviewed.  Constitutional:      General: She is not in acute distress.    Appearance: She is well-developed.  HENT:     Head: Normocephalic and atraumatic.  Cardiovascular:     Rate and Rhythm: Normal rate and regular rhythm.  Pulmonary:     Effort: Pulmonary effort is normal. No respiratory distress.     Breath sounds: No wheezing or rhonchi.  Musculoskeletal:     Cervical back: Normal range of motion.     Right lower leg: No edema.     Left lower leg: No edema.  Lymphadenopathy:     Cervical: No cervical adenopathy.  Skin:    General: Skin is warm and dry.     Findings: No rash.          Comments: Skin dry and red but no lesions or pain  Neurological:     General: No focal deficit present.     Mental Status: She is alert and oriented to person, place, and time.  Psychiatric:        Mood and Affect: Mood normal.        Behavior: Behavior normal.     Wt Readings from Last 3 Encounters:  11/01/22 212 lb (96.2 kg)  08/15/22 214 lb (97.1 kg)  02/14/22 214 lb (97.1 kg)    BP 136/85 (BP Location: Left  Arm, Cuff Size: Large)   Pulse 83   Ht 5' 11"$  (1.803 m)   Wt  212 lb (96.2 kg)   SpO2 95%   BMI 29.57 kg/m   Assessment and Plan: Problem List Items Addressed This Visit   None Visit Diagnoses     Rash and nonspecific skin eruption    -  Primary   doubt Shingles suspect dry skin and local excoriations   Relevant Medications   hydrocortisone cream 0.5 %   Elevated blood pressure reading       improving with rest rec: low sodium, avoid sinus meds  monitor at home once daily - goal < 140/90   Pain, dental       this is being addressed next week suspect ongoing discomfort is contributing to elevated BP        Partially dictated using Editor, commissioning. Any errors are unintentional.  Halina Maidens, MD [facility]  11/01/2022

## 2022-11-01 NOTE — Patient Instructions (Addendum)
Avoid otc medications that have "sinus" or "D" in the name.  Avoid large amounts of caffeine.  Avoid salt and salty foods.  Boswellia, tumeric are both good for joint pains

## 2022-11-28 ENCOUNTER — Other Ambulatory Visit: Payer: Self-pay | Admitting: Internal Medicine

## 2022-11-28 DIAGNOSIS — R42 Dizziness and giddiness: Secondary | ICD-10-CM

## 2022-11-28 NOTE — Telephone Encounter (Signed)
Requested medication (s) are due for refill today- yes  Requested medication (s) are on the active medication list -yes  Future visit scheduled -yes  Last refill: 08/15/22 #90  Notes to clinic: non delegated Rx  Requested Prescriptions  Pending Prescriptions Disp Refills   meclizine (ANTIVERT) 25 MG tablet [Pharmacy Med Name: MECLIZINE HYDROCHLORIDE 25 MG Tablet] 30 tablet     Sig: TAKE 1 TABLET THREE TIMES DAILY AS NEEDED FOR DIZZINESS     Not Delegated - Gastroenterology: Antiemetics Failed - 11/28/2022  8:00 AM      Failed - This refill cannot be delegated      Passed - Valid encounter within last 6 months    Recent Outpatient Visits           3 weeks ago Rash and nonspecific skin eruption   [place] at [facility], Jesse Sans, MD   3 months ago Need for immunization against influenza   [place] at [place], Jesse Sans, MD   9 months ago Annual physical exam   [place] at [facility], Jesse Sans, MD   1 year ago Pain of left lower extremity   [facility] [place], Jesse Sans, MD   1 year ago Muscle spasm of back   [place] at [place], Jesse Sans, MD       Future Appointments             In 2 months [facility] Melia Jesse Sans, MD [place] at [facility]               Requested Prescriptions  Pending Prescriptions Disp Refills   meclizine (ANTIVERT) 25 MG tablet [Pharmacy Med Name: [place] 25 MG Tablet] 30 tablet     Sig: TAKE 1 TABLET THREE TIMES DAILY AS NEEDED FOR DIZZINESS     Not Delegated - Gastroenterology: Antiemetics Failed - 11/28/2022  8:00 AM      Failed - This refill cannot be delegated      Passed - Valid encounter within last 6 months    Recent Outpatient Visits            3 weeks ago Rash and nonspecific skin eruption   [place] at [facility], Jesse Sans, MD   3 months ago Need for immunization against influenza   [place] at [facility], Jesse Sans, MD   9 months ago Annual physical exam   [place] at [facility], Jesse Sans, MD   1 year ago Pain of left lower extremity   [place] [facility] [place] [facility], Jesse Sans, MD   1 year ago Muscle spasm of back   [place] at [facility], Jesse Sans, MD       Future Appointments             In 2 months Army Melia, Jesse Sans, MD [place] at [facility]

## 2023-01-15 ENCOUNTER — Other Ambulatory Visit: Payer: Self-pay | Admitting: Internal Medicine

## 2023-01-22 DIAGNOSIS — H401132 Primary open-angle glaucoma, bilateral, moderate stage: Secondary | ICD-10-CM | POA: Diagnosis not present

## 2023-02-09 ENCOUNTER — Encounter: Payer: Self-pay | Admitting: Internal Medicine

## 2023-02-09 ENCOUNTER — Ambulatory Visit
Admission: RE | Admit: 2023-02-09 | Discharge: 2023-02-09 | Disposition: A | Discharge status: Home / Self Care | Payer: Medicare HMO | Attending: Internal Medicine | Admitting: Internal Medicine

## 2023-02-09 ENCOUNTER — Ambulatory Visit (INDEPENDENT_AMBULATORY_CARE_PROVIDER_SITE_OTHER): Payer: Medicare HMO | Admitting: Internal Medicine

## 2023-02-09 ENCOUNTER — Ambulatory Visit
Admission: RE | Admit: 2023-02-09 | Discharge: 2023-02-09 | Disposition: A | Discharge status: Home / Self Care | Payer: Medicare HMO | Source: Ambulatory Visit | Attending: Internal Medicine | Admitting: Internal Medicine

## 2023-02-09 VITALS — BP 122/78 | HR 80 | Ht 71.0 in | Wt 211.0 lb

## 2023-02-09 DIAGNOSIS — J0100 Acute maxillary sinusitis, unspecified: Secondary | ICD-10-CM

## 2023-02-09 DIAGNOSIS — M5432 Sciatica, left side: Secondary | ICD-10-CM

## 2023-02-09 DIAGNOSIS — M545 Low back pain, unspecified: Secondary | ICD-10-CM | POA: Diagnosis not present

## 2023-02-09 MED ORDER — CYCLOBENZAPRINE HCL 10 MG PO TABS: 10.0000 mg | ORAL_TABLET | Freq: Every day | ORAL | 0 refills | Status: DC

## 2023-02-09 MED ORDER — AZITHROMYCIN 250 MG PO TABS: ORAL_TABLET | ORAL | 0 refills | Status: AC

## 2023-02-09 NOTE — Progress Notes (Signed)
Date:  02/09/2023   Name:  Christine Ray   DOB:  May 07, 1956   MRN:  [id number]   Chief Complaint: Sinusitis (Had tooth pulled, but dentist says it is sinus.) and Back Pain (Radiating down L) leg)  Sinusitis This is a new problem. The current episode started more than 1 month ago (ongoing dental pain without cause - dentist thinks it is sinus). The problem is unchanged. There has been no fever. Associated symptoms include congestion, headaches and sinus pressure. Pertinent negatives include no chills or shortness of breath. Treatments tried: flonase spray intermittently.  Back Pain This is a new problem. The current episode started more than 1 month ago. The problem occurs intermittently. The problem is unchanged. The pain is present in the lumbar spine. The quality of the pain is described as aching. The pain radiates to the left foot. The pain is mild. Associated symptoms include headaches. Pertinent negatives include no bladder incontinence, bowel incontinence, chest pain, fever, paresthesias, perianal numbness or weakness. She has tried NSAIDs for the symptoms. The treatment provided mild relief.    Lab Results  Component Value Date   NA 140 02/14/2022   K 4.8 02/14/2022   CO2 25 02/14/2022   GLUCOSE 77 02/14/2022   BUN 15 02/14/2022   CREATININE 0.88 02/14/2022   CALCIUM 9.0 02/14/2022   EGFR 72 02/14/2022   GFRNONAA >60 11/30/2019   Lab Results  Component Value Date   CHOL 196 02/14/2022   HDL 58 02/14/2022   LDLCALC 124 (H) 02/14/2022   TRIG 79 02/14/2022   CHOLHDL 3.4 02/14/2022   Lab Results  Component Value Date   TSH 1.160 02/14/2022   No results found for: "HGBA1C" Lab Results  Component Value Date   WBC 5.7 02/14/2022   HGB 13.5 02/14/2022   HCT 40.7 02/14/2022   MCV 83 02/14/2022   PLT 191 02/14/2022   Lab Results  Component Value Date   ALT 15 02/14/2022   AST 16 02/14/2022   ALKPHOS 64 02/14/2022   BILITOT 0.4 02/14/2022   Lab Results   Component Value Date   VD25OH 35.6 02/14/2022     Review of Systems  Constitutional:  Negative for chills, fatigue and fever.  HENT:  Positive for congestion and sinus pressure.   Respiratory:  Negative for chest tightness and shortness of breath.   Cardiovascular:  Negative for chest pain.  Gastrointestinal:  Negative for bowel incontinence.  Genitourinary:  Negative for bladder incontinence.  Musculoskeletal:  Positive for back pain. Negative for arthralgias, gait problem and myalgias.  Neurological:  Positive for headaches. Negative for weakness and paresthesias.    Patient Active Problem List   Diagnosis Date Noted   History of colonic polyps    Polyp of sigmoid colon    Tubular adenoma of colon 10/11/2021   Muscle spasm of back 10/11/2021   Chronic pain of left knee 04/06/2017   Hyperlipidemia 04/26/2016   Vitamin D deficiency 04/26/2016   BPPV (benign paroxysmal positional vertigo) 04/26/2016   Varicose veins of both lower extremities 04/26/2016   GERD (gastroesophageal reflux disease) 01/04/2015   Osteoporosis 01/04/2015    Allergies  Allergen Reactions   Amoxicillin Hives   Hydrocodone Other (See Comments)    headache   Tizanidine Hcl Other (See Comments)    Itching, headache   Oxycodone Hcl Rash    Past Surgical History:  Procedure Laterality Date   COLONOSCOPY WITH PROPOFOL N/A 11/13/2016   Procedure: COLONOSCOPY WITH PROPOFOL;  Surgeon:  Christena Deem, MD;  Location: [facility] ENDOSCOPY;  Service: Endoscopy;  Laterality: N/A;   COLONOSCOPY WITH PROPOFOL N/A 11/08/2021   Procedure: COLONOSCOPY WITH PROPOFOL;  Surgeon: Toney Reil, MD;  Location: [facility] ENDOSCOPY;  Service: Gastroenterology;  Laterality: N/A;    Social History   Tobacco Use   Smoking status: Former    Types: Cigarettes    Quit date: 10/14/1975    Years since quitting: 47.3   Smokeless tobacco: Never  Vaping Use   Vaping Use: Never used  Substance Use Topics   Alcohol use: No    Drug use: No     Medication list has been reviewed and updated.  Current Meds  Medication Sig   Ascorbic Acid (VITAMIN C) 500 MG CAPS Take 1,000 mg by mouth.   aspirin EC 81 MG tablet Take 81 mg by mouth daily as needed (learned that it is good to take everyday).   azithromycin (ZITHROMAX Z-PAK) 250 MG tablet UAD   baclofen (LIORESAL) 10 MG tablet TAKE 1 TABLET TWICE DAILY   Cholecalciferol (VITAMIN D) 2000 units tablet Take 2,000 Units by mouth daily.   cyclobenzaprine (FLEXERIL) 10 MG tablet Take 1 tablet (10 mg total) by mouth at bedtime.   fluticasone (FLONASE) 50 MCG/ACT nasal spray USE 2 SPRAYS IN EACH NOSTRIL EVERY DAY   hydrocortisone cream 0.5 % Apply 1 Application topically 2 (two) times daily.   ibandronate (BONIVA) 150 MG tablet Take 1 tablet by mouth every 30 (thirty) days.   meclizine (ANTIVERT) 25 MG tablet TAKE 1 TABLET THREE TIMES DAILY AS NEEDED FOR DIZZINESS   meloxicam (MOBIC) 15 MG tablet TAKE ONE TABLET BY MOUTH ONCE DAILY   omeprazole (PRILOSEC) 40 MG capsule Take 1 capsule by mouth daily as needed.   simvastatin (ZOCOR) 10 MG tablet Take 10 mg by mouth at bedtime.   solifenacin (VESICARE) 5 MG tablet Take 5 mg by mouth daily.   timolol (TIMOPTIC) 0.5 % ophthalmic solution Place 1 drop into both eyes in the morning.       02/09/2023    9:59 AM 11/01/2022    1:41 PM 08/15/2022   10:29 AM 02/14/2022    9:47 AM  GAD 7 : Generalized Anxiety Score  Nervous, Anxious, on Edge 1 1 0 0  Control/stop worrying 0 1 0 0  Worry too much - different things 0 1 0 0  Trouble relaxing 0 0 0 0  Restless 0 0 0 0  Easily annoyed or irritable 1 0 0 0  Afraid - awful might happen 0 0 0 0  Total GAD 7 Score 2 3 0 0  Anxiety Difficulty Not difficult at all Not difficult at all Not difficult at all Not difficult at all       02/09/2023    9:59 AM 11/01/2022    1:41 PM 08/15/2022   10:29 AM  Depression screen PHQ 2/9  Decreased Interest 1 0 0  Down, Depressed, Hopeless 0 0 0   PHQ - 2 Score 1 0 0  Altered sleeping 0 1 0  Tired, decreased energy 1 0 0  Change in appetite 0 0 0  Feeling bad or failure about yourself  0 0 0  Trouble concentrating 0 0 0  Moving slowly or fidgety/restless 0 0 0  Suicidal thoughts 0 0 0  PHQ-9 Score 2 1 0  Difficult doing work/chores Not difficult at all Not difficult at all Somewhat difficult    BP Readings from Last 3 Encounters:  02/09/23 122/78  11/01/22 136/85  08/15/22 104/66    Physical Exam Vitals and nursing note reviewed.  Constitutional:      General: She is not in acute distress.    Appearance: Normal appearance. She is well-developed.  HENT:     Head: Normocephalic and atraumatic.     Right Ear: Tympanic membrane is retracted.     Left Ear: Tympanic membrane is retracted.     Nose:     Right Sinus: No maxillary sinus tenderness or frontal sinus tenderness.     Left Sinus: Maxillary sinus tenderness present. No frontal sinus tenderness.     Mouth/Throat:     Pharynx: Oropharynx is clear.  Cardiovascular:     Rate and Rhythm: Normal rate and regular rhythm.     Heart sounds: Normal heart sounds.  Pulmonary:     Effort: Pulmonary effort is normal. No respiratory distress.     Breath sounds: Normal breath sounds.  Musculoskeletal:     Cervical back: Normal range of motion.     Lumbar back: No spasms or tenderness. Normal range of motion. Positive left straight leg raise test.  Lymphadenopathy:     Cervical: No cervical adenopathy.  Skin:    General: Skin is warm and dry.     Findings: No rash.  Neurological:     Mental Status: She is alert and oriented to person, place, and time.     Sensory: Sensation is intact.     Motor: Motor function is intact.     Coordination: Coordination is intact.     Deep Tendon Reflexes:     Reflex Scores:      Patellar reflexes are 2+ on the right side and 2+ on the left side. Psychiatric:        Mood and Affect: Mood normal.        Behavior: Behavior normal.      Wt Readings from Last 3 Encounters:  02/09/23 211 lb (95.7 kg)  11/01/22 212 lb (96.2 kg)  08/15/22 214 lb (97.1 kg)    BP 122/78   Pulse 80   Ht 5\' 11"  (1.803 m)   Wt 211 lb (95.7 kg)   SpO2 96%   BMI 29.43 kg/m   Assessment and Plan:  Problem List Items Addressed This Visit   None Visit Diagnoses     Acute non-recurrent maxillary sinusitis    -  Primary   Coricidin HBP - take twice a day for at least a week. Use Flonase spray 2 sprays in each side of your nose every day.   Relevant Medications   azithromycin (ZITHROMAX Z-PAK) 250 MG tablet   Back pain with left-sided sciatica       Take Meloxicam every day. Take Flexeril 10 mg every bedtime Use heat on your lower back.   Relevant Medications   cyclobenzaprine (FLEXERIL) 10 MG tablet   Other Relevant Orders   DG Lumbar Spine Complete       No follow-ups on file.   Partially dictated using Dragon software, any errors are not intentional.  Reubin Milan, MD [facility] [place], [place]

## 2023-02-09 NOTE — Patient Instructions (Signed)
Coricidin HBP - take twice a day for at least a week. Use Flonase spray 2 sprays in each side of your nose every day.  Take Meloxicam every day. Take Flexeril 10 mg every bedtime Use heat on your lower back.

## 2023-02-14 ENCOUNTER — Ambulatory Visit: Payer: Self-pay | Admitting: *Deleted

## 2023-02-14 NOTE — Telephone Encounter (Signed)
Reason for Disposition  [1] Caller requesting NON-URGENT health information AND [2] PCP's office is the best resource    Back x ray report  Answer Assessment - Initial Assessment Questions 1. REASON FOR CALL or QUESTION: "What is your reason for calling today?" or "How can I best help you?" or "What question do you have that I can help answer?"     Pt. Called in inquiring about her back x ray result.  Dr [name] has not given an interpretation yet.   I let her know we would call her back once Dr. [name] looks at the result.    I let her know I would let Dr. [name] know she had called in inquiring about it.  Protocols used: Information Only Call - No Triage-A-AH

## 2023-02-14 NOTE — Telephone Encounter (Signed)
Results are not back yet. We will contact patient once results are in.  - Christine Ray

## 2023-02-14 NOTE — Telephone Encounter (Signed)
  Chief Complaint: Inquiring about her lumbar spine x ray result.   Symptoms: N/A Frequency: N/A Pertinent Negatives: Patient denies N/A Disposition: [] ED /[] Urgent Care (no appt availability in office) / [] Appointment(In office/virtual)/ []  [facility]/ [] Home Care/ [] Refused Recommended Disposition /[] [place] [facility]/ [x]  Follow-up with PCP Additional Notes: No interpretation has been released.   I let pt know someone would call her back once Dr. [name] sees it. I let her know I would let Dr. [name] know she had called in inquiring about the result. Pt agreeable to someone calling her back.

## 2023-02-20 ENCOUNTER — Encounter: Payer: Self-pay | Admitting: Internal Medicine

## 2023-02-20 ENCOUNTER — Ambulatory Visit (INDEPENDENT_AMBULATORY_CARE_PROVIDER_SITE_OTHER): Payer: Medicare HMO | Admitting: Internal Medicine

## 2023-02-20 VITALS — BP 122/70 | HR 64 | Ht 71.0 in | Wt 211.0 lb

## 2023-02-20 DIAGNOSIS — Z0000 Encounter for general adult medical examination without abnormal findings: Secondary | ICD-10-CM | POA: Diagnosis not present

## 2023-02-20 DIAGNOSIS — E782 Mixed hyperlipidemia: Secondary | ICD-10-CM

## 2023-02-20 DIAGNOSIS — I83813 Varicose veins of bilateral lower extremities with pain: Secondary | ICD-10-CM | POA: Diagnosis not present

## 2023-02-20 DIAGNOSIS — K219 Gastro-esophageal reflux disease without esophagitis: Secondary | ICD-10-CM

## 2023-02-20 DIAGNOSIS — M818 Other osteoporosis without current pathological fracture: Secondary | ICD-10-CM

## 2023-02-20 NOTE — Assessment & Plan Note (Addendum)
Tolerating statin medications.  No side effects noted. LDL is  Lab Results  Component Value Date   LDLCALC 124 (H) 02/14/2022  On simvastatin 10 mg. Consider higher dose or other medication when labs return.

## 2023-02-20 NOTE — Assessment & Plan Note (Signed)
On bisphosphonate. Last DEXA 2021

## 2023-02-20 NOTE — Assessment & Plan Note (Addendum)
Seen by VS in 2021 Would like to be seen again Continue compression stockings daily

## 2023-02-20 NOTE — Progress Notes (Signed)
Date:  02/20/2023   Name:  Christine Ray   DOB:  05-17-1956   MRN:  [id number]   Chief Complaint: Annual Exam Christine Ray is a 67 y.o. female who presents today for her Complete Annual Exam. She feels fairly well. She reports exercising some. She reports she is sleeping well. Breast complaints - none. Low back pain and sciatica improving with Mobic and Flexeril.  Mammogram: 09/2022 DEXA: 06/2022 - no change from 2021 Osteopenia Colonoscopy: 10/2021 repeat 5 yrs  Health Maintenance Due  Topic Date Due   Medicare Annual Wellness (AWV)  03/16/2023    Immunization History  Administered Date(s) Administered   COVID-19, mRNA, vaccine(Comirnaty)12 years and older 08/15/2022   Fluad Quad(high Dose 65+) 06/13/2019, 06/13/2021, 08/15/2022   Influenza Inj Mdck Quad With Preservative 06/20/2018   Influenza,inj,Quad PF,6+ Mos 10/27/2016, 06/22/2020   Influenza-Unspecified 10/27/2016, 06/20/2018   Moderna Sars-Covid-2 Vaccination 10/05/2019, 11/02/2019, 07/20/2020, 07/14/2021   PNEUMOCOCCAL CONJUGATE-20 02/14/2022   Pneumococcal Conjugate-13 01/29/2019   Tdap 06/11/2018   Zoster Recombinat (Shingrix) 05/15/2018, 07/19/2018    Gastroesophageal Reflux She complains of heartburn. She reports no abdominal pain, no chest pain, no coughing or no wheezing. This is a recurrent problem. The problem occurs rarely. Pertinent negatives include no fatigue. She has tried a PPI for the symptoms.  Hyperlipidemia This is a chronic problem. Recent lipid tests were reviewed and are high. Pertinent negatives include no chest pain or shortness of breath. Current antihyperlipidemic treatment includes statins. The current treatment provides moderate improvement of lipids.  OP - being treated with Boniva.  Recent Dexa showed osteopenia with no change from 2021.   Lab Results  Component Value Date   NA 140 02/14/2022   K 4.8 02/14/2022   CO2 25 02/14/2022   GLUCOSE 77 02/14/2022   BUN 15 02/14/2022    CREATININE 0.88 02/14/2022   CALCIUM 9.0 02/14/2022   EGFR 72 02/14/2022   GFRNONAA >60 11/30/2019   Lab Results  Component Value Date   CHOL 196 02/14/2022   HDL 58 02/14/2022   LDLCALC 124 (H) 02/14/2022   TRIG 79 02/14/2022   CHOLHDL 3.4 02/14/2022   Lab Results  Component Value Date   TSH 1.160 02/14/2022   No results found for: "HGBA1C" Lab Results  Component Value Date   WBC 5.7 02/14/2022   HGB 13.5 02/14/2022   HCT 40.7 02/14/2022   MCV 83 02/14/2022   PLT 191 02/14/2022   Lab Results  Component Value Date   ALT 15 02/14/2022   AST 16 02/14/2022   ALKPHOS 64 02/14/2022   BILITOT 0.4 02/14/2022   Lab Results  Component Value Date   VD25OH 35.6 02/14/2022     Review of Systems  Constitutional:  Negative for chills, fatigue and fever.  HENT:  Negative for congestion, hearing loss, tinnitus, trouble swallowing and voice change.   Eyes:  Negative for visual disturbance.  Respiratory:  Negative for cough, chest tightness, shortness of breath and wheezing.   Cardiovascular:  Negative for chest pain, palpitations and leg swelling.  Gastrointestinal:  Positive for heartburn. Negative for abdominal pain, constipation, diarrhea and vomiting.  Endocrine: Negative for polydipsia and polyuria.  Genitourinary:  Negative for dysuria, frequency, genital sores, vaginal bleeding and vaginal discharge.  Musculoskeletal:  Negative for arthralgias, gait problem and joint swelling.  Skin:  Negative for color change and rash.  Neurological:  Negative for dizziness, tremors, light-headedness and headaches.  Hematological:  Negative for adenopathy. Does not bruise/bleed easily.  Psychiatric/Behavioral:  Negative for dysphoric mood and sleep disturbance. The patient is not nervous/anxious.     Patient Active Problem List   Diagnosis Date Noted   History of colonic polyps    Polyp of sigmoid colon    Tubular adenoma of colon 10/11/2021   Muscle spasm of back 10/11/2021    Chronic pain of left knee 04/06/2017   Mixed hyperlipidemia 04/26/2016   Vitamin D deficiency 04/26/2016   BPPV (benign paroxysmal positional vertigo) 04/26/2016   Varicose veins of both lower extremities 04/26/2016   GERD (gastroesophageal reflux disease) 01/04/2015   Age-related osteoporosis without fracture 01/04/2015    Allergies  Allergen Reactions   Amoxicillin Hives   Hydrocodone Other (See Comments)    headache   Tizanidine Hcl Other (See Comments)    Itching, headache   Oxycodone Hcl Rash    Past Surgical History:  Procedure Laterality Date   COLONOSCOPY WITH PROPOFOL N/A 11/13/2016   Procedure: COLONOSCOPY WITH PROPOFOL;  Surgeon: Christena Deem, MD;  Location: [facility] ENDOSCOPY;  Service: Endoscopy;  Laterality: N/A;   COLONOSCOPY WITH PROPOFOL N/A 11/08/2021   Procedure: COLONOSCOPY WITH PROPOFOL;  Surgeon: Toney Reil, MD;  Location: [facility] ENDOSCOPY;  Service: Gastroenterology;  Laterality: N/A;    Social History   Tobacco Use   Smoking status: Former    Types: Cigarettes    Quit date: 10/14/1975    Years since quitting: 47.3   Smokeless tobacco: Never  Vaping Use   Vaping Use: Never used  Substance Use Topics   Alcohol use: No   Drug use: No     Medication list has been reviewed and updated.  Current Meds  Medication Sig   Ascorbic Acid (VITAMIN C) 500 MG CAPS Take 1,000 mg by mouth.   aspirin EC 81 MG tablet Take 81 mg by mouth daily as needed (learned that it is good to take everyday).   Cholecalciferol (VITAMIN D) 2000 units tablet Take 2,000 Units by mouth daily.   cyclobenzaprine (FLEXERIL) 10 MG tablet Take 1 tablet (10 mg total) by mouth at bedtime.   fluticasone (FLONASE) 50 MCG/ACT nasal spray USE 2 SPRAYS IN EACH NOSTRIL EVERY DAY   hydrocortisone cream 0.5 % Apply 1 Application topically 2 (two) times daily.   ibandronate (BONIVA) 150 MG tablet Take 1 tablet by mouth every 30 (thirty) days.   meloxicam (MOBIC) 15 MG tablet TAKE ONE  TABLET BY MOUTH ONCE DAILY   omeprazole (PRILOSEC) 40 MG capsule Take 1 capsule by mouth daily as needed.   simvastatin (ZOCOR) 10 MG tablet Take 10 mg by mouth at bedtime.   solifenacin (VESICARE) 5 MG tablet Take 5 mg by mouth daily.   timolol (TIMOPTIC) 0.5 % ophthalmic solution Place 1 drop into both eyes in the morning.       02/20/2023    9:33 AM 02/09/2023    9:59 AM 11/01/2022    1:41 PM 08/15/2022   10:29 AM  GAD 7 : Generalized Anxiety Score  Nervous, Anxious, on Edge 0 1 1 0  Control/stop worrying 0 0 1 0  Worry too much - different things 1 0 1 0  Trouble relaxing 0 0 0 0  Restless 0 0 0 0  Easily annoyed or irritable 0 1 0 0  Afraid - awful might happen 0 0 0 0  Total GAD 7 Score 1 2 3  0  Anxiety Difficulty Not difficult at all Not difficult at all Not difficult at all Not difficult at all  02/20/2023    9:33 AM 02/09/2023    9:59 AM 11/01/2022    1:41 PM  Depression screen PHQ 2/9  Decreased Interest 0 1 0  Down, Depressed, Hopeless 0 0 0  PHQ - 2 Score 0 1 0  Altered sleeping 0 0 1  Tired, decreased energy 1 1 0  Change in appetite 0 0 0  Feeling bad or failure about yourself  0 0 0  Trouble concentrating 0 0 0  Moving slowly or fidgety/restless 0 0 0  Suicidal thoughts 0 0 0  PHQ-9 Score 1 2 1   Difficult doing work/chores Not difficult at all Not difficult at all Not difficult at all    BP Readings from Last 3 Encounters:  02/20/23 122/70  02/09/23 122/78  11/01/22 136/85    Physical Exam Vitals and nursing note reviewed.  Constitutional:      General: She is not in acute distress.    Appearance: She is well-developed.  HENT:     Head: Normocephalic and atraumatic.     Right Ear: Tympanic membrane and ear canal normal.     Left Ear: Tympanic membrane and ear canal normal.     Nose:     Right Sinus: No maxillary sinus tenderness.     Left Sinus: No maxillary sinus tenderness.  Eyes:     General: No scleral icterus.       Right eye: No  discharge.        Left eye: No discharge.     Conjunctiva/sclera: Conjunctivae normal.  Neck:     Thyroid: No thyromegaly.     Vascular: No carotid bruit.  Cardiovascular:     Rate and Rhythm: Normal rate and regular rhythm.     Pulses: Normal pulses.     Heart sounds: Normal heart sounds.     Comments: Moderate varicose veins in both LEs Pulmonary:     Effort: Pulmonary effort is normal. No respiratory distress.     Breath sounds: No wheezing.  Chest:  Breasts:    Right: No mass, nipple discharge, skin change or tenderness.     Left: No mass, nipple discharge, skin change or tenderness.  Abdominal:     General: Bowel sounds are normal.     Palpations: Abdomen is soft.     Tenderness: There is no abdominal tenderness.  Musculoskeletal:     Cervical back: Normal range of motion. No erythema.     Right lower leg: No edema.     Left lower leg: No edema.  Lymphadenopathy:     Cervical: No cervical adenopathy.  Skin:    General: Skin is warm and dry.     Findings: No rash.  Neurological:     Mental Status: She is alert and oriented to person, place, and time.     Cranial Nerves: No cranial nerve deficit.     Sensory: No sensory deficit.     Deep Tendon Reflexes: Reflexes are normal and symmetric.  Psychiatric:        Attention and Perception: Attention normal.        Mood and Affect: Mood normal.     Wt Readings from Last 3 Encounters:  02/20/23 211 lb (95.7 kg)  02/09/23 211 lb (95.7 kg)  11/01/22 212 lb (96.2 kg)    BP 122/70   Pulse 64   Ht 5\' 11"  (1.803 m)   Wt 211 lb (95.7 kg)   SpO2 97%   BMI 29.43 kg/m   Assessment and Plan:  Problem List Items Addressed This Visit     Varicose veins of both lower extremities    Seen by VS in 2021 Would like to be seen again Continue compression stockings daily      Relevant Orders   Ambulatory referral to Vascular Surgery   Mixed hyperlipidemia    Tolerating statin medications.  No side effects noted. LDL is   Lab Results  Component Value Date   LDLCALC 124 (H) 02/14/2022  On simvastatin 10 mg. Consider higher dose or other medication when labs return.       Relevant Orders   Comprehensive metabolic panel   Lipid panel   TSH   GERD (gastroesophageal reflux disease)    Reflux symptoms are minimal on current therapy - omeprazole. No red flag signs such as weight loss, n/v, melena       Relevant Orders   CBC with Differential/Platelet   Age-related osteoporosis without fracture    On bisphosphonate. Last DEXA 2021      Relevant Orders   Comprehensive metabolic panel   Other Visit Diagnoses     Annual physical exam    -  Primary       Return in about 6 months (around 08/22/2023) for lipid.   Partially dictated using Dragon software, any errors are not intentional.  Reubin Milan, MD [facility] [place], [place]

## 2023-02-20 NOTE — Assessment & Plan Note (Signed)
Reflux symptoms are minimal on current therapy - omeprazole. No red flag signs such as weight loss, n/v, melena  

## 2023-02-21 LAB — COMPREHENSIVE METABOLIC PANEL
ALT: 10 IU/L (ref 0–32)
AST: 17 IU/L (ref 0–40)
Albumin/Globulin Ratio: 1.8 (ref 1.2–2.2)
Albumin: 4.3 g/dL (ref 3.9–4.9)
Alkaline Phosphatase: 69 IU/L (ref 44–121)
BUN/Creatinine Ratio: 20 (ref 12–28)
BUN: 18 mg/dL (ref 8–27)
Bilirubin Total: 0.5 mg/dL (ref 0.0–1.2)
CO2: 26 mmol/L (ref 20–29)
Calcium: 9.3 mg/dL (ref 8.7–10.3)
Chloride: 102 mmol/L (ref 96–106)
Creatinine, Ser: 0.88 mg/dL (ref 0.57–1.00)
Globulin, Total: 2.4 g/dL (ref 1.5–4.5)
Glucose: 76 mg/dL (ref 70–99)
Potassium: 4.6 mmol/L (ref 3.5–5.2)
Sodium: 139 mmol/L (ref 134–144)
Total Protein: 6.7 g/dL (ref 6.0–8.5)
eGFR: 71 mL/min/{1.73_m2} (ref >59)

## 2023-02-21 LAB — CBC WITH DIFFERENTIAL/PLATELET
Basophils Absolute: 0 10*3/uL (ref 0.0–0.2)
Basos: 0 %
EOS (ABSOLUTE): 0.1 10*3/uL (ref 0.0–0.4)
Eos: 1 %
Hematocrit: 43 % (ref 34.0–46.6)
Hemoglobin: 13.3 g/dL (ref 11.1–15.9)
Immature Grans (Abs): 0 10*3/uL (ref 0.0–0.1)
Immature Granulocytes: 0 %
Lymphocytes Absolute: 2.2 10*3/uL (ref 0.7–3.1)
Lymphs: 42 %
MCH: 26.5 pg — ABNORMAL LOW (ref 26.6–33.0)
MCHC: 30.9 g/dL — ABNORMAL LOW (ref 31.5–35.7)
MCV: 86 fL (ref 79–97)
Monocytes Absolute: 0.4 10*3/uL (ref 0.1–0.9)
Monocytes: 8 %
Neutrophils Absolute: 2.6 10*3/uL (ref 1.4–7.0)
Neutrophils: 49 %
Platelets: 176 10*3/uL (ref 150–450)
RBC: 5.01 x10E6/uL (ref 3.77–5.28)
RDW: 13.4 % (ref 11.7–15.4)
WBC: 5.3 10*3/uL (ref 3.4–10.8)

## 2023-02-21 LAB — TSH: TSH: 1.17 u[IU]/mL (ref 0.450–4.500)

## 2023-02-21 LAB — LIPID PANEL
Chol/HDL Ratio: 3 ratio (ref 0.0–4.4)
Cholesterol, Total: 184 mg/dL (ref 100–199)
HDL: 61 mg/dL (ref >39)
LDL Chol Calc (NIH): 109 mg/dL — ABNORMAL HIGH (ref 0–99)
Triglycerides: 74 mg/dL (ref 0–149)
VLDL Cholesterol Cal: 14 mg/dL (ref 5–40)

## 2023-03-13 ENCOUNTER — Other Ambulatory Visit: Payer: Self-pay | Admitting: Internal Medicine

## 2023-03-13 DIAGNOSIS — M5432 Sciatica, left side: Secondary | ICD-10-CM

## 2023-03-28 ENCOUNTER — Other Ambulatory Visit (INDEPENDENT_AMBULATORY_CARE_PROVIDER_SITE_OTHER): Payer: Self-pay | Admitting: Nurse Practitioner

## 2023-03-28 DIAGNOSIS — I83813 Varicose veins of bilateral lower extremities with pain: Secondary | ICD-10-CM

## 2023-04-10 ENCOUNTER — Encounter (INDEPENDENT_AMBULATORY_CARE_PROVIDER_SITE_OTHER): Payer: Medicare HMO

## 2023-04-10 ENCOUNTER — Encounter (INDEPENDENT_AMBULATORY_CARE_PROVIDER_SITE_OTHER): Payer: Medicare HMO | Admitting: Vascular Surgery

## 2023-04-19 ENCOUNTER — Other Ambulatory Visit: Payer: Self-pay

## 2023-04-19 DIAGNOSIS — M5432 Sciatica, left side: Secondary | ICD-10-CM

## 2023-04-19 MED ORDER — CYCLOBENZAPRINE HCL 10 MG PO TABS
10.0000 mg | ORAL_TABLET | Freq: Every day | ORAL | 0 refills | Status: DC
Start: 2023-04-19 — End: 2023-05-10

## 2023-04-20 DIAGNOSIS — H9313 Tinnitus, bilateral: Secondary | ICD-10-CM | POA: Diagnosis not present

## 2023-04-20 DIAGNOSIS — J019 Acute sinusitis, unspecified: Secondary | ICD-10-CM | POA: Diagnosis not present

## 2023-04-20 DIAGNOSIS — J310 Chronic rhinitis: Secondary | ICD-10-CM | POA: Diagnosis not present

## 2023-05-02 ENCOUNTER — Ambulatory Visit (INDEPENDENT_AMBULATORY_CARE_PROVIDER_SITE_OTHER): Payer: Medicare HMO | Admitting: Nurse Practitioner

## 2023-05-02 ENCOUNTER — Ambulatory Visit (INDEPENDENT_AMBULATORY_CARE_PROVIDER_SITE_OTHER): Payer: Medicare HMO

## 2023-05-02 ENCOUNTER — Encounter (INDEPENDENT_AMBULATORY_CARE_PROVIDER_SITE_OTHER): Payer: Self-pay | Admitting: Nurse Practitioner

## 2023-05-02 VITALS — BP 132/81 | HR 67 | Resp 16 | Wt 211.8 lb

## 2023-05-02 DIAGNOSIS — E782 Mixed hyperlipidemia: Secondary | ICD-10-CM | POA: Diagnosis not present

## 2023-05-02 DIAGNOSIS — I83813 Varicose veins of bilateral lower extremities with pain: Secondary | ICD-10-CM | POA: Diagnosis not present

## 2023-05-03 ENCOUNTER — Encounter (INDEPENDENT_AMBULATORY_CARE_PROVIDER_SITE_OTHER): Payer: Self-pay | Admitting: Nurse Practitioner

## 2023-05-03 NOTE — Progress Notes (Signed)
Subjective:    Patient ID: Christine Ray, female    DOB: [birth year], 67 y.o.   MRN: [id number] Chief Complaint  Patient presents with   New Patient (Initial Visit)    Ref Christine Ray consult symptomatic varicose veins    The patient is a 67 year old female who returns 3 years after initial visit.  Initially the patient did not have any notable venous reflux although she did have some spider varicosities and some stasis dermatitis at the time.  Since her last visit she has been very diligent with conservative therapy.  The patient continues to have pain in the lower extremities with dependency.  She notes that the left is much more focused on the right has recently began to be uncomfortable.  The pain is lessened with elevation. Graduated compression stockings, Class I (20-30 mmHg), have been worn but the stockings do not eliminate the leg pain. Over-the-counter analgesics do not improve the symptoms. The degree of discomfort continues to interfere with daily activities. The patient notes the pain in the legs is causing problems with daily exercise, at the workplace and even with household activities and maintenance such as standing in the kitchen preparing meals and doing dishes.   Venous ultrasound shows normal deep venous system, no evidence of acute or chronic DVT.  Superficial reflux is present in the left great saphenous vein extending from the saphenofemoral junction down to the proximal calf.  No evidence of superficial venous reflux is noted in the right lower extremity.    Review of Systems  Cardiovascular:  Positive for leg swelling.  All other systems reviewed and are negative.      Objective:   Physical Exam Vitals reviewed.  Cardiovascular:     Rate and Rhythm: Normal rate.     Pulses: Normal pulses.  Pulmonary:     Effort: Pulmonary effort is normal.  Musculoskeletal:        General: Tenderness present.     Left lower leg: Edema present.  Skin:    General: Skin is  warm and dry.  Neurological:     Mental Status: She is alert and oriented to person, place, and time.  Psychiatric:        Mood and Affect: Mood normal.        Behavior: Behavior normal.        Thought Content: Thought content normal.        Judgment: Judgment normal.     BP 132/81 (BP Location: Right Arm)   Pulse 67   Resp 16   Wt 211 lb 12.8 oz (96.1 kg)   BMI 29.54 kg/m   Past Medical History:  Diagnosis Date   Hyperlipidemia    Hyperlipidemia    Lateral epicondylitis of right elbow 01/04/2015    Social History   Socioeconomic History   Marital status: Married    Spouse name: Not on file   Number of children: 0   Years of education: Not on file   Highest education level: High school graduate  Occupational History   Not on file  Tobacco Use   Smoking status: Former    Current packs/day: 0.00    Types: Cigarettes    Quit date: 10/14/1975    Years since quitting: 47.5   Smokeless tobacco: Never  Vaping Use   Vaping status: Never Used  Substance and Sexual Activity   Alcohol use: No   Drug use: No   Sexual activity: Not on file  Other Topics Concern   Not  on file  Social History Narrative   Not on file   Social Determinants of Health   Financial Resource Strain: Low Risk  (03/15/2022)   Overall Financial Resource Strain (CARDIA)    Difficulty of Paying Living Expenses: Not hard at all  Food Insecurity: No Food Insecurity (03/15/2022)   Hunger Vital Sign    Worried About Running Out of Food in the Last Year: Never true    Ran Out of Food in the Last Year: Never true  Transportation Needs: No Transportation Needs (03/15/2022)   PRAPARE - Administrator, Civil Service (Medical): No    Lack of Transportation (Non-Medical): No  Physical Activity: Inactive (03/15/2022)   Exercise Vital Sign    Days of Exercise per Week: 0 days    Minutes of Exercise per Session: 0 min  Stress: No Stress Concern Present (03/15/2022)   [employer] of  Occupational Health - Occupational Stress Questionnaire    Feeling of Stress : Only a little  Social Connections: Moderately Integrated (03/15/2022)   Social Connection and Isolation Panel [NHANES]    Frequency of Communication with Friends and Family: More than three times a week    Frequency of Social Gatherings with Friends and Family: Once a week    Attends Religious Services: More than 4 times per year    Active Member of [employer] or Organizations: No    Attends Banker Meetings: Never    Marital Status: Married  Catering manager Violence: Not At Risk (03/15/2022)   Humiliation, Afraid, Rape, and Kick questionnaire    Fear of Current or Ex-Partner: No    Emotionally Abused: No    Physically Abused: No    Sexually Abused: No    Past Surgical History:  Procedure Laterality Date   COLONOSCOPY WITH PROPOFOL N/A 11/13/2016   Procedure: COLONOSCOPY WITH PROPOFOL;  Surgeon: Christena Deem, MD;  Location[facility] ENDOSCOPY;  Service: Endoscopy;  Laterality: N/A;   COLONOSCOPY WITH PROPOFOL N/A 11/08/2021   Procedure: COLONOSCOPY WITH PROPOFOL;  Surgeon: Toney Reil, MD;  Location: [facility] ENDOSCOPY;  Service: Gastroenterology;  Laterality: N/A;    Family History  Problem Relation Age of Onset   Breast cancer Mother 75   Prostate cancer Father    Throat cancer Brother     Allergies  Allergen Reactions   Amoxicillin Hives   Hydrocodone Other (See Comments)    headache   Tizanidine Hcl Other (See Comments)    Itching, headache   Oxycodone Hcl Rash       Latest Ref Rng & Units 02/20/2023   10:31 AM 02/14/2022   10:30 AM 06/28/2020   12:00 AM  CBC  WBC 3.4 - 10.8 x10E3/uL 5.3  5.7  6.8      Hemoglobin 11.1 - 15.9 g/dL 40.9  81.1  91.4      Hematocrit 34.0 - 46.6 % 43.0  40.7  42      Platelets 150 - 450 x10E3/uL 176  191  147         This result is from an external source.      CMP     Component Value Date/Time   NA 139 02/20/2023 1031   K 4.6 02/20/2023  1031   CL 102 02/20/2023 1031   CO2 26 02/20/2023 1031   GLUCOSE 76 02/20/2023 1031   GLUCOSE 91 11/30/2019 2310   BUN 18 02/20/2023 1031   CREATININE 0.88 02/20/2023 1031   CALCIUM 9.3 02/20/2023 1031  PROT 6.7 02/20/2023 1031   ALBUMIN 4.3 02/20/2023 1031   AST 17 02/20/2023 1031   ALT 10 02/20/2023 1031   ALKPHOS 69 02/20/2023 1031   BILITOT 0.5 02/20/2023 1031   EGFR 71 02/20/2023 1031   GFRNONAA >60 11/30/2019 2310     No results found.     Assessment & Plan:   1. Varicose veins of both lower extremities with pain Recommend  I have reviewed my previous  discussion with the patient regarding  varicose veins and why they cause symptoms. Patient will continue  wearing graduated compression stockings class 1 on a daily basis, beginning first thing in the morning and removing them in the evening.  The patient is CEAP [id number].  The patient has been wearing compression for more than 12 weeks with no or little benefit.  The patient has been exercising daily for more than 12 weeks. The patient has been elevating and taking OTC pain medications for more than 12 weeks.  None of these have have eliminated the pain related to the varicose veins and venous reflux or the discomfort regarding venous congestion.    In addition, behavioral modification including elevation during the day was again discussed and this will continue.  The patient has utilized over the counter pain medications and has been exercising.  However, at this time conservative therapy has not alleviated the patient's symptoms of leg pain and swelling  Recommend: laser ablation of the  left great saphenous veins to eliminate the symptoms of pain and swelling of the lower extremities caused by the severe superficial venous reflux disease.   2. Mixed hyperlipidemia Continue statin as ordered and reviewed, no changes at this time   Current Outpatient Medications on File Prior to Visit  Medication Sig Dispense  Refill   Ascorbic Acid (VITAMIN C) 500 MG CAPS Take 1,000 mg by mouth.     aspirin EC 81 MG tablet Take 81 mg by mouth daily as needed (learned that it is good to take everyday).     Cholecalciferol (VITAMIN D) 2000 units tablet Take 2,000 Units by mouth daily.     cyclobenzaprine (FLEXERIL) 10 MG tablet Take 1 tablet (10 mg total) by mouth at bedtime. 30 tablet 0   fluticasone (FLONASE) 50 MCG/ACT nasal spray USE 2 SPRAYS IN EACH NOSTRIL EVERY DAY 48 g 3   hydrocortisone cream 0.5 % Apply 1 Application topically 2 (two) times daily. 30 g 0   ibandronate (BONIVA) 150 MG tablet Take 1 tablet by mouth every 30 (thirty) days.     meloxicam (MOBIC) 15 MG tablet TAKE ONE TABLET BY MOUTH ONCE DAILY 90 tablet 1   omeprazole (PRILOSEC) 40 MG capsule Take 1 capsule by mouth daily as needed.     simvastatin (ZOCOR) 10 MG tablet Take 10 mg by mouth at bedtime.  11   solifenacin (VESICARE) 5 MG tablet Take 5 mg by mouth daily.     timolol (TIMOPTIC) 0.5 % ophthalmic solution Place 1 drop into both eyes in the morning.     No current facility-administered medications on file prior to visit.    There are no Patient Instructions on file for this visit. No follow-ups on file.   Georgiana Spinner, NP

## 2023-05-10 ENCOUNTER — Other Ambulatory Visit: Payer: Self-pay | Admitting: Internal Medicine

## 2023-05-10 DIAGNOSIS — M5432 Sciatica, left side: Secondary | ICD-10-CM

## 2023-05-15 ENCOUNTER — Ambulatory Visit (INDEPENDENT_AMBULATORY_CARE_PROVIDER_SITE_OTHER): Payer: Medicare HMO

## 2023-05-15 DIAGNOSIS — Z0000 Encounter for general adult medical examination without abnormal findings: Secondary | ICD-10-CM

## 2023-05-15 NOTE — Patient Instructions (Addendum)
Ms. Gopalakrishnan , Thank you for taking time to come for your Medicare Wellness Visit. I appreciate your ongoing commitment to your health goals. Please review the following plan we discussed and let me know if I can assist you in the future.   Referrals/Orders/Follow-Ups/Clinician Recommendations: none  This is a list of the screening recommended for you and due dates:  Health Maintenance  Topic Date Due   COVID-19 Vaccine (6 - 2023-24 season) 10/10/2022   Flu Shot  04/19/2023   Mammogram  10/03/2023   Medicare Annual Wellness Visit  05/14/2024   Colon Cancer Screening  11/08/2026   DTaP/Tdap/Td vaccine (2 - Td or Tdap) 06/11/2028   Pneumonia Vaccine  Completed   DEXA scan (bone density measurement)  Completed   Hepatitis C Screening  Completed   Zoster (Shingles) Vaccine  Completed   HPV Vaccine  Aged Out    Advanced directives: (ACP Link)Information on Advanced Care Planning can be found at [facility] Advance Health Care Directives Advance Health Care Directives ([email or web address]   Next Medicare Annual Wellness Visit scheduled for next year: Yes   05/20/24 @ 9:45 am by phone

## 2023-05-15 NOTE — Progress Notes (Signed)
Subjective:   Christine Ray is a 67 y.o. female who presents for Medicare Annual (Subsequent) preventive examination.  Visit Complete: Virtual  I connected with  Christine Ray on 05/15/23 by a audio enabled telemedicine application and verified that I am speaking with the correct person using two identifiers.  Patient Location: Home  Provider Location: Office/Clinic  I discussed the limitations of evaluation and management by telemedicine. The patient expressed understanding and agreed to proceed.  Vital Signs: Unable to obtain new vitals due to this being a telehealth visit.  Review of Systems     Cardiac Risk Factors include: advanced age (>33men, >53 women);dyslipidemia;obesity (BMI >30kg/m2)     Objective:    Today's Vitals   05/15/23 1034  PainSc: 4    There is no height or weight on file to calculate BMI.     05/15/2023   10:39 AM 03/15/2022    9:22 AM 11/08/2021    8:30 AM 03/14/2021    9:36 AM 03/10/2020    9:33 AM 11/30/2019   10:55 PM 03/27/2017    8:47 AM  Advanced Directives  Does Patient Have a Medical Advance Directive? No Yes No Yes Yes No No  Type of Special educational needs teacher of [place];Living will  Healthcare Power of [place];Living will Healthcare Power of [place];Living will    Copy of Healthcare Power of Attorney in Chart?  No - copy requested  No - copy requested No - copy requested    Would patient like information on creating a medical advance directive? No - Patient declined     No - Patient declined No - Patient declined    Current Medications (verified) Outpatient Encounter Medications as of 05/15/2023  Medication Sig   Ascorbic Acid (VITAMIN C) 500 MG CAPS Take 1,000 mg by mouth.   aspirin EC 81 MG tablet Take 81 mg by mouth daily as needed (learned that it is good to take everyday).   Cholecalciferol (VITAMIN D) 2000 units tablet Take 2,000 Units by mouth daily.   cyclobenzaprine (FLEXERIL) 10 MG tablet TAKE 1 TABLET AT  BEDTIME   fluticasone (FLONASE) 50 MCG/ACT nasal spray USE 2 SPRAYS IN EACH NOSTRIL EVERY DAY   hydrocortisone cream 0.5 % Apply 1 Application topically 2 (two) times daily.   ibandronate (BONIVA) 150 MG tablet Take 1 tablet by mouth every 30 (thirty) days.   meloxicam (MOBIC) 15 MG tablet TAKE ONE TABLET BY MOUTH ONCE DAILY   omeprazole (PRILOSEC) 40 MG capsule Take 1 capsule by mouth daily as needed.   simvastatin (ZOCOR) 10 MG tablet Take 10 mg by mouth at bedtime.   solifenacin (VESICARE) 5 MG tablet Take 5 mg by mouth daily.   timolol (TIMOPTIC) 0.5 % ophthalmic solution Place 1 drop into both eyes in the morning.   No facility-administered encounter medications on file as of 05/15/2023.    Allergies (verified) Amoxicillin, Hydrocodone, Tizanidine hcl, and Oxycodone hcl   History: Past Medical History:  Diagnosis Date   Hyperlipidemia    Hyperlipidemia    Lateral epicondylitis of right elbow 01/04/2015   Past Surgical History:  Procedure Laterality Date   COLONOSCOPY WITH PROPOFOL N/A 11/13/2016   Procedure: COLONOSCOPY WITH PROPOFOL;  Surgeon: Christena Deem, MD;  Location: [facility] ENDOSCOPY;  Service: Endoscopy;  Laterality: N/A;   COLONOSCOPY WITH PROPOFOL N/A 11/08/2021   Procedure: COLONOSCOPY WITH PROPOFOL;  Surgeon: Toney Reil, MD;  Location: [facility] ENDOSCOPY;  Service: Gastroenterology;  Laterality: N/A;   Family History  Problem Relation Age of Onset   Breast cancer Mother 17   Prostate cancer Father    Throat cancer Brother    Social History   Socioeconomic History   Marital status: Married    Spouse name: Not on file   Number of children: 0   Years of education: Not on file   Highest education level: High school graduate  Occupational History   Not on file  Tobacco Use   Smoking status: Former    Current packs/day: 0.00    Types: Cigarettes    Quit date: 10/14/1975    Years since quitting: 47.6   Smokeless tobacco: Never  Vaping Use   Vaping  status: Never Used  Substance and Sexual Activity   Alcohol use: No   Drug use: No   Sexual activity: Not on file  Other Topics Concern   Not on file  Social History Narrative   Not on file   Social Determinants of Health   Financial Resource Strain: Low Risk  (05/15/2023)   Overall Financial Resource Strain (CARDIA)    Difficulty of Paying Living Expenses: Not hard at all  Food Insecurity: No Food Insecurity (05/15/2023)   Hunger Vital Sign    Worried About Running Out of Food in the Last Year: Never true    Ran Out of Food in the Last Year: Never true  Transportation Needs: No Transportation Needs (05/15/2023)   PRAPARE - Administrator, Civil Service (Medical): No    Lack of Transportation (Non-Medical): No  Physical Activity: Insufficiently Active (05/15/2023)   Exercise Vital Sign    Days of Exercise per Week: 2 days    Minutes of Exercise per Session: 20 min  Stress: No Stress Concern Present (05/15/2023)   [employer] of Occupational Health - Occupational Stress Questionnaire    Feeling of Stress : Not at all  Social Connections: Moderately Integrated (05/15/2023)   Social Connection and Isolation Panel [NHANES]    Frequency of Communication with Friends and Family: More than three times a week    Frequency of Social Gatherings with Friends and Family: Three times a week    Attends Religious Services: More than 4 times per year    Active Member of Clubs or Organizations: No    Attends Banker Meetings: Never    Marital Status: Married    Tobacco Counseling Counseling given: Not Answered   Clinical Intake:  Pre-visit preparation completed: Yes  Pain : 0-10 Pain Score: 4  Pain Type: Chronic pain Pain Location: Leg Pain Orientation: Right, Left Pain Descriptors / Indicators: Aching Pain Relieving Factors: medicine, tylenol  Pain Relieving Factors: medicine, tylenol  Nutritional Risks: None Diabetes: No  How often do you need to  have someone help you when you read instructions, pamphlets, or other written materials from your doctor or pharmacy?: 1 - Never  Interpreter Needed?: No  Information entered by :: Kennedy Bucker, LPN   Activities of Daily Living    05/15/2023   10:40 AM  In your present state of health, do you have any difficulty performing the following activities:  Hearing? 0  Vision? 0  Difficulty concentrating or making decisions? 0  Walking or climbing stairs? 1  Comment if legs ache  Dressing or bathing? 0  Doing errands, shopping? 0  Preparing Food and eating ? N  Using the Toilet? N  In the past six months, have you accidently leaked urine? N  Do you have problems with loss of  bowel control? N  Managing your Medications? N  Managing your Finances? N  Housekeeping or managing your Housekeeping? N    Patient Care Team: Reubin Milan, MD as PCP - General (Internal Medicine) Schermerhorn, Ihor Austin, MD as Referring Physician (Obstetrics and Gynecology) Tedd Sias Marlana Salvage, MD as Physician Assistant (Endocrinology)  Indicate any recent Medical Services you may have received from other than [facility] providers in the past year (date may be approximate).     Assessment:   This is a routine wellness examination for Jenalee.  Hearing/Vision screen Hearing Screening - Comments:: No aids Vision Screening - Comments:: Readers- [facility]   Dietary issues and exercise activities discussed:     Goals Addressed             This Visit's Progress    DIET - EAT MORE FRUITS AND VEGETABLES         Depression Screen    05/15/2023   10:37 AM 02/20/2023    9:33 AM 02/09/2023    9:59 AM 11/01/2022    1:41 PM 08/15/2022   10:29 AM 03/15/2022    9:21 AM 02/14/2022    9:46 AM  PHQ 2/9 Scores  PHQ - 2 Score 0 0 1 0 0 0 0  PHQ- 9 Score 0 1 2 1  0  0    Fall Risk    05/15/2023   10:40 AM 02/20/2023    9:33 AM 02/09/2023    9:58 AM 11/01/2022    1:41 PM 08/15/2022   10:30 AM  Fall Risk   Falls in  the past year? 0 0 0 0 0  Number falls in past yr: 0 0 0 0 0  Injury with Fall? 0 0 0 0 0  Risk for fall due to : No Fall Risks No Fall Risks No Fall Risks No Fall Risks No Fall Risks  Follow up Falls prevention discussed;Falls evaluation completed Falls evaluation completed Falls evaluation completed Falls evaluation completed Falls evaluation completed    MEDICARE RISK AT HOME: Medicare Risk at Home Any stairs in or around the home?: Yes If so, are there any without handrails?: No Home free of loose throw rugs in walkways, pet beds, electrical cords, etc?: Yes Adequate lighting in your home to reduce risk of falls?: Yes Life alert?: No Use of a cane, walker or w/c?: No Grab bars in the bathroom?: No Shower chair or bench in shower?: Yes Elevated toilet seat or a handicapped toilet?: No  TIMED UP AND GO:  Was the test performed?  No    Cognitive Function:        05/15/2023   10:41 AM  6CIT Screen  What Year? 0 points  What month? 0 points  What time? 0 points  Count back from 20 0 points  Months in reverse 0 points  Repeat phrase 0 points  Total Score 0 points    Immunizations Immunization History  Administered Date(s) Administered   COVID-19, mRNA, vaccine(Comirnaty)12 years and older 08/15/2022   Fluad Quad(high Dose 65+) 06/13/2019, 06/13/2021, 08/15/2022   Influenza Inj Mdck Quad With Preservative 06/20/2018   Influenza,inj,Quad PF,6+ Mos 10/27/2016, 06/22/2020   Influenza-Unspecified 10/27/2016, 06/20/2018   Moderna Sars-Covid-2 Vaccination 10/05/2019, 11/02/2019, 07/20/2020, 07/14/2021   PNEUMOCOCCAL CONJUGATE-20 02/14/2022   Pneumococcal Conjugate-13 01/29/2019   Tdap 06/11/2018   Zoster Recombinant(Shingrix) 05/15/2018, 07/19/2018    TDAP status: Up to date  Flu Vaccine status: Up to date  Pneumococcal vaccine status: Up to date  Covid-19 vaccine status: Information  provided on how to obtain vaccines.   Qualifies for Shingles Vaccine? Yes    Zostavax completed No   Shingrix Completed?: Yes  Screening Tests Health Maintenance  Topic Date Due   COVID-19 Vaccine (6 - 2023-24 season) 10/10/2022   INFLUENZA VACCINE  04/19/2023   MAMMOGRAM  10/03/2023   Medicare Annual Wellness (AWV)  05/14/2024   Colonoscopy  11/08/2026   DTaP/Tdap/Td (2 - Td or Tdap) 06/11/2028   Pneumonia Vaccine 72+ Years old  Completed   DEXA SCAN  Completed   Hepatitis C Screening  Completed   Zoster Vaccines- Shingrix  Completed   HPV VACCINES  Aged Out    Health Maintenance  Health Maintenance Due  Topic Date Due   COVID-19 Vaccine (6 - 2023-24 season) 10/10/2022   INFLUENZA VACCINE  04/19/2023    Colorectal cancer screening: Type of screening: Colonoscopy. Completed 11/08/21. Repeat every 5 years  Mammogram status: Completed 10/02/22. Repeat every year  Bone Density status: Completed 06/29/22. Results reflect: Bone density results: OSTEOPENIA. Repeat every 5 years.  Lung Cancer Screening: (Low Dose CT Chest recommended if Age 63-80 years, 20 pack-year currently smoking OR have quit w/in 15years.) does not qualify.    Additional Screening:  Hepatitis C Screening: does qualify; Completed 02/14/22  Vision Screening: Recommended annual ophthalmology exams for early detection of glaucoma and other disorders of the eye. Is the patient up to date with their annual eye exam?  Yes  Who is the provider or what is the name of the office in which the patient attends annual eye exams? [facility] If pt is not established with a provider, would they like to be referred to a provider to establish care? No .   Dental Screening: Recommended annual dental exams for proper oral hygiene   Community Resource Referral / Chronic Care Management: CRR required this visit?  No   CCM required this visit?  No     Plan:     I have personally reviewed and noted the following in the patient's chart:   Medical and social history Use of alcohol, tobacco or  illicit drugs  Current medications and supplements including opioid prescriptions. Patient is not currently taking opioid prescriptions. Functional ability and status Nutritional status Physical activity Advanced directives List of other physicians Hospitalizations, surgeries, and ER visits in previous 12 months Vitals Screenings to include cognitive, depression, and falls Referrals and appointments  In addition, I have reviewed and discussed with patient certain preventive protocols, quality metrics, and best practice recommendations. A written personalized care plan for preventive services as well as general preventive health recommendations were provided to patient.     Hal Hope, LPN   1/61/0960   After Visit Summary: (MyChart) Due to this being a telephonic visit, the after visit summary with patients personalized plan was offered to patient via MyChart   Nurse Notes: none

## 2023-05-28 ENCOUNTER — Telehealth (INDEPENDENT_AMBULATORY_CARE_PROVIDER_SITE_OTHER): Payer: Self-pay | Admitting: Nurse Practitioner

## 2023-05-28 NOTE — Telephone Encounter (Signed)
Patient called and LVM to inquire about her laser being scheduled, please call and advise

## 2023-05-29 NOTE — Telephone Encounter (Signed)
Spoke with pt and let her know that we are now scheduling pts from the month of June and as soon as we can we will get her called back to shcedule and it will probably be the first of the year.  She states understanding

## 2023-06-01 DIAGNOSIS — J019 Acute sinusitis, unspecified: Secondary | ICD-10-CM | POA: Diagnosis not present

## 2023-06-01 DIAGNOSIS — H6981 Other specified disorders of Eustachian tube, right ear: Secondary | ICD-10-CM | POA: Diagnosis not present

## 2023-06-20 ENCOUNTER — Other Ambulatory Visit (INDEPENDENT_AMBULATORY_CARE_PROVIDER_SITE_OTHER): Payer: Self-pay

## 2023-06-22 MED ORDER — ALPRAZOLAM 0.5 MG PO TABS
ORAL_TABLET | ORAL | 0 refills | Status: DC
Start: 2023-06-22 — End: 2023-08-08

## 2023-07-27 ENCOUNTER — Encounter: Payer: Self-pay | Admitting: Family Medicine

## 2023-07-27 ENCOUNTER — Ambulatory Visit (INDEPENDENT_AMBULATORY_CARE_PROVIDER_SITE_OTHER): Payer: Medicare HMO | Admitting: Family Medicine

## 2023-07-27 VITALS — BP 118/82 | HR 81 | Ht 71.0 in | Wt 211.0 lb

## 2023-07-27 DIAGNOSIS — M47817 Spondylosis without myelopathy or radiculopathy, lumbosacral region: Secondary | ICD-10-CM

## 2023-07-27 DIAGNOSIS — M25561 Pain in right knee: Secondary | ICD-10-CM | POA: Diagnosis not present

## 2023-07-27 MED ORDER — DICLOFENAC SODIUM 50 MG PO TBEC: 50.0000 mg | DELAYED_RELEASE_TABLET | Freq: Two times a day (BID) | ORAL | 0 refills | Status: DC | PRN

## 2023-07-27 NOTE — Progress Notes (Signed)
Primary Care / Sports Medicine Office Visit  Patient Information:  Patient ID: Christine Ray, female DOB: September 21, 1955 Age: 67 y.o. MRN: [id number]   Christine Ray is a pleasant 67 y.o. female presenting with the following:  Chief Complaint  Patient presents with   Leg Pain    X 2 weeks ago, right leg,got better then worse,Back of leg, no injuries, runs down the right side lower back     Vitals:   07/27/23 1530  BP: 118/82  Pulse: 81  SpO2: 98%   Vitals:   07/27/23 1530  Weight: 211 lb (95.7 kg)  Height: 5\' 11"  (1.803 m)   Body mass index is 29.43 kg/m.  No results found.   Independent interpretation of notes and tests performed by another provider:   Lumbar xray from 02/09/2023: Osteopenia. Mild degenerative changes greatest at L5-S1 with mild neural foraminal narrowing. Preserved disc spaces  Procedures performed:   None  Pertinent History, Exam, Impression, and Recommendations:   Problem List Items Addressed This Visit       Musculoskeletal and Integument   Spondylosis without myelopathy or radiculopathy, lumbosacral region    Pertinent History: - Reports right leg pain for 2 days, worst yesterday. Kept leg elevated, took Tylenol and drank plenty of fluids. Pain in thigh/buttocks, not below knee. Throbbing pain, no numbness/tingling. - Recently put up Christmas trees and went up/down steps, which may have aggravated knee. - Has upcoming appointment on 08/21/2023 for varicose vein procedure on right leg.  Exam: - Tender to palpation over central low back and left paraspinal region. Positive axial loading on left. Negative modified SLR bilaterally. - Lumbar xray from 02/09/2023: Osteopenia. Mild degenerative changes greatest at L5-S1 with mild neural foraminal narrowing. Preserved disc spaces. - Lower extremity strength, sensation, and straight leg raise testing WNL bilaterally.  Impression: - Lumbar spondylosis (M47.817) Differential diagnoses  include lumbar strain, lumbar radiculopathy, and lumbar spinal stenosis.  Recommendations (Call this Plan): - Discussed pathophysiology of pain generator as osteoarthritis of knee causing compensatory hamstring and paraspinal muscle strain. - Prescribed diclofenac PO BID prn pain x4 weeks (stop prior meloxicam rx). Cyclobenzaprine 10mg  PO TID prn muscle spasms. Take scheduled anti-inflammatory until symptoms fully improved, then prn. Muscle relaxer for breakthrough muscle tightness. - Reviewed HEP for core/LE strengthening. Perform 2x/week x4 weeks. - Advised to avoid heavy lifting/carrying and limit stairs in short-term. - RTC for worsening symptoms or no improvement after 2-4 weeks trial of medications and HEP. Will consider right knee xrays at that time if no improvement.      Relevant Medications   diclofenac (VOLTAREN) 50 MG EC tablet     Other   Arthralgia of right knee - Primary    Pertinent History: - Reports right leg pain for 2 days, worst yesterday. Afraid it might be a blood clot. Kept leg elevated, took Tylenol and drank plenty of fluids. Pain in thigh/buttocks, not below knee. Throbbing pain, no numbness/tingling. - Recently put up Christmas trees and went up/down steps, which may have aggravated knee. - Has upcoming appointment on 08/21/2023 for varicose vein procedure on right leg.  Exam: - Right knee: Mild medial joint line tenderness. FROM intact. Negative patellar grind.  Impression: - Right knee osteoarthritis (M17.11) Differential diagnoses include meniscal tear, ligamentous injury, and patellofemoral pain syndrome.  Recommendations (Call this Plan): - Discussed pathophysiology of pain generator as osteoarthritis of knee causing compensatory hamstring and paraspinal muscle strain. - Prescribed diclofenac 50mg  PO BID prn pain PRN (  stop prior meloxicam rx). Cyclobenzaprine 10mg  PO TID prn muscle spasms. Take scheduled anti-inflammatory until symptoms fully improved,  then prn. Muscle relaxer for breakthrough muscle tightness. - Reviewed HEP for core/LE strengthening. Perform 2x/week x4 weeks. - Advised to avoid heavy lifting/carrying and limit stairs in short-term. - RTC for worsening symptoms or no improvement after 2-4 weeks trial of medications and HEP. Will consider right knee xrays at that time if no improvement.  Right Hamstring Tendinitis as associated condition  Pertinent History: - Reports right leg pain for 2 days, worst yesterday. Afraid it might be a blood clot. Kept leg elevated, took Tylenol and drank plenty of fluids. Pain in thigh/buttocks, not below knee. Throbbing pain, no numbness/tingling. - Recently put up Christmas trees and went up/down steps, which may have aggravated knee.  Exam: - Tender over hamstring tendons. Distally just posterolateral to right knee - Nontender Calf, negative Homan's  Impression: - Right hamstring tendinitis (W09.811) Differential diagnoses include hamstring strain and referred pain from lumbar spine.  See additional assessment(s) for plan details.      Relevant Medications   diclofenac (VOLTAREN) 50 MG EC tablet     Orders & Medications Medications:  Meds ordered this encounter  Medications   diclofenac (VOLTAREN) 50 MG EC tablet    Sig: Take 1 tablet (50 mg total) by mouth 2 (two) times daily as needed.    Dispense:  60 tablet    Refill:  0   No orders of the defined types were placed in this encounter.    No follow-ups on file.     Jerrol Banana, MD, [facility]   Primary Care Sports Medicine Primary Care and Sports Medicine at [facility]

## 2023-07-27 NOTE — Assessment & Plan Note (Addendum)
Pertinent History: - Reports right leg pain for 2 days, worst yesterday. Afraid it might be a blood clot. Kept leg elevated, took Tylenol and drank plenty of fluids. Pain in thigh/buttocks, not below knee. Throbbing pain, no numbness/tingling. - Recently put up Christmas trees and went up/down steps, which may have aggravated knee. - Has upcoming appointment on 08/21/2023 for varicose vein procedure on right leg.  Exam: - Right knee: Mild medial joint line tenderness. FROM intact. Negative patellar grind.  Impression: - Right knee osteoarthritis (M17.11) Differential diagnoses include meniscal tear, ligamentous injury, and patellofemoral pain syndrome.  Recommendations (Call this Plan): - Discussed pathophysiology of pain generator as osteoarthritis of knee causing compensatory hamstring and paraspinal muscle strain. - Prescribed diclofenac 50mg  PO BID prn pain PRN (stop prior meloxicam rx). Cyclobenzaprine 10mg  PO TID prn muscle spasms. Take scheduled anti-inflammatory until symptoms fully improved, then prn. Muscle relaxer for breakthrough muscle tightness. - Reviewed HEP for core/LE strengthening. Perform 2x/week x4 weeks. - Advised to avoid heavy lifting/carrying and limit stairs in short-term. - RTC for worsening symptoms or no improvement after 2-4 weeks trial of medications and HEP. Will consider right knee xrays at that time if no improvement.  Right Hamstring Tendinitis as associated condition  Pertinent History: - Reports right leg pain for 2 days, worst yesterday. Afraid it might be a blood clot. Kept leg elevated, took Tylenol and drank plenty of fluids. Pain in thigh/buttocks, not below knee. Throbbing pain, no numbness/tingling. - Recently put up Christmas trees and went up/down steps, which may have aggravated knee.  Exam: - Tender over hamstring tendons. Distally just posterolateral to right knee - Nontender Calf, negative Homan's  Impression: - Right hamstring  tendinitis (Z61.096) Differential diagnoses include hamstring strain and referred pain from lumbar spine.  See additional assessment(s) for plan details.

## 2023-07-27 NOTE — Patient Instructions (Addendum)
-   Take diclofenac twice daily and cyclobenzaprine 10mg  three times daily as needed for pain and muscle spasms. - Stop taking previous meloxicam prescription. - Continue medications as scheduled until symptoms fully improve. - Perform home exercises for core and leg strengthening twice weekly for 4 weeks. Avoid heavy lifting/carrying and limit stairs temporarily. - Follow-up if pain worsens or does not improve after 2-4 weeks of medications and exercises. Red flags: Worsening pain, numbness/tingling, or weakness in legs. Inability to walk. Severe back pain. Fever. Go to ER or call office immediately if these develop.

## 2023-07-27 NOTE — Assessment & Plan Note (Addendum)
Pertinent History: - Reports right leg pain for 2 days, worst yesterday. Kept leg elevated, took Tylenol and drank plenty of fluids. Pain in thigh/buttocks, not below knee. Throbbing pain, no numbness/tingling. - Recently put up Christmas trees and went up/down steps, which may have aggravated knee. - Has upcoming appointment on 08/21/2023 for varicose vein procedure on right leg.  Exam: - Tender to palpation over central low back and left paraspinal region. Positive axial loading on left. Negative modified SLR bilaterally. - Lumbar xray from 02/09/2023: Osteopenia. Mild degenerative changes greatest at L5-S1 with mild neural foraminal narrowing. Preserved disc spaces. - Lower extremity strength, sensation, and straight leg raise testing WNL bilaterally.  Impression: - Lumbar spondylosis (M47.817) Differential diagnoses include lumbar strain, lumbar radiculopathy, and lumbar spinal stenosis.  Recommendations (Call this Plan): - Discussed pathophysiology of pain generator as osteoarthritis of knee causing compensatory hamstring and paraspinal muscle strain. - Prescribed diclofenac PO BID prn pain x4 weeks (stop prior meloxicam rx). Cyclobenzaprine 10mg  PO TID prn muscle spasms. Take scheduled anti-inflammatory until symptoms fully improved, then prn. Muscle relaxer for breakthrough muscle tightness. - Reviewed HEP for core/LE strengthening. Perform 2x/week x4 weeks. - Advised to avoid heavy lifting/carrying and limit stairs in short-term. - RTC for worsening symptoms or no improvement after 2-4 weeks trial of medications and HEP. Will consider right knee xrays at that time if no improvement.

## 2023-07-30 DIAGNOSIS — H5212 Myopia, left eye: Secondary | ICD-10-CM | POA: Diagnosis not present

## 2023-07-30 DIAGNOSIS — Z0100 Encounter for examination of eyes and vision without abnormal findings: Secondary | ICD-10-CM | POA: Diagnosis not present

## 2023-08-06 ENCOUNTER — Other Ambulatory Visit: Payer: Self-pay | Admitting: Obstetrics and Gynecology

## 2023-08-06 DIAGNOSIS — N3281 Overactive bladder: Secondary | ICD-10-CM | POA: Diagnosis not present

## 2023-08-06 DIAGNOSIS — Z1231 Encounter for screening mammogram for malignant neoplasm of breast: Secondary | ICD-10-CM

## 2023-08-06 DIAGNOSIS — Z01411 Encounter for gynecological examination (general) (routine) with abnormal findings: Secondary | ICD-10-CM | POA: Diagnosis not present

## 2023-08-06 DIAGNOSIS — I10 Essential (primary) hypertension: Secondary | ICD-10-CM | POA: Diagnosis not present

## 2023-08-08 ENCOUNTER — Ambulatory Visit (INDEPENDENT_AMBULATORY_CARE_PROVIDER_SITE_OTHER): Payer: Medicare HMO | Admitting: Internal Medicine

## 2023-08-08 ENCOUNTER — Encounter: Payer: Self-pay | Admitting: Internal Medicine

## 2023-08-08 ENCOUNTER — Other Ambulatory Visit
Admission: RE | Admit: 2023-08-08 | Discharge: 2023-08-08 | Disposition: A | Discharge status: Home / Self Care | Payer: Medicare HMO | Attending: Internal Medicine | Admitting: Internal Medicine

## 2023-08-08 VITALS — BP 126/84 | HR 80 | Temp 97.5°F | Ht 71.0 in | Wt 215.0 lb

## 2023-08-08 DIAGNOSIS — I10 Essential (primary) hypertension: Secondary | ICD-10-CM | POA: Insufficient documentation

## 2023-08-08 DIAGNOSIS — E782 Mixed hyperlipidemia: Secondary | ICD-10-CM

## 2023-08-08 DIAGNOSIS — Z23 Encounter for immunization: Secondary | ICD-10-CM | POA: Diagnosis not present

## 2023-08-08 LAB — LIPID PANEL
Cholesterol: 196 mg/dL (ref 0–200)
HDL: 58 mg/dL (ref >40)
LDL Cholesterol: 120 mg/dL — ABNORMAL HIGH (ref 0–99)
Total CHOL/HDL Ratio: 3.4 {ratio}
Triglycerides: 89 mg/dL (ref <150)
VLDL: 18 mg/dL (ref 0–40)

## 2023-08-08 LAB — COMPREHENSIVE METABOLIC PANEL
ALT: 16 U/L (ref 0–44)
AST: 20 U/L (ref 15–41)
Albumin: 4.1 g/dL (ref 3.5–5.0)
Alkaline Phosphatase: 54 U/L (ref 38–126)
Anion gap: 8 (ref 5–15)
BUN: 16 mg/dL (ref 8–23)
CO2: 28 mmol/L (ref 22–32)
Calcium: 8.8 mg/dL — ABNORMAL LOW (ref 8.9–10.3)
Chloride: 102 mmol/L (ref 98–111)
Creatinine, Ser: 0.87 mg/dL (ref 0.44–1.00)
GFR, Estimated: >60 mL/min (ref >60)
Glucose, Bld: 85 mg/dL (ref 70–99)
Potassium: 3.7 mmol/L (ref 3.5–5.1)
Sodium: 138 mmol/L (ref 135–145)
Total Bilirubin: 0.4 mg/dL (ref <1.2)
Total Protein: 7.5 g/dL (ref 6.5–8.1)

## 2023-08-08 LAB — TSH: TSH: 1.296 u[IU]/mL (ref 0.350–4.500)

## 2023-08-08 MED ORDER — IRBESARTAN 150 MG PO TABS: 150.0000 mg | ORAL_TABLET | Freq: Every day | ORAL | 0 refills | Status: DC

## 2023-08-08 NOTE — Patient Instructions (Signed)
Goal BP is 130/80

## 2023-08-08 NOTE — Progress Notes (Signed)
Date:  08/08/2023   Name:  Christine Ray   DOB:  Nov 10, 1955   MRN:  [id number]   Chief Complaint: Hyperlipidemia  Hyperlipidemia This is a chronic problem. The problem is controlled. Pertinent negatives include no chest pain or shortness of breath. Current antihyperlipidemic treatment includes diet change, exercise and statins.  Hypertension This is a new (elevated at GYN last week) problem. The current episode started in the past 7 days. The problem is unchanged (continues to be elevated at home). Pertinent negatives include no chest pain, headaches, palpitations, peripheral edema or shortness of breath. There are no associated agents to hypertension. Risk factors for coronary artery disease include family history. Past treatments include nothing. There is no history of kidney disease, CAD/MI or CVA.    Review of Systems  Constitutional:  Negative for fatigue and unexpected weight change.  HENT:  Negative for nosebleeds.   Eyes:  Negative for visual disturbance.  Respiratory:  Negative for cough, chest tightness, shortness of breath and wheezing.   Cardiovascular:  Negative for chest pain, palpitations and leg swelling.  Gastrointestinal:  Negative for abdominal pain, constipation and diarrhea.  Neurological:  Negative for dizziness, weakness, light-headedness and headaches.     Lab Results  Component Value Date   NA 139 02/20/2023   K 4.6 02/20/2023   CO2 26 02/20/2023   GLUCOSE 76 02/20/2023   BUN 18 02/20/2023   CREATININE 0.88 02/20/2023   CALCIUM 9.3 02/20/2023   EGFR 71 02/20/2023   GFRNONAA >60 11/30/2019   Lab Results  Component Value Date   CHOL 184 02/20/2023   HDL 61 02/20/2023   LDLCALC 109 (H) 02/20/2023   TRIG 74 02/20/2023   CHOLHDL 3.0 02/20/2023   Lab Results  Component Value Date   TSH 1.170 02/20/2023   No results found for: "HGBA1C" Lab Results  Component Value Date   WBC 5.3 02/20/2023   HGB 13.3 02/20/2023   HCT 43.0 02/20/2023   MCV  86 02/20/2023   PLT 176 02/20/2023   Lab Results  Component Value Date   ALT 10 02/20/2023   AST 17 02/20/2023   ALKPHOS 69 02/20/2023   BILITOT 0.5 02/20/2023   Lab Results  Component Value Date   VD25OH 35.6 02/14/2022     Patient Active Problem List   Diagnosis Date Noted   Essential hypertension 08/08/2023   Spondylosis without myelopathy or radiculopathy, lumbosacral region 07/27/2023   Arthralgia of right knee 07/27/2023   History of colonic polyps    Polyp of sigmoid colon    Tubular adenoma of colon 10/11/2021   Muscle spasm of back 10/11/2021   Chronic pain of left knee 04/06/2017   Mixed hyperlipidemia 04/26/2016   Vitamin D deficiency 04/26/2016   BPPV (benign paroxysmal positional vertigo) 04/26/2016   Varicose veins of both lower extremities 04/26/2016   GERD (gastroesophageal reflux disease) 01/04/2015   Age-related osteoporosis without fracture 01/04/2015    Allergies  Allergen Reactions   Amoxicillin Hives   Hydrocodone Other (See Comments)    headache   Tizanidine Hcl Other (See Comments)    Itching, headache   Oxycodone Hcl Rash    Past Surgical History:  Procedure Laterality Date   COLONOSCOPY WITH PROPOFOL N/A 11/13/2016   Procedure: COLONOSCOPY WITH PROPOFOL;  Surgeon: Christena Deem, MD;  Location: [facility] ENDOSCOPY;  Service: Endoscopy;  Laterality: N/A;   COLONOSCOPY WITH PROPOFOL N/A 11/08/2021   Procedure: COLONOSCOPY WITH PROPOFOL;  Surgeon: Toney Reil, MD;  Location:  [facility] ENDOSCOPY;  Service: Gastroenterology;  Laterality: N/A;    Social History   Tobacco Use   Smoking status: Former    Current packs/day: 0.00    Types: Cigarettes    Quit date: 10/14/1975    Years since quitting: 47.8   Smokeless tobacco: Never  Vaping Use   Vaping status: Never Used  Substance Use Topics   Alcohol use: No   Drug use: No     Medication list has been reviewed and updated.  Current Meds  Medication Sig   Ascorbic Acid (VITAMIN C)  500 MG CAPS Take 1,000 mg by mouth.   aspirin EC 81 MG tablet Take 81 mg by mouth daily as needed (learned that it is good to take everyday).   azelastine (ASTELIN) 0.1 % nasal spray SMARTSIG:1-2 Spray(s) Both Nares Twice Daily   Cholecalciferol (VITAMIN D) 2000 units tablet Take 2,000 Units by mouth daily.   cyclobenzaprine (FLEXERIL) 10 MG tablet TAKE 1 TABLET AT BEDTIME   diclofenac (VOLTAREN) 50 MG EC tablet Take 1 tablet (50 mg total) by mouth 2 (two) times daily as needed.   fluticasone (FLONASE) 50 MCG/ACT nasal spray USE 2 SPRAYS IN EACH NOSTRIL EVERY DAY   hydrocortisone cream 0.5 % Apply 1 Application topically 2 (two) times daily.   ibandronate (BONIVA) 150 MG tablet Take 1 tablet by mouth every 30 (thirty) days.   irbesartan (AVAPRO) 150 MG tablet Take 1 tablet (150 mg total) by mouth daily.   omeprazole (PRILOSEC) 40 MG capsule Take 1 capsule by mouth daily as needed.   simvastatin (ZOCOR) 10 MG tablet Take 10 mg by mouth at bedtime.   solifenacin (VESICARE) 5 MG tablet Take 5 mg by mouth daily.   timolol (TIMOPTIC) 0.5 % ophthalmic solution Place 1 drop into both eyes in the morning.   [DISCONTINUED] ALPRAZolam (XANAX) 0.5 MG tablet Take 1 tab one hour before procedure and 1 tab when you arrive in office       08/08/2023    8:28 AM 07/27/2023    3:36 PM 02/20/2023    9:33 AM 02/09/2023    9:59 AM  GAD 7 : Generalized Anxiety Score  Nervous, Anxious, on Edge 0 0 0 1  Control/stop worrying 0 0 0 0  Worry too much - different things 0 0 1 0  Trouble relaxing 0 0 0 0  Restless 0 0 0 0  Easily annoyed or irritable 0 0 0 1  Afraid - awful might happen 0 0 0 0  Total GAD 7 Score 0 0 1 2  Anxiety Difficulty Not difficult at all Not difficult at all Not difficult at all Not difficult at all       08/08/2023    8:28 AM 07/27/2023    3:36 PM 05/15/2023   10:37 AM  Depression screen PHQ 2/9  Decreased Interest 0 0 0  Down, Depressed, Hopeless 0 0 0  PHQ - 2 Score 0 0 0   Altered sleeping 0 0 0  Tired, decreased energy 1 0 0  Change in appetite 0 0 0  Feeling bad or failure about yourself  0 0 0  Trouble concentrating 0 0 0  Moving slowly or fidgety/restless 0 0 0  Suicidal thoughts 0 0 0  PHQ-9 Score 1 0 0  Difficult doing work/chores Not difficult at all Not difficult at all Not difficult at all    BP Readings from Last 3 Encounters:  08/08/23 126/84  07/27/23 118/82  05/02/23 132/81  Physical Exam Vitals and nursing note reviewed.  Constitutional:      General: She is not in acute distress.    Appearance: She is well-developed.  HENT:     Head: Normocephalic and atraumatic.  Neck:     Vascular: No carotid bruit.  Cardiovascular:     Rate and Rhythm: Normal rate and regular rhythm.  Pulmonary:     Effort: Pulmonary effort is normal. No respiratory distress.     Breath sounds: No wheezing or rhonchi.  Musculoskeletal:     Cervical back: Normal range of motion.     Right lower leg: No edema.     Left lower leg: No edema.  Lymphadenopathy:     Cervical: No cervical adenopathy.  Skin:    General: Skin is warm and dry.     Findings: No rash.  Neurological:     General: No focal deficit present.     Mental Status: She is alert and oriented to person, place, and time.  Psychiatric:        Mood and Affect: Mood normal.        Behavior: Behavior normal.     Wt Readings from Last 3 Encounters:  08/08/23 215 lb (97.5 kg)  07/27/23 211 lb (95.7 kg)  05/02/23 211 lb 12.8 oz (96.1 kg)    BP 126/84 (BP Location: Left Arm, Cuff Size: Large)   Pulse 80   Temp (!) 97.5 F (36.4 C) (Oral)   Ht 5\' 11"  (1.803 m)   Wt 215 lb (97.5 kg)   SpO2 98%   BMI 29.99 kg/m   Assessment and Plan:  Problem List Items Addressed This Visit       Unprioritized   Mixed hyperlipidemia    LDL is  Lab Results  Component Value Date   LDLCALC 109 (H) 02/20/2023    Current regimen is simvastatin.  Tolerating medications well without issues.        Relevant Medications   irbesartan (AVAPRO) 150 MG tablet   Other Relevant Orders   Lipid panel   Essential hypertension - Primary    Will start Avapro daily. DASH diet, exercise, avoid sinus tablets Okay to continue Flonase Recently started Voltaren bid - could possibly be contributing.  She will use this PRN only Recheck in 4 weeks      Relevant Medications   irbesartan (AVAPRO) 150 MG tablet   Other Relevant Orders   Comprehensive metabolic panel   TSH   Other Visit Diagnoses     Need for influenza vaccination       Relevant Orders   Flu Vaccine Trivalent High Dose (Fluad) (Completed)       Return in about 4 weeks (around 09/05/2023) for HTN.    Reubin Milan, MD [facility] Primary Care and Sports Medicine [place]

## 2023-08-08 NOTE — Assessment & Plan Note (Signed)
LDL is  Lab Results  Component Value Date   LDLCALC 109 (H) 02/20/2023    Current regimen is simvastatin.  Tolerating medications well without issues.

## 2023-08-08 NOTE — Assessment & Plan Note (Signed)
Will start Avapro daily. DASH diet, exercise, avoid sinus tablets Okay to continue Flonase Recently started Voltaren bid - could possibly be contributing.  She will use this PRN only Recheck in 4 weeks

## 2023-08-15 ENCOUNTER — Ambulatory Visit: Payer: Medicare HMO | Admitting: Internal Medicine

## 2023-08-21 ENCOUNTER — Other Ambulatory Visit (INDEPENDENT_AMBULATORY_CARE_PROVIDER_SITE_OTHER): Payer: Medicare HMO | Admitting: Vascular Surgery

## 2023-08-27 ENCOUNTER — Encounter: Payer: Self-pay | Admitting: Internal Medicine

## 2023-08-27 ENCOUNTER — Ambulatory Visit (INDEPENDENT_AMBULATORY_CARE_PROVIDER_SITE_OTHER): Payer: Medicare HMO | Admitting: Internal Medicine

## 2023-08-27 VITALS — BP 118/72 | HR 71 | Ht 71.0 in | Wt 213.0 lb

## 2023-08-27 DIAGNOSIS — I10 Essential (primary) hypertension: Secondary | ICD-10-CM

## 2023-08-27 MED ORDER — IRBESARTAN 150 MG PO TABS: 150.0000 mg | ORAL_TABLET | Freq: Every day | ORAL | 1 refills | Status: DC

## 2023-08-27 NOTE — Progress Notes (Signed)
Date:  08/27/2023   Name:  Christine Ray   DOB:  12-Sep-1956   MRN:  [id number]   Chief Complaint: Hypertension  Hypertension This is a chronic problem. Pertinent negatives include no chest pain, headaches, palpitations or shortness of breath. Past treatments include angiotensin blockers (started 3 weeks ago).    Review of Systems  Constitutional:  Negative for fatigue and unexpected weight change.  HENT:  Negative for nosebleeds.   Eyes:  Negative for visual disturbance.  Respiratory:  Negative for cough, chest tightness, shortness of breath and wheezing.   Cardiovascular:  Negative for chest pain, palpitations and leg swelling.  Gastrointestinal:  Negative for abdominal pain, constipation and diarrhea.  Neurological:  Negative for dizziness, weakness, light-headedness and headaches.     Lab Results  Component Value Date   NA 138 08/08/2023   K 3.7 08/08/2023   CO2 28 08/08/2023   GLUCOSE 85 08/08/2023   BUN 16 08/08/2023   CREATININE 0.87 08/08/2023   CALCIUM 8.8 (L) 08/08/2023   EGFR 71 02/20/2023   GFRNONAA >60 08/08/2023   Lab Results  Component Value Date   CHOL 196 08/08/2023   HDL 58 08/08/2023   LDLCALC 120 (H) 08/08/2023   TRIG 89 08/08/2023   CHOLHDL 3.4 08/08/2023   Lab Results  Component Value Date   TSH 1.296 08/08/2023   No results found for: "HGBA1C" Lab Results  Component Value Date   WBC 5.3 02/20/2023   HGB 13.3 02/20/2023   HCT 43.0 02/20/2023   MCV 86 02/20/2023   PLT 176 02/20/2023   Lab Results  Component Value Date   ALT 16 08/08/2023   AST 20 08/08/2023   ALKPHOS 54 08/08/2023   BILITOT 0.4 08/08/2023   Lab Results  Component Value Date   VD25OH 35.6 02/14/2022     Patient Active Problem List   Diagnosis Date Noted   Essential hypertension 08/08/2023   Spondylosis without myelopathy or radiculopathy, lumbosacral region 07/27/2023   Arthralgia of right knee 07/27/2023   History of colonic polyps    Polyp of sigmoid  colon    Tubular adenoma of colon 10/11/2021   Muscle spasm of back 10/11/2021   Chronic pain of left knee 04/06/2017   Mixed hyperlipidemia 04/26/2016   Vitamin D deficiency 04/26/2016   BPPV (benign paroxysmal positional vertigo) 04/26/2016   Varicose veins of both lower extremities 04/26/2016   GERD (gastroesophageal reflux disease) 01/04/2015   Age-related osteoporosis without fracture 01/04/2015    Allergies  Allergen Reactions   Amoxicillin Hives   Hydrocodone Other (See Comments)    headache   Tizanidine Hcl Other (See Comments)    Itching, headache   Oxycodone Hcl Rash    Past Surgical History:  Procedure Laterality Date   COLONOSCOPY WITH PROPOFOL N/A 11/13/2016   Procedure: COLONOSCOPY WITH PROPOFOL;  Surgeon: Christena Deem, MD;  Location: [facility] ENDOSCOPY;  Service: Endoscopy;  Laterality: N/A;   COLONOSCOPY WITH PROPOFOL N/A 11/08/2021   Procedure: COLONOSCOPY WITH PROPOFOL;  Surgeon: Toney Reil, MD;  Location: [facility] ENDOSCOPY;  Service: Gastroenterology;  Laterality: N/A;    Social History   Tobacco Use   Smoking status: Former    Current packs/day: 0.00    Types: Cigarettes    Quit date: 10/14/1975    Years since quitting: 47.9   Smokeless tobacco: Never  Vaping Use   Vaping status: Never Used  Substance Use Topics   Alcohol use: No   Drug use: No  Medication list has been reviewed and updated.  Current Meds  Medication Sig   Ascorbic Acid (VITAMIN C) 500 MG CAPS Take 1,000 mg by mouth.   aspirin EC 81 MG tablet Take 81 mg by mouth daily as needed (learned that it is good to take everyday).   azelastine (ASTELIN) 0.1 % nasal spray SMARTSIG:1-2 Spray(s) Both Nares Twice Daily   Cholecalciferol (VITAMIN D) 2000 units tablet Take 2,000 Units by mouth daily.   cyclobenzaprine (FLEXERIL) 10 MG tablet TAKE 1 TABLET AT BEDTIME   diclofenac (VOLTAREN) 50 MG EC tablet Take 1 tablet (50 mg total) by mouth 2 (two) times daily as needed.    fluticasone (FLONASE) 50 MCG/ACT nasal spray USE 2 SPRAYS IN EACH NOSTRIL EVERY DAY   hydrocortisone cream 0.5 % Apply 1 Application topically 2 (two) times daily.   ibandronate (BONIVA) 150 MG tablet Take 1 tablet by mouth every 30 (thirty) days.   omeprazole (PRILOSEC) 40 MG capsule Take 1 capsule by mouth daily as needed.   simvastatin (ZOCOR) 10 MG tablet Take 10 mg by mouth at bedtime.   solifenacin (VESICARE) 5 MG tablet Take 5 mg by mouth daily.   timolol (TIMOPTIC) 0.5 % ophthalmic solution Place 1 drop into both eyes in the morning.   [DISCONTINUED] irbesartan (AVAPRO) 150 MG tablet Take 1 tablet (150 mg total) by mouth daily.       08/27/2023    9:21 AM 08/08/2023    8:28 AM 07/27/2023    3:36 PM 02/20/2023    9:33 AM  GAD 7 : Generalized Anxiety Score  Nervous, Anxious, on Edge 0 0 0 0  Control/stop worrying 0 0 0 0  Worry too much - different things 0 0 0 1  Trouble relaxing 0 0 0 0  Restless 0 0 0 0  Easily annoyed or irritable 0 0 0 0  Afraid - awful might happen 0 0 0 0  Total GAD 7 Score 0 0 0 1  Anxiety Difficulty Not difficult at all Not difficult at all Not difficult at all Not difficult at all       08/27/2023    9:21 AM 08/08/2023    8:28 AM 07/27/2023    3:36 PM  Depression screen PHQ 2/9  Decreased Interest 0 0 0  Down, Depressed, Hopeless 0 0 0  PHQ - 2 Score 0 0 0  Altered sleeping 0 0 0  Tired, decreased energy 0 1 0  Change in appetite 0 0 0  Feeling bad or failure about yourself  0 0 0  Trouble concentrating 0 0 0  Moving slowly or fidgety/restless 0 0 0  Suicidal thoughts 0 0 0  PHQ-9 Score 0 1 0  Difficult doing work/chores Not difficult at all Not difficult at all Not difficult at all    BP Readings from Last 3 Encounters:  08/27/23 118/72  08/08/23 126/84  07/27/23 118/82    Physical Exam Vitals and nursing note reviewed.  Constitutional:      General: She is not in acute distress.    Appearance: She is well-developed.  HENT:      Head: Normocephalic and atraumatic.  Cardiovascular:     Rate and Rhythm: Normal rate and regular rhythm.     Heart sounds: No murmur heard. Pulmonary:     Effort: Pulmonary effort is normal. No respiratory distress.     Breath sounds: No wheezing or rhonchi.  Musculoskeletal:     Cervical back: Normal range of motion.  Right lower leg: No edema.     Left lower leg: No edema.  Skin:    General: Skin is warm and dry.     Findings: No rash.  Neurological:     General: No focal deficit present.     Mental Status: She is alert and oriented to person, place, and time.  Psychiatric:        Mood and Affect: Mood normal.        Behavior: Behavior normal.     Wt Readings from Last 3 Encounters:  08/27/23 213 lb (96.6 kg)  08/08/23 215 lb (97.5 kg)  07/27/23 211 lb (95.7 kg)    BP 118/72 (BP Location: Right Arm, Cuff Size: Large)   Pulse 71   Ht 5\' 11"  (1.803 m)   Wt 213 lb (96.6 kg)   SpO2 97%   BMI 29.71 kg/m   Assessment and Plan:  Problem List Items Addressed This Visit       Unprioritized   Essential hypertension - Primary    Avapro started last visit.  She is doing well without side effects. Taking 150 mg daily.  BP at home 108-128/70's. DASH diet reviewed. Will continue same medications.      Relevant Medications   irbesartan (AVAPRO) 150 MG tablet    No follow-ups on file.    Reubin Milan, MD [facility] [place]

## 2023-08-27 NOTE — Assessment & Plan Note (Addendum)
Avapro started last visit.  She is doing well without side effects. Taking 150 mg daily.  BP at home 108-128/70's. DASH diet reviewed. Will continue same medications.

## 2023-08-28 ENCOUNTER — Encounter (INDEPENDENT_AMBULATORY_CARE_PROVIDER_SITE_OTHER): Payer: Medicare HMO

## 2023-08-31 ENCOUNTER — Ambulatory Visit (INDEPENDENT_AMBULATORY_CARE_PROVIDER_SITE_OTHER): Payer: Medicare HMO

## 2023-08-31 DIAGNOSIS — Z23 Encounter for immunization: Secondary | ICD-10-CM | POA: Diagnosis not present

## 2023-08-31 NOTE — Progress Notes (Signed)
Tolerated well. JM

## 2023-09-05 ENCOUNTER — Ambulatory Visit: Payer: Medicare HMO | Admitting: Internal Medicine

## 2023-09-18 ENCOUNTER — Ambulatory Visit (INDEPENDENT_AMBULATORY_CARE_PROVIDER_SITE_OTHER): Payer: Medicare HMO | Admitting: Nurse Practitioner

## 2023-10-04 ENCOUNTER — Ambulatory Visit
Admission: RE | Admit: 2023-10-04 | Discharge: 2023-10-04 | Disposition: A | Discharge status: Home / Self Care | Payer: Medicare HMO | Source: Ambulatory Visit | Attending: Obstetrics and Gynecology | Admitting: Obstetrics and Gynecology

## 2023-10-04 DIAGNOSIS — Z1231 Encounter for screening mammogram for malignant neoplasm of breast: Secondary | ICD-10-CM | POA: Diagnosis not present

## 2023-10-24 ENCOUNTER — Telehealth (INDEPENDENT_AMBULATORY_CARE_PROVIDER_SITE_OTHER): Payer: Self-pay | Admitting: Vascular Surgery

## 2023-10-24 ENCOUNTER — Ambulatory Visit: Payer: Medicare HMO | Admitting: Nurse Practitioner

## 2023-10-24 ENCOUNTER — Other Ambulatory Visit (INDEPENDENT_AMBULATORY_CARE_PROVIDER_SITE_OTHER): Payer: Self-pay

## 2023-10-24 MED ORDER — ALPRAZOLAM 0.5 MG PO TABS: ORAL_TABLET | ORAL | 0 refills | Status: DC

## 2023-10-24 NOTE — Telephone Encounter (Signed)
 Patient is scheduled for a laser ablation on 3.21.25 with Dr. Vonna Guardian. She will need a standard protocol RX sent in to [facility] in [place]. Thank you!

## 2023-10-24 NOTE — Telephone Encounter (Signed)
 sent

## 2023-11-05 ENCOUNTER — Other Ambulatory Visit: Payer: Self-pay | Admitting: Internal Medicine

## 2023-11-06 NOTE — Telephone Encounter (Signed)
Requested Prescriptions  Pending Prescriptions Disp Refills   fluticasone (FLONASE) 50 MCG/ACT nasal spray [Pharmacy Med Name: Fluticasone Propionate Nasal Suspension 50 MCG/ACT] 48 g 3    Sig: USE 2 SPRAYS IN EACH NOSTRIL EVERY DAY     Ear, Nose, and Throat: Nasal Preparations - Corticosteroids Passed - 11/06/2023 10:39 AM      Passed - Valid encounter within last 12 months    Recent Outpatient Visits           2 months ago Essential hypertension   [place] [facility] at [facility], Nyoka Cowden, MD   3 months ago Essential hypertension   [place] [facility] at [facility], Nyoka Cowden, MD   3 months ago Arthralgia of right knee   [facility] at [facility], Ocie Bob, MD   8 months ago Annual physical exam   [facility] at [facility], Nyoka Cowden, MD   9 months ago Acute non-recurrent maxillary sinusitis   [facility] at [facility], Nyoka Cowden, MD       Future Appointments             In 3 months [name], Nyoka Cowden, MD [facility] at [facility], [facility]

## 2023-11-26 ENCOUNTER — Ambulatory Visit: Payer: Self-pay | Admitting: Internal Medicine

## 2023-11-26 NOTE — Telephone Encounter (Signed)
 Copied from CRM [id number]. Topic: Clinical - Prescription Issue >> Nov 26, 2023 11:09 AM Carlatta H wrote: Reason for CRM: irbesartan (AVAPRO) 150 MG tablet [[id number]] is giving the patient an upset stomach and headache//Please call the patient  Called pt - left message on machine to return our call.

## 2023-11-26 NOTE — Telephone Encounter (Signed)
  Chief Complaint: Nausea-possible medication reaction to irbesartan Symptoms: Nausea Frequency: patient endorses nausea has been going on since starting medication-feels that the nausea is getting worse now Pertinent Negatives: Patient denies CP, SOB Disposition: [] ED /[] Urgent Care (no appt availability in office) / [x] Appointment(In office/virtual)/ []  [place] [facility]/ [] Home Care/ [] Refused Recommended Disposition /[] [place] [facility]/ []  Follow-up with PCP Additional Notes: patient called with concerns of nausea that she believes is connected to her irbesartan. Patient states this has been going on since she started the medication in November. Patient believes the nausea is getting worse now. Denies vomiting but states she feels like she would feel better if she could vomit. Per protocol, recommendation is for patient to be seen within three days. Appointment made for 11/27/2023 at 9:00 am. Patient verbalized understanding of plan and all questions answered.    Reason for Disposition  [1] Caller has URGENT medicine question about med that PCP or specialist prescribed AND [2] triager unable to answer question  Nausea lasts > 1 week  Answer Assessment - Initial Assessment Questions 1. NAME of MEDICINE: "What medicine(s) are you calling about?"     Irbesartan (avapro) 150 mg 2. QUESTION: "What is your question?" (e.g., double dose of medicine, side effect)     Medication is causing upset stomach and nausea 3. PRESCRIBER: "Who prescribed the medicine?" Reason: if prescribed by specialist, call should be referred to that group.     Bari Edward MD 4. SYMPTOMS: "Do you have any symptoms?" If Yes, ask: "What symptoms are you having?"  "How bad are the symptoms (e.g., mild, moderate, severe)     Nausea, headache  Answer Assessment - Initial Assessment Questions 1. NAUSEA SEVERITY: "How bad is the nausea?" (e.g., mild, moderate, severe; dehydration, weight loss)   - MILD: loss of  appetite without change in eating habits   - MODERATE: decreased oral intake without significant weight loss, dehydration, or malnutrition   - SEVERE: inadequate caloric or fluid intake, significant weight loss, symptoms of dehydration     Mild 2. ONSET: "When did the nausea begin?"     Since she started the medication and feels like it is getting worse 3. VOMITING: "Any vomiting?" If Yes, ask: "How many times today?"     No 4. RECURRENT SYMPTOM: "Have you had nausea before?" If Yes, ask: "When was the last time?" "What happened that time?"     Yes has been going for awhile 5. CAUSE: "What do you think is causing the nausea?"     medication  Protocols used: Medication Question Call-A-AH, Nausea-A-AH

## 2023-11-27 ENCOUNTER — Encounter: Payer: Self-pay | Admitting: Internal Medicine

## 2023-11-27 ENCOUNTER — Ambulatory Visit (INDEPENDENT_AMBULATORY_CARE_PROVIDER_SITE_OTHER): Admitting: Internal Medicine

## 2023-11-27 VITALS — BP 120/82 | HR 65 | Ht 71.0 in | Wt 212.0 lb

## 2023-11-27 DIAGNOSIS — I10 Essential (primary) hypertension: Secondary | ICD-10-CM | POA: Diagnosis not present

## 2023-11-27 MED ORDER — AMLODIPINE BESYLATE 2.5 MG PO TABS: 2.5000 mg | ORAL_TABLET | Freq: Every day | ORAL | 0 refills | Status: DC

## 2023-11-27 NOTE — Assessment & Plan Note (Addendum)
 Pt started on irbesartan 6 months ago. Now believes that she has a SE of nausea that is worsening as well as general fatigue. Will stop Irbesartan and begin amlodipine 2.5 mg Recheck in one month.

## 2023-11-27 NOTE — Progress Notes (Signed)
 Date:  11/27/2023   Name:  Christine Ray   DOB:  03-Jan-1956   MRN:  [id number]   Chief Complaint: Nausea (Patient said she has been feeling fatigue, she said she think it is her BP med irbesartan 150 mg tabs)  Hypertension This is a chronic problem. The current episode started more than 1 month ago. The problem has been gradually improving since onset. The problem is controlled. Pertinent negatives include no chest pain, headaches, palpitations or shortness of breath. Past treatments include angiotensin blockers. There is no history of kidney disease, CAD/MI or CVA.    Review of Systems  Constitutional:  Positive for fatigue. Negative for chills, fever and unexpected weight change.  HENT:  Negative for trouble swallowing.   Respiratory:  Negative for chest tightness and shortness of breath.   Cardiovascular:  Negative for chest pain and palpitations.  Neurological:  Negative for dizziness and headaches.  Psychiatric/Behavioral:  Negative for dysphoric mood and sleep disturbance. The patient is not nervous/anxious.      Lab Results  Component Value Date   NA 138 08/08/2023   K 3.7 08/08/2023   CO2 28 08/08/2023   GLUCOSE 85 08/08/2023   BUN 16 08/08/2023   CREATININE 0.87 08/08/2023   CALCIUM 8.8 (L) 08/08/2023   EGFR 71 02/20/2023   GFRNONAA >60 08/08/2023   Lab Results  Component Value Date   CHOL 196 08/08/2023   HDL 58 08/08/2023   LDLCALC 120 (H) 08/08/2023   TRIG 89 08/08/2023   CHOLHDL 3.4 08/08/2023   Lab Results  Component Value Date   TSH 1.296 08/08/2023   No results found for: "HGBA1C" Lab Results  Component Value Date   WBC 5.3 02/20/2023   HGB 13.3 02/20/2023   HCT 43.0 02/20/2023   MCV 86 02/20/2023   PLT 176 02/20/2023   Lab Results  Component Value Date   ALT 16 08/08/2023   AST 20 08/08/2023   ALKPHOS 54 08/08/2023   BILITOT 0.4 08/08/2023   Lab Results  Component Value Date   VD25OH 35.6 02/14/2022     Patient Active Problem  List   Diagnosis Date Noted   Essential hypertension 08/08/2023   Spondylosis without myelopathy or radiculopathy, lumbosacral region 07/27/2023   Arthralgia of right knee 07/27/2023   History of colonic polyps    Polyp of sigmoid colon    Tubular adenoma of colon 10/11/2021   Muscle spasm of back 10/11/2021   Chronic pain of left knee 04/06/2017   Mixed hyperlipidemia 04/26/2016   Vitamin D deficiency 04/26/2016   BPPV (benign paroxysmal positional vertigo) 04/26/2016   Varicose veins of both lower extremities 04/26/2016   GERD (gastroesophageal reflux disease) 01/04/2015   Age-related osteoporosis without fracture 01/04/2015    Allergies  Allergen Reactions   Amoxicillin Hives   Hydrocodone Other (See Comments)    headache   Tizanidine Hcl Other (See Comments)    Itching, headache   Oxycodone Hcl Rash    Past Surgical History:  Procedure Laterality Date   COLONOSCOPY WITH PROPOFOL N/A 11/13/2016   Procedure: COLONOSCOPY WITH PROPOFOL;  Surgeon: Christena Deem, MD;  Location: [facility] ENDOSCOPY;  Service: Endoscopy;  Laterality: N/A;   COLONOSCOPY WITH PROPOFOL N/A 11/08/2021   Procedure: COLONOSCOPY WITH PROPOFOL;  Surgeon: Toney Reil, MD;  Location: [facility] ENDOSCOPY;  Service: Gastroenterology;  Laterality: N/A;    Social History   Tobacco Use   Smoking status: Former    Current packs/day: 0.00  Types: Cigarettes    Quit date: 10/14/1975    Years since quitting: 48.1   Smokeless tobacco: Never  Vaping Use   Vaping status: Never Used  Substance Use Topics   Alcohol use: No   Drug use: No     Medication list has been reviewed and updated.  Current Meds  Medication Sig   amLODipine (NORVASC) 2.5 MG tablet Take 1 tablet (2.5 mg total) by mouth daily.   Ascorbic Acid (VITAMIN C) 500 MG CAPS Take 1,000 mg by mouth.   aspirin EC 81 MG tablet Take 81 mg by mouth daily as needed (learned that it is good to take everyday).   Cholecalciferol (VITAMIN D) 2000  units tablet Take 2,000 Units by mouth daily.   cyclobenzaprine (FLEXERIL) 10 MG tablet TAKE 1 TABLET AT BEDTIME   ibandronate (BONIVA) 150 MG tablet Take 1 tablet by mouth every 30 (thirty) days.   simvastatin (ZOCOR) 10 MG tablet Take 10 mg by mouth at bedtime.   solifenacin (VESICARE) 5 MG tablet Take 5 mg by mouth daily.   [DISCONTINUED] irbesartan (AVAPRO) 150 MG tablet Take 1 tablet (150 mg total) by mouth daily.       11/27/2023    9:06 AM 08/27/2023    9:21 AM 08/08/2023    8:28 AM 07/27/2023    3:36 PM  GAD 7 : Generalized Anxiety Score  Nervous, Anxious, on Edge 0 0 0 0  Control/stop worrying 0 0 0 0  Worry too much - different things 0 0 0 0  Trouble relaxing 1 0 0 0  Restless 0 0 0 0  Easily annoyed or irritable 0 0 0 0  Afraid - awful might happen 0 0 0 0  Total GAD 7 Score 1 0 0 0  Anxiety Difficulty Somewhat difficult Not difficult at all Not difficult at all Not difficult at all       11/27/2023    9:06 AM 08/27/2023    9:21 AM 08/08/2023    8:28 AM  Depression screen PHQ 2/9  Decreased Interest 0 0 0  Down, Depressed, Hopeless 0 0 0  PHQ - 2 Score 0 0 0  Altered sleeping 0 0 0  Tired, decreased energy 1 0 1  Change in appetite 0 0 0  Feeling bad or failure about yourself  0 0 0  Trouble concentrating 0 0 0  Moving slowly or fidgety/restless 0 0 0  Suicidal thoughts 0 0 0  PHQ-9 Score 1 0 1  Difficult doing work/chores Somewhat difficult Not difficult at all Not difficult at all    BP Readings from Last 3 Encounters:  11/27/23 120/82  08/27/23 118/72  08/08/23 126/84    Physical Exam Vitals and nursing note reviewed.  Constitutional:      General: She is not in acute distress.    Appearance: Normal appearance. She is well-developed.  HENT:     Head: Normocephalic and atraumatic.  Cardiovascular:     Rate and Rhythm: Normal rate and regular rhythm.  Pulmonary:     Effort: Pulmonary effort is normal. No respiratory distress.     Breath sounds:  No wheezing or rhonchi.  Musculoskeletal:     Cervical back: Normal range of motion.     Right lower leg: No edema.     Left lower leg: No edema.  Skin:    General: Skin is warm and dry.     Findings: No rash.  Neurological:     Mental Status: She  is alert and oriented to person, place, and time.  Psychiatric:        Mood and Affect: Mood normal.        Behavior: Behavior normal.     Wt Readings from Last 3 Encounters:  11/27/23 212 lb (96.2 kg)  08/27/23 213 lb (96.6 kg)  08/08/23 215 lb (97.5 kg)    BP 120/82   Pulse 65   Ht 5\' 11"  (1.803 m)   Wt 212 lb (96.2 kg)   SpO2 99%   BMI 29.57 kg/m   Assessment and Plan:  Problem List Items Addressed This Visit       Unprioritized   Essential hypertension - Primary   Pt started on irbesartan 6 months ago. Now believes that she has a SE of nausea that is worsening as well as general fatigue. Will stop Irbesartan and begin amlodipine 2.5 mg Recheck in one month.      Relevant Medications   amLODipine (NORVASC) 2.5 MG tablet   Other Relevant Orders   CBC with Differential/Platelet   Comprehensive metabolic panel   TSH    Return in about 4 weeks (around 12/25/2023) for HTN.    Reubin Milan, MD [facility] [place]

## 2023-11-28 ENCOUNTER — Encounter: Payer: Self-pay | Admitting: Internal Medicine

## 2023-11-28 LAB — COMPREHENSIVE METABOLIC PANEL
ALT: 12 IU/L (ref 0–32)
AST: 15 IU/L (ref 0–40)
Albumin: 4.3 g/dL (ref 3.9–4.9)
Alkaline Phosphatase: 66 IU/L (ref 44–121)
BUN/Creatinine Ratio: 12 (ref 12–28)
BUN: 11 mg/dL (ref 8–27)
Bilirubin Total: 0.4 mg/dL (ref 0.0–1.2)
CO2: 25 mmol/L (ref 20–29)
Calcium: 9.5 mg/dL (ref 8.7–10.3)
Chloride: 102 mmol/L (ref 96–106)
Creatinine, Ser: 0.89 mg/dL (ref 0.57–1.00)
Globulin, Total: 2.6 g/dL (ref 1.5–4.5)
Glucose: 78 mg/dL (ref 70–99)
Potassium: 4.5 mmol/L (ref 3.5–5.2)
Sodium: 145 mmol/L — ABNORMAL HIGH (ref 134–144)
Total Protein: 6.9 g/dL (ref 6.0–8.5)
eGFR: 70 mL/min/{1.73_m2} (ref >59)

## 2023-11-28 LAB — CBC WITH DIFFERENTIAL/PLATELET
Basophils Absolute: 0 10*3/uL (ref 0.0–0.2)
Basos: 0 %
EOS (ABSOLUTE): 0.1 10*3/uL (ref 0.0–0.4)
Eos: 1 %
Hematocrit: 42.3 % (ref 34.0–46.6)
Hemoglobin: 13.6 g/dL (ref 11.1–15.9)
Immature Grans (Abs): 0 10*3/uL (ref 0.0–0.1)
Immature Granulocytes: 0 %
Lymphocytes Absolute: 2 10*3/uL (ref 0.7–3.1)
Lymphs: 37 %
MCH: 28 pg (ref 26.6–33.0)
MCHC: 32.2 g/dL (ref 31.5–35.7)
MCV: 87 fL (ref 79–97)
Monocytes Absolute: 0.4 10*3/uL (ref 0.1–0.9)
Monocytes: 8 %
Neutrophils Absolute: 3 10*3/uL (ref 1.4–7.0)
Neutrophils: 54 %
Platelets: 187 10*3/uL (ref 150–450)
RBC: 4.86 x10E6/uL (ref 3.77–5.28)
RDW: 13.9 % (ref 11.7–15.4)
WBC: 5.5 10*3/uL (ref 3.4–10.8)

## 2023-11-28 LAB — TSH: TSH: 1 u[IU]/mL (ref 0.450–4.500)

## 2023-12-07 ENCOUNTER — Encounter (INDEPENDENT_AMBULATORY_CARE_PROVIDER_SITE_OTHER): Payer: Self-pay | Admitting: Vascular Surgery

## 2023-12-07 ENCOUNTER — Ambulatory Visit (INDEPENDENT_AMBULATORY_CARE_PROVIDER_SITE_OTHER): Payer: Medicare HMO | Admitting: Vascular Surgery

## 2023-12-07 VITALS — BP 132/86 | HR 69 | Resp 16 | Wt 210.4 lb

## 2023-12-07 DIAGNOSIS — I83813 Varicose veins of bilateral lower extremities with pain: Secondary | ICD-10-CM

## 2023-12-07 NOTE — Progress Notes (Signed)
 Christine Ray is a 68 y.o. female who presents with symptomatic venous reflux  Past Medical History:  Diagnosis Date   Hyperlipidemia    Hyperlipidemia    Lateral epicondylitis of right elbow 01/04/2015    Past Surgical History:  Procedure Laterality Date   COLONOSCOPY WITH PROPOFOL N/A 11/13/2016   Procedure: COLONOSCOPY WITH PROPOFOL;  Surgeon: Christena Deem, MD;  Location: [facility] ENDOSCOPY;  Service: Endoscopy;  Laterality: N/A;   COLONOSCOPY WITH PROPOFOL N/A 11/08/2021   Procedure: COLONOSCOPY WITH PROPOFOL;  Surgeon: Toney Reil, MD;  Location: [facility] ENDOSCOPY;  Service: Gastroenterology;  Laterality: N/A;     Current Outpatient Medications:    amLODipine (NORVASC) 2.5 MG tablet, Take 1 tablet (2.5 mg total) by mouth daily., Disp: 30 tablet, Rfl: 0   Ascorbic Acid (VITAMIN C) 500 MG CAPS, Take 1,000 mg by mouth., Disp: , Rfl:    aspirin EC 81 MG tablet, Take 81 mg by mouth daily as needed (learned that it is good to take everyday)., Disp: , Rfl:    azelastine (ASTELIN) 0.1 % nasal spray, SMARTSIG:1-2 Spray(s) Both Nares Twice Daily, Disp: , Rfl:    Cholecalciferol (VITAMIN D) 2000 units tablet, Take 2,000 Units by mouth daily., Disp: , Rfl:    cyclobenzaprine (FLEXERIL) 10 MG tablet, TAKE 1 TABLET AT BEDTIME, Disp: 30 tablet, Rfl: 11   ibandronate (BONIVA) 150 MG tablet, Take 1 tablet by mouth every 30 (thirty) days., Disp: , Rfl:    simvastatin (ZOCOR) 10 MG tablet, Take 10 mg by mouth at bedtime., Disp: , Rfl: 11   solifenacin (VESICARE) 5 MG tablet, Take 5 mg by mouth daily., Disp: , Rfl:    fluticasone (FLONASE) 50 MCG/ACT nasal spray, USE 2 SPRAYS IN EACH NOSTRIL EVERY DAY (Patient not taking: Reported on 12/07/2023), Disp: 48 g, Rfl: 3   hydrocortisone cream 0.5 %, Apply 1 Application topically 2 (two) times daily. (Patient not taking: Reported on 12/07/2023), Disp: 30 g, Rfl: 0   omeprazole (PRILOSEC) 40 MG capsule, Take 1 capsule by mouth daily as needed.  (Patient not taking: Reported on 12/07/2023), Disp: , Rfl:    timolol (TIMOPTIC) 0.5 % ophthalmic solution, Place 1 drop into both eyes in the morning. (Patient not taking: Reported on 12/07/2023), Disp: , Rfl:   Allergies  Allergen Reactions   Amoxicillin Hives   Hydrocodone Other (See Comments)    headache   Tizanidine Hcl Other (See Comments)    Itching, headache   Oxycodone Hcl Rash     Varicose veins of both lower extremities     PLAN: The patient's left lower extremity was sterilely prepped and draped. The ultrasound machine was used to visualize the saphenous vein throughout its course. A segment in the mid calf was selected for access. The saphenous vein was accessed without difficulty using ultrasound guidance with a micropuncture needle. A 0.018 wire was then placed beyond the saphenofemoral junction and the needle was removed. The 65 cm sheath was then placed over the wire and the wire and dilator were removed. The laser fiber was then placed through the sheath and its tip was placed approximately 5 centimeters below the saphenofemoral junction. Tumescent anesthesia was then created with a dilute lidocaine solution. Laser energy was then delivered with constant withdrawal of the sheath and laser fiber. Approximately 1728 joules of energy were delivered over a length of 44 centimeters using a 1470 Hz VenaCure machine at 7 W. Sterile dressings were placed. The patient tolerated the procedure well without  obvious complications.   Follow-up in 1 week with post-laser duplex.

## 2023-12-12 ENCOUNTER — Other Ambulatory Visit (INDEPENDENT_AMBULATORY_CARE_PROVIDER_SITE_OTHER): Payer: Self-pay | Admitting: Vascular Surgery

## 2023-12-12 DIAGNOSIS — I83813 Varicose veins of bilateral lower extremities with pain: Secondary | ICD-10-CM

## 2023-12-13 ENCOUNTER — Ambulatory Visit (INDEPENDENT_AMBULATORY_CARE_PROVIDER_SITE_OTHER): Payer: Medicare HMO

## 2023-12-13 DIAGNOSIS — I83813 Varicose veins of bilateral lower extremities with pain: Secondary | ICD-10-CM | POA: Diagnosis not present

## 2023-12-26 ENCOUNTER — Encounter: Payer: Self-pay | Admitting: Internal Medicine

## 2023-12-26 ENCOUNTER — Ambulatory Visit (INDEPENDENT_AMBULATORY_CARE_PROVIDER_SITE_OTHER): Admitting: Internal Medicine

## 2023-12-26 VITALS — BP 126/76 | HR 86 | Ht 71.0 in | Wt 211.0 lb

## 2023-12-26 DIAGNOSIS — I83813 Varicose veins of bilateral lower extremities with pain: Secondary | ICD-10-CM

## 2023-12-26 DIAGNOSIS — I10 Essential (primary) hypertension: Secondary | ICD-10-CM | POA: Diagnosis not present

## 2023-12-26 MED ORDER — AMLODIPINE BESYLATE 2.5 MG PO TABS: 2.5000 mg | ORAL_TABLET | Freq: Every day | ORAL | 1 refills | Status: DC

## 2023-12-26 MED ORDER — AMLODIPINE BESYLATE 2.5 MG PO TABS: 2.5000 mg | ORAL_TABLET | Freq: Every day | ORAL | 0 refills | Status: DC

## 2023-12-26 NOTE — Assessment & Plan Note (Signed)
 Blood pressure is well controlled.  Current medications amlodipine - Benicar stopped due to fatigue which has resolved. Has some mild ankle edema but recently had LE vein procedure and is wearing support stockings Will continue same regimen along with efforts to limit dietary sodium. Consider adding low dose diuretic next visit if needed

## 2023-12-26 NOTE — Progress Notes (Signed)
 Date:  12/26/2023   Name:  Christine Ray   DOB:  [birth year]   MRN:  [id number]   Chief Complaint: Hypertension  Hypertension This is a chronic problem. Pertinent negatives include no chest pain, headaches, palpitations or shortness of breath. Past treatments include calcium channel blockers (stopped Benicar and started amlodipine last month).    Review of Systems  Constitutional:  Negative for chills, fatigue and unexpected weight change.  HENT:  Negative for trouble swallowing.   Eyes:  Negative for visual disturbance.  Respiratory:  Negative for cough, chest tightness, shortness of breath and wheezing.   Cardiovascular:  Positive for leg swelling (ankle edema). Negative for chest pain and palpitations.  Gastrointestinal:  Negative for abdominal pain, constipation and diarrhea.  Musculoskeletal:  Negative for arthralgias and myalgias.  Neurological:  Negative for dizziness, weakness, light-headedness and headaches.     Lab Results  Component Value Date   NA 145 (H) 11/27/2023   K 4.5 11/27/2023   CO2 25 11/27/2023   GLUCOSE 78 11/27/2023   BUN 11 11/27/2023   CREATININE 0.89 11/27/2023   CALCIUM 9.5 11/27/2023   EGFR 70 11/27/2023   GFRNONAA >60 08/08/2023   Lab Results  Component Value Date   CHOL 196 08/08/2023   HDL 58 08/08/2023   LDLCALC 120 (H) 08/08/2023   TRIG 89 08/08/2023   CHOLHDL 3.4 08/08/2023   Lab Results  Component Value Date   TSH 1.000 11/27/2023   No results found for: "HGBA1C" Lab Results  Component Value Date   WBC 5.5 11/27/2023   HGB 13.6 11/27/2023   HCT 42.3 11/27/2023   MCV 87 11/27/2023   PLT 187 11/27/2023   Lab Results  Component Value Date   ALT 12 11/27/2023   AST 15 11/27/2023   ALKPHOS 66 11/27/2023   BILITOT 0.4 11/27/2023   Lab Results  Component Value Date   VD25OH 35.6 02/14/2022     Patient Active Problem List   Diagnosis Date Noted   Essential hypertension 08/08/2023   Spondylosis without myelopathy or  radiculopathy, lumbosacral region 07/27/2023   Arthralgia of right knee 07/27/2023   History of colonic polyps    Polyp of sigmoid colon    Tubular adenoma of colon 10/11/2021   Chronic pain of left knee 04/06/2017   Mixed hyperlipidemia 04/26/2016   Vitamin D deficiency 04/26/2016   BPPV (benign paroxysmal positional vertigo) 04/26/2016   Varicose veins of both lower extremities 04/26/2016   GERD (gastroesophageal reflux disease) 01/04/2015   Age-related osteoporosis without fracture 01/04/2015    Allergies  Allergen Reactions   Amoxicillin Hives   Hydrocodone Other (See Comments)    headache   Tizanidine Hcl Other (See Comments)    Itching, headache   Oxycodone Hcl Rash    Past Surgical History:  Procedure Laterality Date   COLONOSCOPY WITH PROPOFOL N/A 11/13/2016   Procedure: COLONOSCOPY WITH PROPOFOL;  Surgeon: Christena Deem, MD;  Location: [facility] ENDOSCOPY;  Service: Endoscopy;  Laterality: N/A;   COLONOSCOPY WITH PROPOFOL N/A 11/08/2021   Procedure: COLONOSCOPY WITH PROPOFOL;  Surgeon: Toney Reil, MD;  Location[facility]COPY;  Service: Gastroenterology;  Laterality: N/A;    Social History   Tobacco Use   Smoking status: Former    Current packs/day: 0.00    Types: Cigarettes    Quit date: 10/14/1975    Years since quitting: 48.2   Smokeless tobacco: Never  Vaping Use   Vaping status: Never Used  Substance Use Topics  Alcohol use: No   Drug use: No     Medication list has been reviewed and updated.  Current Meds  Medication Sig   Ascorbic Acid (VITAMIN C) 500 MG CAPS Take 1,000 mg by mouth.   aspirin EC 81 MG tablet Take 81 mg by mouth daily as needed (learned that it is good to take everyday).   azelastine (ASTELIN) 0.1 % nasal spray SMARTSIG:1-2 Spray(s) Both Nares Twice Daily   Cholecalciferol (VITAMIN D) 2000 units tablet Take 2,000 Units by mouth daily.   cyclobenzaprine (FLEXERIL) 10 MG tablet TAKE 1 TABLET AT BEDTIME   ibandronate  (BONIVA) 150 MG tablet Take 1 tablet by mouth every 30 (thirty) days.   simvastatin (ZOCOR) 10 MG tablet Take 10 mg by mouth at bedtime.   solifenacin (VESICARE) 5 MG tablet Take 5 mg by mouth daily.   [DISCONTINUED] amLODipine (NORVASC) 2.5 MG tablet Take 1 tablet (2.5 mg total) by mouth daily.       12/26/2023   11:02 AM 11/27/2023    9:06 AM 08/27/2023    9:21 AM 08/08/2023    8:28 AM  GAD 7 : Generalized Anxiety Score  Nervous, Anxious, on Edge 0 0 0 0  Control/stop worrying 0 0 0 0  Worry too much - different things 0 0 0 0  Trouble relaxing 0 1 0 0  Restless 0 0 0 0  Easily annoyed or irritable 0 0 0 0  Afraid - awful might happen 0 0 0 0  Total GAD 7 Score 0 1 0 0  Anxiety Difficulty Not difficult at all Somewhat difficult Not difficult at all Not difficult at all       12/26/2023   11:02 AM 11/27/2023    9:06 AM 08/27/2023    9:21 AM  Depression screen PHQ 2/9  Decreased Interest 1 0 0  Down, Depressed, Hopeless 0 0 0  PHQ - 2 Score 1 0 0  Altered sleeping 0 0 0  Tired, decreased energy 0 1 0  Change in appetite 0 0 0  Feeling bad or failure about yourself  0 0 0  Trouble concentrating 0 0 0  Moving slowly or fidgety/restless 0 0 0  Suicidal thoughts 0 0 0  PHQ-9 Score 1 1 0  Difficult doing work/chores Not difficult at all Somewhat difficult Not difficult at all    BP Readings from Last 3 Encounters:  12/26/23 126/76  12/07/23 132/86  11/27/23 120/82    Physical Exam Vitals and nursing note reviewed.  Constitutional:      General: She is not in acute distress.    Appearance: Normal appearance. She is well-developed.  HENT:     Head: Normocephalic and atraumatic.  Cardiovascular:     Rate and Rhythm: Normal rate and regular rhythm.     Heart sounds: No murmur heard. Pulmonary:     Effort: Pulmonary effort is normal. No respiratory distress.     Breath sounds: No wheezing or rhonchi.  Musculoskeletal:     Cervical back: Normal range of motion.     Right  lower leg: Edema present.     Left lower leg: Edema (trace ankle edema bilat) present.  Skin:    General: Skin is warm and dry.     Findings: No rash.  Neurological:     General: No focal deficit present.     Mental Status: She is alert and oriented to person, place, and time.  Psychiatric:  Mood and Affect: Mood normal.        Behavior: Behavior normal.     Wt Readings from Last 3 Encounters:  12/26/23 211 lb (95.7 kg)  12/07/23 210 lb 6.4 oz (95.4 kg)  11/27/23 212 lb (96.2 kg)    BP 126/76   Pulse 86   Ht 5\' 11"  (1.803 m)   Wt 211 lb (95.7 kg)   SpO2 98%   BMI 29.43 kg/m   Assessment and Plan:  Problem List Items Addressed This Visit       Unprioritized   Varicose veins of both lower extremities (Chronic)   Recent procedure with some mild ankle edema Continue sodium restriction, support hose Discuss progress with VS next visit      Relevant Medications   amLODipine (NORVASC) 2.5 MG tablet   Essential hypertension - Primary (Chronic)   Blood pressure is well controlled.  Current medications amlodipine - Benicar stopped due to fatigue which has resolved. Has some mild ankle edema but recently had LE vein procedure and is wearing support stockings Will continue same regimen along with efforts to limit dietary sodium. Consider adding low dose diuretic next visit if needed       Relevant Medications   amLODipine (NORVASC) 2.5 MG tablet    Return in about 3 months (around 03/26/2024) for HTN.    Reubin Milan, MD [facility] [place]

## 2023-12-26 NOTE — Assessment & Plan Note (Signed)
 Recent procedure with some mild ankle edema Continue sodium restriction, support hose Discuss progress with VS next visit

## 2024-01-02 ENCOUNTER — Other Ambulatory Visit: Payer: Self-pay | Admitting: Internal Medicine

## 2024-01-03 NOTE — Telephone Encounter (Signed)
 Requested medications are due for refill today.  unsure  Requested medications are on the active medications list.  no  Last refill. never  Future visit scheduled.   yes  Notes to clinic.  Not on med list. Refusal not delegated.    Requested Prescriptions  Pending Prescriptions Disp Refills   meclizine (ANTIVERT) 25 MG tablet [Pharmacy Med Name: Meclizine HCl Oral Tablet 25 MG] 90 tablet 11    Sig: TAKE 1 TABLET THREE TIMES DAILY AS NEEDED FOR DIZZINESS     Not Delegated - Gastroenterology: Antiemetics Failed - 01/03/2024 10:06 AM      Failed - This refill cannot be delegated      Passed - Valid encounter within last 6 months    Recent Outpatient Visits           1 week ago Essential hypertension   [place] [facility] at [facility], Chales [place], MD   1 month ago Essential hypertension   [place] [facility] at [facility], Chales [place], MD       Future Appointments             In 1 month Gala Jubilee, Chales [place], MD [facility] Medicine at [facility], [facility]   In 2 months Gala Jubilee, Chales [place], MD [facility] Primary Care & [facility] Medicine at [facility], [facility]

## 2024-01-04 ENCOUNTER — Ambulatory Visit (INDEPENDENT_AMBULATORY_CARE_PROVIDER_SITE_OTHER): Payer: Medicare HMO | Admitting: Nurse Practitioner

## 2024-01-07 ENCOUNTER — Telehealth: Payer: Self-pay

## 2024-01-07 ENCOUNTER — Ambulatory Visit (INDEPENDENT_AMBULATORY_CARE_PROVIDER_SITE_OTHER): Admitting: Nurse Practitioner

## 2024-01-07 ENCOUNTER — Encounter (INDEPENDENT_AMBULATORY_CARE_PROVIDER_SITE_OTHER): Payer: Self-pay | Admitting: Nurse Practitioner

## 2024-01-07 VITALS — BP 127/82 | HR 66 | Resp 16 | Wt 211.4 lb

## 2024-01-07 DIAGNOSIS — I10 Essential (primary) hypertension: Secondary | ICD-10-CM

## 2024-01-07 DIAGNOSIS — I83812 Varicose veins of left lower extremities with pain: Secondary | ICD-10-CM | POA: Diagnosis not present

## 2024-01-07 NOTE — Telephone Encounter (Signed)
 Copied from CRM [id number]. Topic: Clinical - Medication Question >> Jan 07, 2024  4:04 PM Christine Ray wrote: Reason for CRM: Vascular surgeon gave the OK for medication for swelling- please call patient - [phone]- if something needs to be called in right away please send to [facility]

## 2024-01-07 NOTE — Progress Notes (Signed)
 Subjective:    Patient ID: Christine Ray, female    DOB: August 01, 1956, 68 y.o.   MRN: [id number] Chief Complaint  Patient presents with   Follow-up    4 week post laser    The patient returns to the office for followup status post laser ablation of the left saphenous vein on 12/07/2023.  The patient note significant improvement in the lower extremity pain but not resolution of the symptoms. The patient notes multiple residual varicosities bilaterally which continued to hurt with dependent positions and remained tender to palpation. The patient's swelling is minimally from preoperative status. The patient continues to wear graduated compression stockings on a daily basis but these are not eliminating the pain and discomfort. The patient continues to use over-the-counter anti-inflammatory medications to treat the pain and related symptoms but this has not given the patient relief. The patient notes the pain in the lower extremities is causing problems with daily exercise, problems at work and even with household activities such as preparing meals and doing dishes.  The patient is otherwise done well and there have been no complications related to the laser procedure or interval changes in the patient's overall   Post laser ultrasound shows successful ablation of the left gsv      Review of Systems  Skin:  Positive for color change.  All other systems reviewed and are negative.      Objective:   Physical Exam Vitals reviewed.  HENT:     Head: Normocephalic.  Cardiovascular:     Rate and Rhythm: Normal rate.     Pulses: Normal pulses.  Pulmonary:     Effort: Pulmonary effort is normal.  Musculoskeletal:        General: Tenderness present.  Skin:    General: Skin is warm and dry.  Neurological:     Mental Status: She is alert and oriented to person, place, and time.  Psychiatric:        Mood and Affect: Mood normal.        Behavior: Behavior normal.        Thought Content:  Thought content normal.        Judgment: Judgment normal.     BP 127/82   Pulse 66   Resp 16   Wt 211 lb 6.4 oz (95.9 kg)   BMI 29.48 kg/m   Past Medical History:  Diagnosis Date   Hyperlipidemia    Hyperlipidemia    Lateral epicondylitis of right elbow 01/04/2015    Social History   Socioeconomic History   Marital status: Married    Spouse name: Not on file   Number of children: 0   Years of education: Not on file   Highest education level: 12th grade  Occupational History   Not on file  Tobacco Use   Smoking status: Former    Current packs/day: 0.00    Types: Cigarettes    Quit date: 10/14/1975    Years since quitting: 48.2   Smokeless tobacco: Never  Vaping Use   Vaping status: Never Used  Substance and Sexual Activity   Alcohol use: No   Drug use: No   Sexual activity: Not on file  Other Topics Concern   Not on file  Social History Narrative   Not on file   Social Drivers of Health   Financial Resource Strain: Low Risk  (11/26/2023)   Overall Financial Resource Strain (CARDIA)    Difficulty of Paying Living Expenses: Not hard at all  Food  Insecurity: No Food Insecurity (11/26/2023)   Hunger Vital Sign    Worried About Running Out of Food in the Last Year: Never true    Ran Out of Food in the Last Year: Never true  Transportation Needs: No Transportation Needs (11/26/2023)   PRAPARE - Administrator, Civil Service (Medical): No    Lack of Transportation (Non-Medical): No  Physical Activity: Insufficiently Active (11/26/2023)   Exercise Vital Sign    Days of Exercise per Week: 2 days    Minutes of Exercise per Session: 20 min  Stress: No Stress Concern Present (11/26/2023)   [employer] of Occupational Health - Occupational Stress Questionnaire    Feeling of Stress : Not at all  Social Connections: Socially Integrated (11/26/2023)   Social Connection and Isolation Panel [NHANES]    Frequency of Communication with Friends and Family:  More than three times a week    Frequency of Social Gatherings with Friends and Family: Once a week    Attends Religious Services: More than 4 times per year    Active Member of [employer] or Organizations: Yes    Attends Banker Meetings: Patient declined    Marital Status: Married  Catering manager Violence: Not At Risk (05/15/2023)   Humiliation, Afraid, Rape, and Kick questionnaire    Fear of Current or Ex-Partner: No    Emotionally Abused: No    Physically Abused: No    Sexually Abused: No    Past Surgical History:  Procedure Laterality Date   COLONOSCOPY WITH PROPOFOL  N/A 11/13/2016   Procedure: COLONOSCOPY WITH PROPOFOL ;  Surgeon: Deveron Fly, MD;  Location: [facility] ENDOSCOPY;  Service: Endoscopy;  Laterality: N/A;   COLONOSCOPY WITH PROPOFOL  N/A 11/08/2021   Procedure: COLONOSCOPY WITH PROPOFOL ;  Surgeon: Selena Daily, MD;  Location: [facility] ENDOSCOPY;  Service: Gastroenterology;  Laterality: N/A;    Family History  Problem Relation Age of Onset   Breast cancer Mother 25   Prostate cancer Father    Throat cancer Brother     Allergies  Allergen Reactions   Amoxicillin  Hives   Hydrocodone Other (See Comments)    headache   Tizanidine Hcl Other (See Comments)    Itching, headache   Oxycodone Hcl Rash       Latest Ref Rng & Units 11/27/2023    9:36 AM 02/20/2023   10:31 AM 02/14/2022   10:30 AM  CBC  WBC 3.4 - 10.8 x10E3/uL 5.5  5.3  5.7   Hemoglobin 11.1 - 15.9 g/dL 16.1  09.6  04.5   Hematocrit 34.0 - 46.6 % 42.3  43.0  40.7   Platelets 150 - 450 x10E3/uL 187  176  191       CMP     Component Value Date/Time   NA 145 (H) 11/27/2023 0936   K 4.5 11/27/2023 0936   CL 102 11/27/2023 0936   CO2 25 11/27/2023 0936   GLUCOSE 78 11/27/2023 0936   GLUCOSE 85 08/08/2023 0923   BUN 11 11/27/2023 0936   CREATININE 0.89 11/27/2023 0936   CALCIUM 9.5 11/27/2023 0936   PROT 6.9 11/27/2023 0936   ALBUMIN 4.3 11/27/2023 0936   AST 15 11/27/2023 0936    ALT 12 11/27/2023 0936   ALKPHOS 66 11/27/2023 0936   BILITOT 0.4 11/27/2023 0936   EGFR 70 11/27/2023 0936   GFRNONAA >60 08/08/2023 0923     No results found.     Assessment & Plan:   1. Varicose veins of  left lower extremity with pain (Primary) Recommend:  The patient has had successful ablation of the previously incompetent saphenous venous system but still has persistent symptoms of pain and swelling that are having a negative impact on daily life and daily activities.  Patient should undergo injection sclerotherapy to treat the residual varicosities.  The risks, benefits and alternative therapies were reviewed in detail with the patient.  All questions were answered.  The patient agrees to proceed with sclerotherapy at their convenience.  The patient will continue wearing the graduated compression stockings and using the over-the-counter pain medications to treat her symptoms.      2. Essential hypertension Continue antihypertensive medications as already ordered, these medications have been reviewed and there are no changes at this time.   Current Outpatient Medications on File Prior to Visit  Medication Sig Dispense Refill   amLODipine  (NORVASC ) 2.5 MG tablet Take 1 tablet (2.5 mg total) by mouth daily. 90 tablet 1   Ascorbic Acid (VITAMIN C) 500 MG CAPS Take 1,000 mg by mouth.     aspirin EC 81 MG tablet Take 81 mg by mouth daily as needed (learned that it is good to take everyday).     azelastine (ASTELIN) 0.1 % nasal spray SMARTSIG:1-2 Spray(s) Both Nares Twice Daily     Cholecalciferol (VITAMIN D ) 2000 units tablet Take 2,000 Units by mouth daily.     cyclobenzaprine  (FLEXERIL ) 10 MG tablet TAKE 1 TABLET AT BEDTIME 30 tablet 11   ibandronate (BONIVA) 150 MG tablet Take 1 tablet by mouth every 30 (thirty) days.     simvastatin (ZOCOR) 10 MG tablet Take 10 mg by mouth at bedtime.  11   solifenacin (VESICARE) 5 MG tablet Take 5 mg by mouth daily.     No current  facility-administered medications on file prior to visit.    There are no Patient Instructions on file for this visit. No follow-ups on file.   Elyon Zoll E Orly Quimby, NP

## 2024-01-08 NOTE — Telephone Encounter (Signed)
 Please see above message and schedule her an earlier appt with Dr. Gala Jubilee please.  Thank you, Jona Erkkila

## 2024-01-08 NOTE — Telephone Encounter (Signed)
 Spoke with patient and informed her she wants to know if you can see her sooner than June 5?

## 2024-01-11 ENCOUNTER — Other Ambulatory Visit: Payer: Self-pay | Admitting: Internal Medicine

## 2024-01-11 ENCOUNTER — Telehealth: Payer: Self-pay | Admitting: Internal Medicine

## 2024-01-11 NOTE — Telephone Encounter (Signed)
 Copied from CRM [id number]. Topic: Clinical - Medication Refill >> Jan 11, 2024  3:50 PM Ivette P wrote: Most Recent Primary Care Visit:  Provider: Sheron Dixons  Department: [facility] [place]  Visit Type: OFFICE VISIT  Date: 12/26/2023  Medication: omeprazole 20 mg    Has the patient contacted their pharmacy? Yes (Agent: If no, request that the patient contact the pharmacy for the refill. If patient does not wish to contact the pharmacy document the reason why and proceed with request.) (Agent: If yes, when and what did the pharmacy advise?)  Is this the correct pharmacy for this prescription? Yes If no, delete pharmacy and type the correct one.  This is the patient's preferred pharmacy:  [facility] - [place], [place] - [address] [place] [address] Phone: [phone] Fax: [phone]   Has the prescription been filled recently? No, 10/19/2023  Is the patient out of the medication? Yes  Has the patient been seen for an appointment in the last year OR does the patient have an upcoming appointment? Yes  Can we respond through MyChart? Yes  Agent: Please be advised that Rx refills may take up to 3 business days. We ask that you follow-up with your pharmacy.

## 2024-01-11 NOTE — Telephone Encounter (Signed)
 Requested medications are due for refill today.  unsure  Requested medications are on the active medications list.  yes  Last refill. 01/11/2024   Future visit scheduled.   yes  Notes to clinic.  Medication listed as historical.    Requested Prescriptions  Pending Prescriptions Disp Refills   omeprazole (PRILOSEC) 20 MG capsule 30 capsule 1    Sig: Take 1 capsule (20 mg total) by mouth daily.     Gastroenterology: Proton Pump Inhibitors Passed - 01/11/2024  5:17 PM      Passed - Valid encounter within last 12 months    Recent Outpatient Visits           2 weeks ago Essential hypertension   [place] [facility] Medicine at [facility], Chales [place], MD   1 month ago Essential hypertension   [place] [facility] Medicine at [facility], Chales [place], MD       Future Appointments             In 1 month Gala Jubilee Chales [place], MD [facility] Primary Care & [facility] Medicine at [facility], [facility]   In 2 months Gala Jubilee Chales [place], MD [facility] Primary Care & Sports Medicine at [facility], [facility]            Signed Prescriptions Disp Refills   omeprazole (PRILOSEC) 20 MG capsule      Sig: Take 20 mg by mouth daily.     There is no refill protocol information for this order

## 2024-01-14 MED ORDER — OMEPRAZOLE 20 MG PO CPDR: 20.0000 mg | DELAYED_RELEASE_CAPSULE | Freq: Every day | ORAL | 1 refills | Status: AC

## 2024-01-14 NOTE — Telephone Encounter (Signed)
 Please review.  KP

## 2024-01-21 ENCOUNTER — Telehealth (INDEPENDENT_AMBULATORY_CARE_PROVIDER_SITE_OTHER): Payer: Self-pay

## 2024-01-21 NOTE — Telephone Encounter (Signed)
 Christine Ray- Patient agreed to do sclero because of the pain, she also stated she will take the ibuprofen. Christine Ray- She would like to know where is with the scheduling of the sclerotherapy  Please advise

## 2024-01-21 NOTE — Telephone Encounter (Signed)
 If the pain has been ongoing before her laser, I suspect the cause isn't related to her varicose veins.  It may be the knee itself.  Alternatively we discussed sclerotherapy for her residual veins (if that is the cause) and they will reach out to schedule her.  She can try ibuprofen.  Also, she may wish to see PCP to evaluate knee

## 2024-01-21 NOTE — Telephone Encounter (Signed)
 Patient left lower laser on 12/07/23. Patient his having swelling under knee possible insertion site. Achy pain, pain has been going since before the surgery but it has been continuing. She has been elevating and her wearing hose, also she has been taking  Tylenol, but it hasn't been working. Someday's it feels like a 8 other days like a 20.   Please advise

## 2024-01-25 ENCOUNTER — Telehealth (INDEPENDENT_AMBULATORY_CARE_PROVIDER_SITE_OTHER): Payer: Self-pay | Admitting: Nurse Practitioner

## 2024-01-25 NOTE — Telephone Encounter (Signed)
 LVM for pt TCB and schedule saline sclero appt wityh Fallon Brown, NP. X 3  left leg SALINE sclero. see fb. auth # [id number] exp: 5.19.25 - 8.18.25 3 units approved

## 2024-02-05 ENCOUNTER — Encounter (INDEPENDENT_AMBULATORY_CARE_PROVIDER_SITE_OTHER): Payer: Self-pay

## 2024-02-05 ENCOUNTER — Telehealth: Payer: Self-pay

## 2024-02-05 NOTE — Telephone Encounter (Signed)
 Patient has been made aware.

## 2024-02-05 NOTE — Telephone Encounter (Signed)
 Copied from CRM [id number]. Topic: General - Other >> Feb 05, 2024 11:45 AM Hassie Lint wrote: Reason for CRM: Patient is scheduled for an appointment on 06/19 and would like to confirm if she needs to fast for the appointment.  Patient can be reached at [phone]

## 2024-02-21 ENCOUNTER — Encounter: Payer: Self-pay | Admitting: Internal Medicine

## 2024-02-25 ENCOUNTER — Ambulatory Visit (INDEPENDENT_AMBULATORY_CARE_PROVIDER_SITE_OTHER): Admitting: Nurse Practitioner

## 2024-02-26 ENCOUNTER — Telehealth (INDEPENDENT_AMBULATORY_CARE_PROVIDER_SITE_OTHER): Payer: Self-pay | Admitting: Nurse Practitioner

## 2024-02-26 NOTE — Telephone Encounter (Signed)
 Spoke with pt to R/S saline sclerotherapy appt for tomorrow due to Sharla Davis, NP being out sick. Patient would like to hold off making another appt for now. I advised that we can wait for the first appt on July 10 and if we need to add the "third" visit back on, I can get with insurance and extend the expiration date. Pt likes this plan. Nothing further needed at this time.

## 2024-02-27 ENCOUNTER — Other Ambulatory Visit: Payer: Self-pay | Admitting: Internal Medicine

## 2024-02-27 ENCOUNTER — Ambulatory Visit (INDEPENDENT_AMBULATORY_CARE_PROVIDER_SITE_OTHER): Admitting: Nurse Practitioner

## 2024-02-27 DIAGNOSIS — M5432 Sciatica, left side: Secondary | ICD-10-CM

## 2024-02-28 NOTE — Telephone Encounter (Signed)
 Requested medications are due for refill today.  no  Requested medications are on the active medications list.  no  Last refill. 05/10/2023 #[id number]  Future visit scheduled.   yes  Notes to clinic.  Refill/refusal not delegated.    Requested Prescriptions  Pending Prescriptions Disp Refills   cyclobenzaprine  (FLEXERIL ) 10 MG tablet [Pharmacy Med Name: Cyclobenzaprine  HCl Oral Tablet 10 MG] 90 tablet 3    Sig: TAKE 1 TABLET AT BEDTIME     Not Delegated - Analgesics:  Muscle Relaxants Failed - 02/28/2024  2:14 PM      Failed - This refill cannot be delegated      Passed - Valid encounter within last 6 months    Recent Outpatient Visits           2 months ago Essential hypertension   [place] [facility] Medicine at [facility], Chales [place], MD   3 months ago Essential hypertension   [place] [facility] Medicine at [facility], Chales [place], MD       Future Appointments             In 1 week Gala Jubilee, Ch[place], MD [facility] Primary Care & Sports Medicine at [facility], [facility]   In 1 month Gala Jubilee, [place], MD [facility] Primary Care & Sports Medicine at [facility], [facility]

## 2024-02-28 NOTE — Telephone Encounter (Signed)
Please review medication refill  request

## 2024-03-06 ENCOUNTER — Ambulatory Visit (INDEPENDENT_AMBULATORY_CARE_PROVIDER_SITE_OTHER): Admitting: Internal Medicine

## 2024-03-06 ENCOUNTER — Encounter: Payer: Self-pay | Admitting: Internal Medicine

## 2024-03-06 VITALS — BP 122/70 | HR 78 | Ht 71.0 in | Wt 212.0 lb

## 2024-03-06 DIAGNOSIS — I10 Essential (primary) hypertension: Secondary | ICD-10-CM | POA: Diagnosis not present

## 2024-03-06 DIAGNOSIS — M818 Other osteoporosis without current pathological fracture: Secondary | ICD-10-CM

## 2024-03-06 DIAGNOSIS — E559 Vitamin D deficiency, unspecified: Secondary | ICD-10-CM | POA: Diagnosis not present

## 2024-03-06 DIAGNOSIS — D485 Neoplasm of uncertain behavior of skin: Secondary | ICD-10-CM

## 2024-03-06 DIAGNOSIS — K219 Gastro-esophageal reflux disease without esophagitis: Secondary | ICD-10-CM | POA: Diagnosis not present

## 2024-03-06 DIAGNOSIS — Z131 Encounter for screening for diabetes mellitus: Secondary | ICD-10-CM

## 2024-03-06 DIAGNOSIS — K5901 Slow transit constipation: Secondary | ICD-10-CM

## 2024-03-06 DIAGNOSIS — Z0000 Encounter for general adult medical examination without abnormal findings: Secondary | ICD-10-CM | POA: Diagnosis not present

## 2024-03-06 DIAGNOSIS — E782 Mixed hyperlipidemia: Secondary | ICD-10-CM | POA: Diagnosis not present

## 2024-03-06 MED ORDER — SIMVASTATIN 10 MG PO TABS: 10.0000 mg | ORAL_TABLET | Freq: Every day | ORAL | 3 refills | Status: DC

## 2024-03-06 NOTE — Assessment & Plan Note (Signed)
 LDL is  Lab Results  Component Value Date   LDLCALC 120 (H) 08/08/2023   Current regimen is simvastatin.  No medication side effects noted. Goal LDL is <100.

## 2024-03-06 NOTE — Assessment & Plan Note (Signed)
 On daily supplement due to low levels and osteoporosis.

## 2024-03-06 NOTE — Patient Instructions (Signed)
 Take a stool softener like Colace daily to regulate your bowel movements.

## 2024-03-06 NOTE — Progress Notes (Signed)
 Date:  03/06/2024   Name:  Christine Ray   DOB:  1955/11/24   MRN:  [id number]   Chief Complaint: Annual Exam and Skin Spot (Right lower leg. X 3 weeks. Not painful or itchy. Patient is calling it a rash but its dark, and odd shaped. Growing in size.) Christine Ray is a 68 y.o. female who presents today for her Complete Annual Exam. She feels fairly well. She reports exercising. She reports she is sleeping well. Breast complaints - none.  Health Maintenance  Topic Date Due   COVID-19 Vaccine (7 - Moderna risk 2024-25 season) 02/29/2024   Flu Shot  04/18/2024   Medicare Annual Wellness Visit  05/14/2024   Mammogram  10/03/2024   Colon Cancer Screening  11/08/2026   DTaP/Tdap/Td vaccine (2 - Td or Tdap) 06/11/2028   Pneumococcal Vaccine for age over 17  Completed   DEXA scan (bone density measurement)  Completed   Hepatitis C Screening  Completed   Zoster (Shingles) Vaccine  Completed   HPV Vaccine  Aged Out   Meningitis B Vaccine  Aged Out    Hypertension This is a chronic problem. The problem is controlled. Pertinent negatives include no chest pain, headaches, palpitations or shortness of breath. Past treatments include calcium channel blockers. There is no history of kidney disease, CAD/MI or CVA.  Gastroesophageal Reflux She complains of heartburn. She reports no abdominal pain, no chest pain, no coughing or no wheezing. The problem occurs occasionally. Pertinent negatives include no fatigue.  Hyperlipidemia This is a chronic problem. The problem is controlled. Pertinent negatives include no chest pain, myalgias or shortness of breath. Current antihyperlipidemic treatment includes statins.  Skin lesion - on right lower leg - started several weeks ago but is getting larger.  No pain, itching or bleeding.  She thinks she has similar lesion about 2 years ago that resolved on its own.    Review of Systems  Constitutional:  Negative for fatigue and unexpected weight change.   HENT:  Negative for trouble swallowing.   Eyes:  Negative for visual disturbance.  Respiratory:  Negative for cough, chest tightness, shortness of breath and wheezing.   Cardiovascular:  Negative for chest pain, palpitations and leg swelling.  Gastrointestinal:  Positive for heartburn. Negative for abdominal pain, constipation and diarrhea.  Musculoskeletal:  Negative for arthralgias and myalgias.  Neurological:  Negative for dizziness, weakness, light-headedness and headaches.     Lab Results  Component Value Date   NA 145 (H) 11/27/2023   K 4.5 11/27/2023   CO2 25 11/27/2023   GLUCOSE 78 11/27/2023   BUN 11 11/27/2023   CREATININE 0.89 11/27/2023   CALCIUM 9.5 11/27/2023   EGFR 70 11/27/2023   GFRNONAA >60 08/08/2023   Lab Results  Component Value Date   CHOL 196 08/08/2023   HDL 58 08/08/2023   LDLCALC 120 (H) 08/08/2023   TRIG 89 08/08/2023   CHOLHDL 3.4 08/08/2023   Lab Results  Component Value Date   TSH 1.000 11/27/2023   No results found for: HGBA1C Lab Results  Component Value Date   WBC 5.5 11/27/2023   HGB 13.6 11/27/2023   HCT 42.3 11/27/2023   MCV 87 11/27/2023   PLT 187 11/27/2023   Lab Results  Component Value Date   ALT 12 11/27/2023   AST 15 11/27/2023   ALKPHOS 66 11/27/2023   BILITOT 0.4 11/27/2023   Lab Results  Component Value Date   VD25OH 35.6 02/14/2022  Patient Active Problem List   Diagnosis Date Noted   Essential hypertension 08/08/2023   Spondylosis without myelopathy or radiculopathy, lumbosacral region 07/27/2023   Arthralgia of right knee 07/27/2023   History of colonic polyps    Polyp of sigmoid colon    Tubular adenoma of colon 10/11/2021   Chronic pain of left knee 04/06/2017   Mixed hyperlipidemia 04/26/2016   Vitamin D  deficiency 04/26/2016   BPPV (benign paroxysmal positional vertigo) 04/26/2016   Varicose veins of both lower extremities 04/26/2016   GERD (gastroesophageal reflux disease) 01/04/2015    Age-related osteoporosis without fracture 01/04/2015    Allergies  Allergen Reactions   Amoxicillin  Hives   Hydrocodone Other (See Comments)    headache   Tizanidine Hcl Other (See Comments)    Itching, headache   Oxycodone Hcl Rash    Past Surgical History:  Procedure Laterality Date   COLONOSCOPY WITH PROPOFOL  N/A 11/13/2016   Procedure: COLONOSCOPY WITH PROPOFOL ;  Surgeon: Deveron Fly, MD;  Location: [facility] ENDOSCOPY;  Service: Endoscopy;  Laterality: N/A;   COLONOSCOPY WITH PROPOFOL  N/A 11/08/2021   Procedure: COLONOSCOPY WITH PROPOFOL ;  Surgeon: Selena Daily, MD;  Location: [facility] ENDOSCOPY;  Service: Gastroenterology;  Laterality: N/A;    Social History   Tobacco Use   Smoking status: Former    Current packs/day: 0.00    Types: Cigarettes    Quit date: 10/14/1975    Years since quitting: 48.4   Smokeless tobacco: Never  Vaping Use   Vaping status: Never Used  Substance Use Topics   Alcohol use: No   Drug use: No     Medication list has been reviewed and updated.  Current Meds  Medication Sig   amLODipine  (NORVASC ) 2.5 MG tablet Take 1 tablet (2.5 mg total) by mouth daily.   Ascorbic Acid (VITAMIN C) 500 MG CAPS Take 1,000 mg by mouth.   aspirin EC 81 MG tablet Take 81 mg by mouth daily as needed (learned that it is good to take everyday).   azelastine (ASTELIN) 0.1 % nasal spray SMARTSIG:1-2 Spray(s) Both Nares Twice Daily   Cholecalciferol (VITAMIN D ) 2000 units tablet Take 2,000 Units by mouth daily.   cyclobenzaprine  (FLEXERIL ) 10 MG tablet TAKE 1 TABLET AT BEDTIME   ibandronate (BONIVA) 150 MG tablet Take 1 tablet by mouth every 30 (thirty) days.   omeprazole  (PRILOSEC) 20 MG capsule Take 1 capsule (20 mg total) by mouth daily.   solifenacin (VESICARE) 5 MG tablet Take 5 mg by mouth daily.   [DISCONTINUED] simvastatin (ZOCOR) 10 MG tablet Take 10 mg by mouth at bedtime.       03/06/2024    8:12 AM 12/26/2023   11:02 AM 11/27/2023    9:06 AM  08/27/2023    9:21 AM  GAD 7 : Generalized Anxiety Score  Nervous, Anxious, on Edge 0 0 0 0  Control/stop worrying 0 0 0 0  Worry too much - different things 0 0 0 0  Trouble relaxing 0 0 1 0  Restless 0 0 0 0  Easily annoyed or irritable 0 0 0 0  Afraid - awful might happen 0 0 0 0  Total GAD 7 Score 0 0 1 0  Anxiety Difficulty Not difficult at all Not difficult at all Somewhat difficult Not difficult at all       03/06/2024    8:12 AM 12/26/2023   11:02 AM 11/27/2023    9:06 AM  Depression screen PHQ 2/9  Decreased Interest 0 1  0  Down, Depressed, Hopeless 0 0 0  PHQ - 2 Score 0 1 0  Altered sleeping 0 0 0  Tired, decreased energy 0 0 1  Change in appetite 0 0 0  Feeling bad or failure about yourself  0 0 0  Trouble concentrating 0 0 0  Moving slowly or fidgety/restless 0 0 0  Suicidal thoughts 0 0 0  PHQ-9 Score 0 1 1  Difficult doing work/chores Not difficult at all Not difficult at all Somewhat difficult    BP Readings from Last 3 Encounters:  03/06/24 122/70  01/07/24 127/82  12/26/23 126/76    Physical Exam Vitals and nursing note reviewed.  Constitutional:      General: She is not in acute distress.    Appearance: She is well-developed.  HENT:     Head: Normocephalic and atraumatic.     Right Ear: Tympanic membrane and ear canal normal.     Left Ear: Tympanic membrane and ear canal normal.     Nose:     Right Sinus: No maxillary sinus tenderness.     Left Sinus: No maxillary sinus tenderness.   Eyes:     General: No scleral icterus.       Right eye: No discharge.        Left eye: No discharge.     Conjunctiva/sclera: Conjunctivae normal.   Neck:     Thyroid : No thyromegaly.     Vascular: No carotid bruit.   Cardiovascular:     Rate and Rhythm: Normal rate and regular rhythm.     Pulses: Normal pulses.     Heart sounds: Normal heart sounds.  Pulmonary:     Effort: Pulmonary effort is normal. No respiratory distress.     Breath sounds: No  wheezing.  Abdominal:     General: Bowel sounds are normal.     Palpations: Abdomen is soft.     Tenderness: There is no abdominal tenderness.   Musculoskeletal:     Cervical back: Normal range of motion. No erythema.     Right lower leg: No edema.     Left lower leg: No edema.  Lymphadenopathy:     Cervical: No cervical adenopathy.   Skin:    General: Skin is warm and dry.     Findings: No rash.   Neurological:     Mental Status: She is alert and oriented to person, place, and time.     Cranial Nerves: No cranial nerve deficit.     Sensory: No sensory deficit.     Deep Tendon Reflexes: Reflexes are normal and symmetric.   Psychiatric:        Attention and Perception: Attention normal.        Mood and Affect: Mood normal.     Wt Readings from Last 3 Encounters:  03/06/24 212 lb (96.2 kg)  01/07/24 211 lb 6.4 oz (95.9 kg)  12/26/23 211 lb (95.7 kg)    BP 122/70   Pulse 78   Ht 5' 11 (1.803 m)   Wt 212 lb (96.2 kg)   SpO2 97%   BMI 29.57 kg/m   Assessment and Plan:  Problem List Items Addressed This Visit       Unprioritized   Vitamin D  deficiency (Chronic)   On daily supplement due to low levels and osteoporosis.      Relevant Orders   VITAMIN D  25 Hydroxy (Vit-D Deficiency, Fractures)   Essential hypertension (Chronic)   Blood pressure is well controlled. Mild  ankle edema is improving with compression stockings. Current medications amlodipine   Will continue same regimen along with efforts to limit dietary sodium.       Relevant Medications   simvastatin (ZOCOR) 10 MG tablet   Other Relevant Orders   Comprehensive metabolic panel with GFR   TSH   Urinalysis, Routine w reflex microscopic   GERD (gastroesophageal reflux disease)   Reflux symptoms are controlled on omeprazole  prn. Patient denies red flag symptoms - no melena, weight loss, dysphagia.       Age-related osteoporosis without fracture   Doing well on Boniva monthly prescribed by  GYN      Mixed hyperlipidemia   LDL is  Lab Results  Component Value Date   LDLCALC 120 (H) 08/08/2023   Current regimen is simvastatin.  No medication side effects noted. Goal LDL is <100.       Relevant Medications   simvastatin (ZOCOR) 10 MG tablet   Other Relevant Orders   Lipid panel   Other Visit Diagnoses       Annual physical exam    -  Primary   up to date on screenings and immunizations.     Neoplasm of uncertain behavior of skin       Relevant Orders   Ambulatory referral to Dermatology     Screening for diabetes mellitus       Relevant Orders   Hemoglobin A1c     Slow transit constipation       intermittent symptoms advise Colace with sufficient fluids daily   Relevant Orders   TSH       Return in about 6 months (around 09/05/2024) for HTN.    Sheron Dixons, MD [facility] [place]

## 2024-03-06 NOTE — Assessment & Plan Note (Signed)
 Doing well on Boniva monthly prescribed by GYN

## 2024-03-06 NOTE — Assessment & Plan Note (Signed)
 Reflux symptoms are controlled on omeprazole  prn. Patient denies red flag symptoms - no melena, weight loss, dysphagia.

## 2024-03-06 NOTE — Assessment & Plan Note (Addendum)
 Blood pressure is well controlled. Mild ankle edema is improving with compression stockings. Current medications amlodipine   Will continue same regimen along with efforts to limit dietary sodium.

## 2024-03-07 ENCOUNTER — Ambulatory Visit: Payer: Self-pay | Admitting: Internal Medicine

## 2024-03-07 ENCOUNTER — Other Ambulatory Visit: Payer: Self-pay | Admitting: Internal Medicine

## 2024-03-07 DIAGNOSIS — E782 Mixed hyperlipidemia: Secondary | ICD-10-CM

## 2024-03-07 DIAGNOSIS — R3121 Asymptomatic microscopic hematuria: Secondary | ICD-10-CM

## 2024-03-07 LAB — URINALYSIS, ROUTINE W REFLEX MICROSCOPIC
Bilirubin, UA: NEGATIVE
Glucose, UA: NEGATIVE
Ketones, UA: NEGATIVE
Leukocytes,UA: NEGATIVE
Nitrite, UA: NEGATIVE
Specific Gravity, UA: 1.026 (ref 1.005–1.030)
Urobilinogen, Ur: 0.2 mg/dL (ref 0.2–1.0)
pH, UA: 6 (ref 5.0–7.5)

## 2024-03-07 LAB — COMPREHENSIVE METABOLIC PANEL WITH GFR
ALT: 14 IU/L (ref 0–32)
AST: 17 IU/L (ref 0–40)
Albumin: 4.3 g/dL (ref 3.9–4.9)
Alkaline Phosphatase: 80 IU/L (ref 44–121)
BUN/Creatinine Ratio: 18 (ref 12–28)
BUN: 15 mg/dL (ref 8–27)
Bilirubin Total: 0.5 mg/dL (ref 0.0–1.2)
CO2: 26 mmol/L (ref 20–29)
Calcium: 9.4 mg/dL (ref 8.7–10.3)
Chloride: 102 mmol/L (ref 96–106)
Creatinine, Ser: 0.85 mg/dL (ref 0.57–1.00)
Globulin, Total: 2.7 g/dL (ref 1.5–4.5)
Glucose: 70 mg/dL (ref 70–99)
Potassium: 4.7 mmol/L (ref 3.5–5.2)
Sodium: 140 mmol/L (ref 134–144)
Total Protein: 7 g/dL (ref 6.0–8.5)
eGFR: 74 mL/min/{1.73_m2} (ref >59)

## 2024-03-07 LAB — LIPID PANEL
Chol/HDL Ratio: 3.4 ratio (ref 0.0–4.4)
Cholesterol, Total: 192 mg/dL (ref 100–199)
HDL: 57 mg/dL (ref >39)
LDL Chol Calc (NIH): 118 mg/dL — ABNORMAL HIGH (ref 0–99)
Triglycerides: 95 mg/dL (ref 0–149)
VLDL Cholesterol Cal: 17 mg/dL (ref 5–40)

## 2024-03-07 LAB — MICROSCOPIC EXAMINATION: Casts: NONE SEEN /LPF

## 2024-03-07 LAB — VITAMIN D 25 HYDROXY (VIT D DEFICIENCY, FRACTURES): Vit D, 25-Hydroxy: 34.4 ng/mL (ref 30.0–100.0)

## 2024-03-07 LAB — HEMOGLOBIN A1C
Est. average glucose Bld gHb Est-mCnc: 114 mg/dL
Hgb A1c MFr Bld: 5.6 % (ref 4.8–5.6)

## 2024-03-07 LAB — TSH: TSH: 1.3 u[IU]/mL (ref 0.450–4.500)

## 2024-03-07 MED ORDER — SIMVASTATIN 20 MG PO TABS: 20.0000 mg | ORAL_TABLET | Freq: Every day | ORAL | 1 refills | Status: DC

## 2024-03-07 NOTE — Progress Notes (Unsigned)
 Date:  03/07/2024   Name:  Christine Ray   DOB:  11-Mar-1956   MRN:  [id number]   Chief Complaint: No chief complaint on file.  HPI  Review of Systems   Lab Results  Component Value Date   NA 140 03/06/2024   K 4.7 03/06/2024   CO2 26 03/06/2024   GLUCOSE 70 03/06/2024   BUN 15 03/06/2024   CREATININE 0.85 03/06/2024   CALCIUM 9.4 03/06/2024   EGFR 74 03/06/2024   GFRNONAA >60 08/08/2023   Lab Results  Component Value Date   CHOL 192 03/06/2024   HDL 57 03/06/2024   LDLCALC 118 (H) 03/06/2024   TRIG 95 03/06/2024   CHOLHDL 3.4 03/06/2024   Lab Results  Component Value Date   TSH 1.300 03/06/2024   Lab Results  Component Value Date   HGBA1C 5.6 03/06/2024   Lab Results  Component Value Date   WBC 5.5 11/27/2023   HGB 13.6 11/27/2023   HCT 42.3 11/27/2023   MCV 87 11/27/2023   PLT 187 11/27/2023   Lab Results  Component Value Date   ALT 14 03/06/2024   AST 17 03/06/2024   ALKPHOS 80 03/06/2024   BILITOT 0.5 03/06/2024   Lab Results  Component Value Date   VD25OH 34.4 03/06/2024     Patient Active Problem List   Diagnosis Date Noted   Essential hypertension 08/08/2023   Spondylosis without myelopathy or radiculopathy, lumbosacral region 07/27/2023   Arthralgia of right knee 07/27/2023   History of colonic polyps    Polyp of sigmoid colon    Tubular adenoma of colon 10/11/2021   Chronic pain of left knee 04/06/2017   Mixed hyperlipidemia 04/26/2016   Vitamin D  deficiency 04/26/2016   BPPV (benign paroxysmal positional vertigo) 04/26/2016   Varicose veins of both lower extremities 04/26/2016   GERD (gastroesophageal reflux disease) 01/04/2015   Age-related osteoporosis without fracture 01/04/2015    Allergies  Allergen Reactions   Amoxicillin  Hives   Hydrocodone Other (See Comments)    headache   Tizanidine Hcl Other (See Comments)    Itching, headache   Oxycodone Hcl Rash    Past Surgical History:  Procedure Laterality Date    COLONOSCOPY WITH PROPOFOL  N/A 11/13/2016   Procedure: COLONOSCOPY WITH PROPOFOL ;  Surgeon: Deveron Fly, MD;  Location: [facility] ENDOSCOPY;  Service: Endoscopy;  Laterality: N/A;   COLONOSCOPY WITH PROPOFOL  N/A 11/08/2021   Procedure: COLONOSCOPY WITH PROPOFOL ;  Surgeon: Selena Daily, MD;  Location: [facility] ENDOSCOPY;  Service: Gastroenterology;  Laterality: N/A;    Social History   Tobacco Use   Smoking status: Former    Current packs/day: 0.00    Types: Cigarettes    Quit date: 10/14/1975    Years since quitting: 48.4   Smokeless tobacco: Never  Vaping Use   Vaping status: Never Used  Substance Use Topics   Alcohol use: No   Drug use: No     Medication list has been reviewed and updated.  No outpatient medications have been marked as taking for the 03/07/24 encounter (Orders Only) with Sheron Dixons, MD.       03/06/2024    8:12 AM 12/26/2023   11:02 AM 11/27/2023    9:06 AM 08/27/2023    9:21 AM  GAD 7 : Generalized Anxiety Score  Nervous, Anxious, on Edge 0 0 0 0  Control/stop worrying 0 0 0 0  Worry too much - different things 0 0 0 0  Trouble relaxing  0 0 1 0  Restless 0 0 0 0  Easily annoyed or irritable 0 0 0 0  Afraid - awful might happen 0 0 0 0  Total GAD 7 Score 0 0 1 0  Anxiety Difficulty Not difficult at all Not difficult at all Somewhat difficult Not difficult at all       03/06/2024    8:12 AM 12/26/2023   11:02 AM 11/27/2023    9:06 AM  Depression screen PHQ 2/9  Decreased Interest 0 1 0  Down, Depressed, Hopeless 0 0 0  PHQ - 2 Score 0 1 0  Altered sleeping 0 0 0  Tired, decreased energy 0 0 1  Change in appetite 0 0 0  Feeling bad or failure about yourself  0 0 0  Trouble concentrating 0 0 0  Moving slowly or fidgety/restless 0 0 0  Suicidal thoughts 0 0 0  PHQ-9 Score 0 1 1  Difficult doing work/chores Not difficult at all Not difficult at all Somewhat difficult    BP Readings from Last 3 Encounters:  03/06/24 122/70  01/07/24 127/82   12/26/23 126/76    Physical Exam  Wt Readings from Last 3 Encounters:  03/06/24 212 lb (96.2 kg)  01/07/24 211 lb 6.4 oz (95.9 kg)  12/26/23 211 lb (95.7 kg)    There were no vitals taken for this visit.  Assessment and Plan:  Problem List Items Addressed This Visit   None   No follow-ups on file.    Sheron Dixons, MD [facility] [place]

## 2024-03-07 NOTE — Progress Notes (Signed)
 Called Pt. Pt is aware.  Christine Ray.

## 2024-03-13 ENCOUNTER — Encounter: Payer: Self-pay | Admitting: Dermatology

## 2024-03-13 ENCOUNTER — Ambulatory Visit: Admitting: Dermatology

## 2024-03-13 DIAGNOSIS — L821 Other seborrheic keratosis: Secondary | ICD-10-CM | POA: Diagnosis not present

## 2024-03-13 DIAGNOSIS — D229 Melanocytic nevi, unspecified: Secondary | ICD-10-CM

## 2024-03-13 DIAGNOSIS — L918 Other hypertrophic disorders of the skin: Secondary | ICD-10-CM | POA: Diagnosis not present

## 2024-03-13 DIAGNOSIS — D492 Neoplasm of unspecified behavior of bone, soft tissue, and skin: Secondary | ICD-10-CM | POA: Diagnosis not present

## 2024-03-13 DIAGNOSIS — D489 Neoplasm of uncertain behavior, unspecified: Secondary | ICD-10-CM

## 2024-03-13 DIAGNOSIS — D225 Melanocytic nevi of trunk: Secondary | ICD-10-CM | POA: Diagnosis not present

## 2024-03-13 DIAGNOSIS — L820 Inflamed seborrheic keratosis: Secondary | ICD-10-CM | POA: Diagnosis not present

## 2024-03-13 NOTE — Progress Notes (Signed)
 New Patient Visit   Subjective  Christine Ray is a 68 y.o. female who presents for the following: rash at right leg she has had for some time. Patient states she was prescribed cream years ago and it helped. History of eczema.   Has a darkened spot at right ankle area.   Patient also has moles she would like checked on chest and back  The patient has spots, moles and lesions to be evaluated, some may be new or changing and the patient may have concern these could be cancer.   The following portions of the chart were reviewed this encounter and updated as appropriate: medications, allergies, medical history  Review of Systems:  No other skin or systemic complaints except as noted in HPI or Assessment and Plan.  Objective  Well appearing patient in no apparent distress; mood and affect are within normal limits.   A focused examination was performed of the following areas: Right lower leg, chest, back, b/l arms  Relevant exam findings are noted in the Assessment and Plan.  right lower leg 3.3 cm x 2.2 cm brown keratotic plaque   right lower leg x 1 Erythematous stuck-on, waxy papule or plaque  Assessment & Plan   SEBORRHEIC KERATOSIS At legs, chest  - Stuck-on, waxy, tan-brown papules and/or plaques  - Benign-appearing - Discussed benign etiology and prognosis. - Observe - Call for any changes  Acrochordons (Skin Tags) - Fleshy, skin-colored pedunculated papules - Benign appearing.  - Observe. - If desired, they can be removed with an in office procedure that is not covered by insurance. - Please call the clinic if you notice any new or changing lesions.  MELANOCYTIC NEVI At left chest Exam: Tan-brown and/or pink-flesh-colored symmetric macules and papules  Treatment Plan: Benign appearing on exam today. Recommend observation. Call clinic for new or changing moles. Recommend daily use of broad spectrum spf 30+ sunscreen to sun-exposed areas.    NEOPLASM OF  UNCERTAIN BEHAVIOR right lower leg Skin / nail biopsy Type of biopsy: tangential   Informed consent: discussed and consent obtained   Timeout: patient name, date of birth, surgical site, and procedure verified   Procedure prep:  Patient was prepped and draped in usual sterile fashion Prep type:  Isopropyl alcohol Anesthesia: the lesion was anesthetized in a standard fashion   Anesthetic:  1% lidocaine  w/ epinephrine 1-100,000 buffered w/ 8.4% NaHCO3 Instrument used: DermaBlade   Hemostasis achieved with: pressure and aluminum chloride   Outcome: patient tolerated procedure well   Post-procedure details: sterile dressing applied and wound care instructions given   Dressing type: bandage and petrolatum   Specimen 1 - Surgical pathology Differential Diagnosis: r/o seborrheic keratosis   Check Margins: No R/o seborrheic keratosis  INFLAMED SEBORRHEIC KERATOSIS right lower leg x 1 Symptomatic, irritating, patient would like treated.  No charge - treated with Ln2 today free as a test  Destruction of lesion - right lower leg x 1 Complexity: simple   Destruction method: cryotherapy   Informed consent: discussed and consent obtained   Timeout:  patient name, date of birth, surgical site, and procedure verified Lesion destroyed using liquid nitrogen: Yes   Region frozen until ice ball extended beyond lesion: Yes   Cryo cycles: 1 or 2. Outcome: patient tolerated procedure well with no complications   Post-procedure details: wound care instructions given    Return if symptoms worsen or fail to improve.  I, Eleanor Blush, CMA, am acting as scribe for Boneta Sharps, MD.  Documentation: I have reviewed the above documentation for accuracy and completeness, and I agree with the above.  Boneta Sharps, MD

## 2024-03-13 NOTE — Patient Instructions (Addendum)
 Biopsy Wound Care Instructions  Leave the original bandage on for 24 hours if possible.  If the bandage becomes soaked or soiled before that time, it is OK to remove it and examine the wound.  A small amount of post-operative bleeding is normal.  If excessive bleeding occurs, remove the bandage, place gauze over the site and apply continuous pressure (no peeking) over the area for 30 minutes. If this does not work, please call our clinic as soon as possible or page your doctor if it is after hours.   Once a day, cleanse the wound with soap and water. It is fine to shower. If a thick crust develops you may use a Q-tip dipped into dilute hydrogen peroxide (mix 1:1 with water) to dissolve it.  Hydrogen peroxide can slow the healing process, so use it only as needed.    After washing, apply petroleum jelly (Vaseline) or an antibiotic ointment if your doctor prescribed one for you, followed by a bandage.    For best healing, the wound should be covered with a layer of ointment at all times. If you are not able to keep the area covered with a bandage to hold the ointment in place, this may mean re-applying the ointment several times a day.  Continue this wound care until the wound has healed and is no longer open.   Itching and mild discomfort is normal during the healing process. However, if you develop pain or severe itching, please call our office.   If you have any discomfort, you can take Tylenol (acetaminophen) or ibuprofen as directed on the bottle. (Please do not take these if you have an allergy to them or cannot take them for another reason).  Some redness, tenderness and white or yellow material in the wound is normal healing.  If the area becomes very sore and red, or develops a thick yellow-green material (pus), it may be infected; please notify us .    If you have stitches, return to clinic as directed to have the stitches removed. You will continue wound care for 2-3 days after the stitches  are removed.   Wound healing continues for up to one year following surgery. It is not unusual to experience pain in the scar from time to time during the interval.  If the pain becomes severe or the scar thickens, you should notify the office.    A slight amount of redness in a scar is expected for the first six months.  After six months, the redness will fade and the scar will soften and fade.  The color difference becomes less noticeable with time.  If there are any problems, return for a post-op surgery check at your earliest convenience.  To improve the appearance of the scar, you can use silicone scar gel, cream, or sheets (such as Mederma or Serica) every night for up to one year. These are available over the counter (without a prescription).  Please call our office at [phone] for any questions or concerns.  Cryotherapy Aftercare  Wash gently with soap and water everyday.   Apply Vaseline and Band-Aid daily until healed.      Seborrheic Keratosis  What causes seborrheic keratoses? Seborrheic keratoses are harmless, common skin growths that first appear during adult life.  As time goes by, more growths appear.  Some people may develop a large number of them.  Seborrheic keratoses appear on both covered and uncovered body parts.  They are not caused by sunlight.  The tendency to  develop seborrheic keratoses can be inherited.  They vary in color from skin-colored to gray, brown, or even black.  They can be either smooth or have a rough, warty surface.   Seborrheic keratoses are superficial and look as if they were stuck on the skin.  Under the microscope this type of keratosis looks like layers upon layers of skin.  That is why at times the top layer may seem to fall off, but the rest of the growth remains and re-grows.    Treatment Seborrheic keratoses do not need to be treated, but can easily be removed in the office.  Seborrheic keratoses often cause symptoms when they rub on  clothing or jewelry.  Lesions can be in the way of shaving.  If they become inflamed, they can cause itching, soreness, or burning.  Removal of a seborrheic keratosis can be accomplished by freezing, burning, or surgery. If any spot bleeds, scabs, or grows rapidly, please return to have it checked, as these can be an indication of a skin cancer.   Due to recent changes in healthcare laws, you may see results of your pathology and/or laboratory studies on MyChart before the doctors have had a chance to review them. We understand that in some cases there may be results that are confusing or concerning to you. Please understand that not all results are received at the same time and often the doctors may need to interpret multiple results in order to provide you with the best plan of care or course of treatment. Therefore, we ask that you please give us  2 business days to thoroughly review all your results before contacting the office for clarification. Should we see a critical lab result, you will be contacted sooner.   If You Need Anything After Your Visit  If you have any questions or concerns for your doctor, please call our main line at [phone] and press option 4 to reach your doctor's medical assistant. If no one answers, please leave a voicemail as directed and we will return your call as soon as possible. Messages left after 4 pm will be answered the following business day.   You may also send us  a message via MyChart. We typically respond to MyChart messages within 1-2 business days.  For prescription refills, please ask your pharmacy to contact our office. Our fax number is [phone].  If you have an urgent issue when the clinic is closed that cannot wait until the next business day, you can page your doctor at the number below.    Please note that while we do our best to be available for urgent issues outside of office hours, we are not available 24/7.   If you have an urgent issue  and are unable to reach us , you may choose to seek medical care at your doctor's office, retail clinic, urgent care center, or emergency room.  If you have a medical emergency, please immediately call 911 or go to the emergency department.  Pager Numbers  - Dr. Hester: [phone]  - Dr. Jackquline: [phone]  - Dr. Claudene: [phone]   In the event of inclement weather, please call our main line at [phone] for an update on the status of any delays or closures.  Dermatology Medication Tips: Please keep the boxes that topical medications come in in order to help keep track of the instructions about where and how to use these. Pharmacies typically print the medication instructions only on the boxes and not directly on the medication  tubes.   If your medication is too expensive, please contact our office at [phone] option 4 or send us  a message through MyChart.   We are unable to tell what your co-pay for medications will be in advance as this is different depending on your insurance coverage. However, we may be able to find a substitute medication at lower cost or fill out paperwork to get insurance to cover a needed medication.   If a prior authorization is required to get your medication covered by your insurance company, please allow us  1-2 business days to complete this process.  Drug prices often vary depending on where the prescription is filled and some pharmacies may offer cheaper prices.  The website www.goodrx.com contains coupons for medications through different pharmacies. The prices here do not account for what the cost may be with help from insurance (it may be cheaper with your insurance), but the website can give you the price if you did not use any insurance.  - You can print the associated coupon and take it with your prescription to the pharmacy.  - You may also stop by our office during regular business hours and pick up a GoodRx coupon card.  - If you  need your prescription sent electronically to a different pharmacy, notify our office through [facility] or by phone at [phone] option 4.     Si Usted Necesita Algo Despus de Su Visita  Tambin puede enviarnos un mensaje a travs de Clinical cytogeneticist. Por lo general respondemos a los mensajes de MyChart en el transcurso de 1 a 2 das hbiles.  Para renovar recetas, por favor pida a su farmacia que se ponga en contacto con nuestra oficina. Randi lakes de fax es [place] [phone].  Si tiene un asunto urgente cuando la clnica est cerrada y que no puede esperar hasta el siguiente da hbil, puede llamar/localizar a su doctor(a) al nmero que aparece a continuacin.   Por favor, tenga en cuenta que aunque hacemos todo lo posible para estar disponibles para asuntos urgentes fuera del horario de [place], no estamos disponibles las 24 horas del da, los 7 [address]  [address] de la [place].   Si tiene un problema urgente y no puede comunicarse con nosotros, puede optar por buscar atencin mdica  en el consultorio de su doctor(a), en una clnica privada, en un centro de atencin urgente o en una sala de emergencias.  Si tiene Engineer, drilling, por favor llame inmediatamente al 911 o vaya a la sala de emergencias.  Nmeros de bper  - Dr. Hester: [phone]  - Dra. Jackquline: [phone]  - Dr. Claudene: [phone]   En caso de inclemencias del tiempo, por favor llame a landry capes principal al [phone] para una actualizacin sobre el [place] de cualquier retraso o cierre.  Consejos para la medicacin en dermatologa: Por favor, guarde las cajas en las que vienen los medicamentos de uso tpico para ayudarle a seguir las instrucciones sobre dnde y cmo usarlos. Las farmacias generalmente imprimen las instrucciones del medicamento slo en las cajas y no directamente en los tubos del [place].   Si su medicamento es muy caro, por favor, pngase en contacto con landry rieger llamando al  [phone] y presione la opcin 4 o envenos un mensaje a travs de Clinical cytogeneticist.   No podemos decirle cul ser su copago por los medicamentos por adelantado ya que esto es diferente dependiendo de la cobertura de su seguro. Sin embargo, es posible que podamos encontrar un medicamento sustituto a Adult nurse  costo o llenar un formulario para que el seguro cubra el medicamento que se considera necesario.   Si se requiere una autorizacin previa para que su compaa de seguros malta su medicamento, por favor permtanos de 1 a 2 das hbiles para completar este proceso.  Los precios de los medicamentos varan con frecuencia dependiendo del Environmental consultant de dnde se surte la receta y alguna farmacias pueden ofrecer precios ms baratos.  El sitio web www.goodrx.com tiene cupones para medicamentos de Health and safety inspector. Los precios aqu no tienen en cuenta lo que podra costar con la ayuda del seguro (puede ser ms barato con su seguro), pero el sitio web puede darle el precio si no utiliz Tourist information centre manager.  - Puede imprimir el cupn correspondiente y llevarlo con su receta a la farmacia.  - Tambin puede pasar por nuestra oficina durante el horario de atencin regular y Education officer, museum una tarjeta de cupones de GoodRx.  - Si necesita que su receta se enve electrnicamente a una farmacia diferente, informe a nuestra oficina a travs de MyChart de [place] o por telfono llamando al [phone] y presione la opcin 4.

## 2024-03-14 LAB — SURGICAL PATHOLOGY

## 2024-03-17 ENCOUNTER — Telehealth: Payer: Self-pay | Admitting: Internal Medicine

## 2024-03-17 ENCOUNTER — Ambulatory Visit: Payer: Self-pay | Admitting: Dermatology

## 2024-03-17 NOTE — Telephone Encounter (Signed)
 Copied from CRM [id number]. Topic: General - Other >> Mar 17, 2024  3:12 PM Everette C wrote: Reason for CRM: The patient has called to request a printed copy of their recent lab results. The patient would like for their husband to pick up the document tomorrow 03/18/24  Please contact the patient further if needed

## 2024-03-17 NOTE — Telephone Encounter (Signed)
 Noted  KP

## 2024-03-17 NOTE — Telephone Encounter (Signed)
 Copied from CRM [id number]. Topic: General - Other >> Mar 17, 2024  3:12 PM Everette C wrote: Reason for CRM: The patient has called to request a printed copy of their recent lab results. The patient would like for their husband to pick up the document tomorrow 03/18/24  Please contact the patient further if needed

## 2024-03-20 ENCOUNTER — Encounter: Payer: Self-pay | Admitting: Dermatology

## 2024-03-20 ENCOUNTER — Ambulatory Visit: Admitting: Dermatology

## 2024-03-20 DIAGNOSIS — L820 Inflamed seborrheic keratosis: Secondary | ICD-10-CM

## 2024-03-20 NOTE — Progress Notes (Signed)
   Follow-Up Visit   Subjective  Christine Ray is a 68 y.o. female who presents for the following: Bx proven ISK R lower leg, pt here today for LN2.   The following portions of the chart were reviewed this encounter and updated as appropriate: medications, allergies, medical history  Review of Systems:  No other skin or systemic complaints except as noted in HPI or Assessment and Plan.  Objective  Well appearing patient in no apparent distress; mood and affect are within normal limits.   A focused examination was performed of the following areas: the legs   Relevant exam findings are noted in the Assessment and Plan.  R lower leg Erythematous stuck-on, waxy papule or plaque  Assessment & Plan  INFLAMED SEBORRHEIC KERATOSIS R lower leg Bx proven - symptomatic, irritating, patient would like treated.  Destruction of lesion - R lower leg Complexity: simple   Destruction method: cryotherapy   Informed consent: discussed and consent obtained   Timeout:  patient name, date of birth, surgical site, and procedure verified Lesion destroyed using liquid nitrogen: Yes   Region frozen until ice ball extended beyond lesion: Yes   Outcome: patient tolerated procedure well with no complications   Post-procedure details: wound care instructions given     Return if symptoms worsen or fail to improve.  Christine Ray, CMA, am acting as scribe for Boneta Sharps, MD .   Documentation: I have reviewed the above documentation for accuracy and completeness, and I agree with the above.  Boneta Sharps, MD

## 2024-03-20 NOTE — Patient Instructions (Signed)

## 2024-03-27 ENCOUNTER — Encounter (INDEPENDENT_AMBULATORY_CARE_PROVIDER_SITE_OTHER): Payer: Self-pay | Admitting: Nurse Practitioner

## 2024-03-27 ENCOUNTER — Ambulatory Visit (INDEPENDENT_AMBULATORY_CARE_PROVIDER_SITE_OTHER): Admitting: Nurse Practitioner

## 2024-03-27 ENCOUNTER — Ambulatory Visit: Admitting: Internal Medicine

## 2024-03-27 VITALS — BP 125/83 | HR 65 | Resp 18 | Ht 70.0 in | Wt 211.2 lb

## 2024-03-27 DIAGNOSIS — I83812 Varicose veins of left lower extremities with pain: Secondary | ICD-10-CM | POA: Diagnosis not present

## 2024-03-27 NOTE — Progress Notes (Signed)
Varicose veins of left lower extremity with inflammation (454.1  I83.10) Current Plans   Indication: Patient presents with symptomatic varicose veins of the left lower extremity.   Procedure: Sclerotherapy using hypertonic saline mixed with 1% Lidocaine was performed on the left lower extremity. Compression wraps were placed. The patient tolerated the procedure well. 

## 2024-03-31 ENCOUNTER — Ambulatory Visit: Admitting: Internal Medicine

## 2024-04-01 ENCOUNTER — Telehealth: Payer: Self-pay

## 2024-04-01 ENCOUNTER — Telehealth (INDEPENDENT_AMBULATORY_CARE_PROVIDER_SITE_OTHER): Payer: Self-pay

## 2024-04-01 NOTE — Telephone Encounter (Signed)
 I would prefer she try to hold off using that while she's getting sclerotherapy done

## 2024-04-01 NOTE — Telephone Encounter (Signed)
 Patient called because she found a cream that she has used on her leg for the rash in the past. It is Triamcinolone  Cream 0.05%. She would like to know if this is okay to continue to use?

## 2024-04-01 NOTE — Telephone Encounter (Signed)
 Patient called stating she was seen in the office last week and she had a medication she was using before that she wanted to ask about, but she couldn't find it the day of her appointment to bring it in. She has located the medication and wanted to see if it was something we could refill for her? She stated she was using it on her legs before.  Returned patients phone call and she stated the cream was Triaminolone Acetonide 0.5% and she has been using it on her R leg and it seems to have helped the inflamed areas. Is this something we can call into the pahrmacy? Please advise.

## 2024-04-02 NOTE — Telephone Encounter (Signed)
 Patient called back yesterday afternoon and left voicemail stating she called our office by accident, but meant to call dermatology in reference to cream she was requesting for her leg. Patient stated she had a rash on  her leg that she was using the cream for and she just got the doctors mixed up. Patient stated her leg from sclerotherapy last week is fine and she was sorry for the mix up.

## 2024-04-02 NOTE — Telephone Encounter (Signed)
 Patient advised of information per Dr. Felipe Horton. aw

## 2024-04-09 ENCOUNTER — Ambulatory Visit

## 2024-04-09 ENCOUNTER — Other Ambulatory Visit: Payer: Self-pay

## 2024-04-09 DIAGNOSIS — R3121 Asymptomatic microscopic hematuria: Secondary | ICD-10-CM

## 2024-04-10 ENCOUNTER — Ambulatory Visit: Payer: Self-pay | Admitting: Internal Medicine

## 2024-04-10 LAB — URINALYSIS, ROUTINE W REFLEX MICROSCOPIC
Bilirubin, UA: NEGATIVE
Glucose, UA: NEGATIVE
Ketones, UA: NEGATIVE
Nitrite, UA: NEGATIVE
Protein,UA: NEGATIVE
RBC, UA: NEGATIVE
Specific Gravity, UA: 1.015 (ref 1.005–1.030)
Urobilinogen, Ur: 0.2 mg/dL (ref 0.2–1.0)
pH, UA: 7.5 (ref 5.0–7.5)

## 2024-04-10 LAB — MICROSCOPIC EXAMINATION
Casts: NONE SEEN /LPF
RBC, Urine: NONE SEEN /HPF (ref 0–2)

## 2024-04-28 ENCOUNTER — Encounter (INDEPENDENT_AMBULATORY_CARE_PROVIDER_SITE_OTHER): Payer: Self-pay | Admitting: Nurse Practitioner

## 2024-04-28 ENCOUNTER — Ambulatory Visit (INDEPENDENT_AMBULATORY_CARE_PROVIDER_SITE_OTHER): Admitting: Nurse Practitioner

## 2024-04-28 VITALS — Ht 70.0 in | Wt 212.8 lb

## 2024-04-28 DIAGNOSIS — I83812 Varicose veins of left lower extremities with pain: Secondary | ICD-10-CM

## 2024-04-28 NOTE — Progress Notes (Signed)
Varicose veins of left lower extremity with inflammation (454.1  I83.10) Current Plans   Indication: Patient presents with symptomatic varicose veins of the left lower extremity.   Procedure: Sclerotherapy using hypertonic saline mixed with 1% Lidocaine was performed on the left lower extremity. Compression wraps were placed. The patient tolerated the procedure well. 

## 2024-05-06 DIAGNOSIS — R300 Dysuria: Secondary | ICD-10-CM | POA: Diagnosis not present

## 2024-05-06 DIAGNOSIS — B9689 Other specified bacterial agents as the cause of diseases classified elsewhere: Secondary | ICD-10-CM | POA: Diagnosis not present

## 2024-05-06 DIAGNOSIS — N760 Acute vaginitis: Secondary | ICD-10-CM | POA: Diagnosis not present

## 2024-05-12 ENCOUNTER — Encounter: Payer: Self-pay | Admitting: Dermatology

## 2024-05-12 ENCOUNTER — Ambulatory Visit: Admitting: Dermatology

## 2024-05-12 ENCOUNTER — Other Ambulatory Visit: Payer: Self-pay | Admitting: Internal Medicine

## 2024-05-12 DIAGNOSIS — R310 Gross hematuria: Secondary | ICD-10-CM | POA: Diagnosis not present

## 2024-05-12 DIAGNOSIS — M5432 Sciatica, left side: Secondary | ICD-10-CM

## 2024-05-12 DIAGNOSIS — T148XXA Other injury of unspecified body region, initial encounter: Secondary | ICD-10-CM

## 2024-05-12 DIAGNOSIS — Z5189 Encounter for other specified aftercare: Secondary | ICD-10-CM

## 2024-05-12 DIAGNOSIS — Z48817 Encounter for surgical aftercare following surgery on the skin and subcutaneous tissue: Secondary | ICD-10-CM

## 2024-05-12 NOTE — Patient Instructions (Signed)
 Stop Triamcinolone  ointment.  Apply Aquaphor daily after washing with soap and water, cover with non-stick telfa.  Advised wounds below knee take longer to heal.     Recommend daily broad spectrum sunscreen SPF 30+ to sun-exposed areas, reapply every 2 hours as needed. Call for new or changing lesions.  Staying in the shade or wearing long sleeves, sun glasses (UVA+UVB protection) and wide brim hats (4-inch brim around the entire circumference of the hat) are also recommended for sun protection.     Due to recent changes in healthcare laws, you may see results of your pathology and/or laboratory studies on MyChart before the doctors have had a chance to review them. We understand that in some cases there may be results that are confusing or concerning to you. Please understand that not all results are received at the same time and often the doctors may need to interpret multiple results in order to provide you with the best plan of care or course of treatment. Therefore, we ask that you please give us  2 business days to thoroughly review all your results before contacting the office for clarification. Should we see a critical lab result, you will be contacted sooner.   If You Need Anything After Your Visit  If you have any questions or concerns for your doctor, please call our main line at [phone] and press option 4 to reach your doctor's medical assistant. If no one answers, please leave a voicemail as directed and we will return your call as soon as possible. Messages left after 4 pm will be answered the following business day.   You may also send us  a message via MyChart. We typically respond to MyChart messages within 1-2 business days.  For prescription refills, please ask your pharmacy to contact our office. Our fax number is [phone].  If you have an urgent issue when the clinic is closed that cannot wait until the next business day, you can page your doctor at the number  below.    Please note that while we do our best to be available for urgent issues outside of office hours, we are not available 24/7.   If you have an urgent issue and are unable to reach us , you may choose to seek medical care at your doctor's office, retail clinic, urgent care center, or emergency room.  If you have a medical emergency, please immediately call 911 or go to the emergency department.  Pager Numbers  - Dr. Hester: [phone]  - Dr. Jackquline: [phone]  - Dr. Claudene: [phone]   - Dr. Raymund: [phone]  In the event of inclement weather, please call our main line at [phone] for an update on the status of any delays or closures.  Dermatology Medication Tips: Please keep the boxes that topical medications come in in order to help keep track of the instructions about where and how to use these. Pharmacies typically print the medication instructions only on the boxes and not directly on the medication tubes.   If your medication is too expensive, please contact our office at [phone] option 4 or send us  a message through MyChart.   We are unable to tell what your co-pay for medications will be in advance as this is different depending on your insurance coverage. However, we may be able to find a substitute medication at lower cost or fill out paperwork to get insurance to cover a needed medication.   If a prior authorization is required to get your medication covered  by your insurance company, please allow us  1-2 business days to complete this process.  Drug prices often vary depending on where the prescription is filled and some pharmacies may offer cheaper prices.  The website www.goodrx.com contains coupons for medications through different pharmacies. The prices here do not account for what the cost may be with help from insurance (it may be cheaper with your insurance), but the website can give you the price if you did not use any insurance.  - You  can print the associated coupon and take it with your prescription to the pharmacy.  - You may also stop by our office during regular business hours and pick up a GoodRx coupon card.  - If you need your prescription sent electronically to a different pharmacy, notify our office through [facility] or by phone at [phone] option 4.     Si Usted Necesita Algo Despus de Su Visita  Tambin puede enviarnos un mensaje a travs de Clinical cytogeneticist. Por lo general respondemos a los mensajes de MyChart en el transcurso de 1 a 2 das hbiles.  Para renovar recetas, por favor pida a su farmacia que se ponga en contacto con nuestra oficina. Randi lakes de fax es [place] [phone].  Si tiene un asunto urgente cuando la clnica est cerrada y que no puede esperar hasta el siguiente da hbil, puede llamar/localizar a su doctor(a) al nmero que aparece a continuacin.   Por favor, tenga en cuenta que aunque hacemos todo lo posible para estar disponibles para asuntos urgentes fuera del horario de [place], no estamos disponibles las 24 horas del da, los 7 [address]  [address] de la [place].   Si tiene un problema urgente y no puede comunicarse con nosotros, puede optar por buscar atencin mdica  en el consultorio de su doctor(a), en una clnica privada, en un centro de atencin urgente o en una sala de emergencias.  Si tiene Engineer, drilling, por favor llame inmediatamente al 911 o vaya a la sala de emergencias.  Nmeros de bper  - Dr. Hester: [phone]  - Dra. Jackquline: [phone]  - Dr. Claudene: [phone]  - Dra. Kitts: [phone]  En caso de inclemencias del [place], por favor llame a nuestra lnea principal al [phone] para una actualizacin sobre el estado de cualquier retraso o cierre.  Consejos para la medicacin en dermatologa: Por favor, guarde las cajas en las que vienen los medicamentos de uso tpico para ayudarle a seguir las instrucciones sobre dnde y cmo usarlos. Las  farmacias generalmente imprimen las instrucciones del medicamento slo en las cajas y no directamente en los tubos del [place].   Si su medicamento es muy caro, por favor, pngase en contacto con landry rieger llamando al [phone] y presione la opcin 4 o envenos un mensaje a travs de Clinical cytogeneticist.   No podemos decirle cul ser su copago por los medicamentos por adelantado ya que esto es diferente dependiendo de la cobertura de su seguro. Sin embargo, es posible que podamos encontrar un medicamento sustituto a Audiological scientist un formulario para que el seguro cubra el medicamento que se considera necesario.   Si se requiere una autorizacin previa para que su compaa de seguros malta su medicamento, por favor permtanos de 1 a 2 das hbiles para completar este proceso.  Los precios de los medicamentos varan con frecuencia dependiendo del Environmental consultant de dnde se surte la receta y alguna farmacias pueden ofrecer precios ms baratos.  El sitio web www.goodrx.com tiene cupones para medicamentos de [employer]  farmacias. Los precios aqu no tienen en cuenta lo que podra costar con la ayuda del seguro (puede ser ms barato con su seguro), pero el sitio web puede darle el precio si no utiliz Tourist information centre manager.  - Puede imprimir el cupn correspondiente y llevarlo con su receta a la farmacia.  - Tambin puede pasar por nuestra oficina durante el horario de atencin regular y Education officer, museum una tarjeta de cupones de GoodRx.  - Si necesita que su receta se enve electrnicamente a una farmacia diferente, informe a nuestra oficina a travs de MyChart de [place] o por telfono llamando al [phone] y presione la opcin 4.

## 2024-05-12 NOTE — Progress Notes (Signed)
   Follow-Up Visit   Subjective  Christine Ray is a 68 y.o. female who presents for the following: 6 week follow up. Hx of ISK, biopsy proven, Tx with LN2 03/20/2024. Blister formed after treatment. Patient concerned that area may not be healing well. States area has been puffy and tender at times. Has been using Triamcinolone  0.05% cream. She states she has used Locoid Lipocream in the past and it works better.    The following portions of the chart were reviewed this encounter and updated as appropriate: medications, allergies, medical history  Review of Systems:  No other skin or systemic complaints except as noted in HPI or Assessment and Plan.  Objective  Well appearing patient in no apparent distress; mood and affect are within normal limits.  A focused examination was performed of the following areas: Right leg  Relevant exam findings are noted in the Assessment and Plan.      Assessment & Plan   Wound check, secondary to LN2 treatment  Exam: healing ulceration with peripheral hyperpigmentation on R lower leg  Treatment: Start Aquaphor daily after washing, cover with non-stick telfa. Advised wounds below knee take longer to heal.   WOUND OF SKIN   VISIT FOR WOUND CHECK    Return for Wound check in 2-3 months.  I, Jill Parcell, CMA, am acting as scribe for Boneta Sharps, MD.   Documentation: I have reviewed the above documentation for accuracy and completeness, and I agree with the above.  Boneta Sharps, MD

## 2024-05-13 NOTE — Telephone Encounter (Signed)
 Requested medication (s) are due for refill today: na   Requested medication (s) are on the active medication list: yes   Last refill:  02/28/24 #90 0 refills  Future visit scheduled: yes in 3 months   Notes to clinic:  not delegated per protocol, do you want to refill Rx?     Requested Prescriptions  Pending Prescriptions Disp Refills   cyclobenzaprine  (FLEXERIL ) 10 MG tablet [Pharmacy Med Name: CYCLOBENZAPRINE  HYDROCHLORIDE 10 MG Oral Tablet] 90 tablet 3    Sig: TAKE 1 TABLET AT BEDTIME     Not Delegated - Analgesics:  Muscle Relaxants Failed - 05/13/2024  1:53 PM      Failed - This refill cannot be delegated      Passed - Valid encounter within last 6 months    Recent Outpatient Visits           2 months ago Annual physical exam    [facility] at [facility], Leita DEL, MD   4 months ago Essential hypertension    Primary Care & Sports Medicine at [facility], Leita DEL, MD   5 months ago Essential hypertension   [facility] at [facility], Leita DEL, MD       Future Appointments             In 1 month Claudene Lehmann, MD [facility]   In 3 months Justus, Leita DEL, MD [facility] at [facility], [facility]

## 2024-05-19 ENCOUNTER — Encounter: Payer: Self-pay | Admitting: Dermatology

## 2024-05-20 DIAGNOSIS — R310 Gross hematuria: Secondary | ICD-10-CM | POA: Diagnosis not present

## 2024-05-22 ENCOUNTER — Ambulatory Visit: Admitting: Emergency Medicine

## 2024-05-22 ENCOUNTER — Other Ambulatory Visit: Payer: Self-pay | Admitting: Internal Medicine

## 2024-05-22 VITALS — Ht 71.0 in | Wt 209.0 lb

## 2024-05-22 DIAGNOSIS — I10 Essential (primary) hypertension: Secondary | ICD-10-CM

## 2024-05-22 DIAGNOSIS — Z0000 Encounter for general adult medical examination without abnormal findings: Secondary | ICD-10-CM | POA: Diagnosis not present

## 2024-05-22 NOTE — Telephone Encounter (Signed)
 Requested Prescriptions  Refused Prescriptions Disp Refills   amLODipine  (NORVASC ) 2.5 MG tablet [Pharmacy Med Name: AMLODIPINE  BESYLATE 2.5 MG Oral Tablet] 90 tablet 3    Sig: TAKE 1 TABLET EVERY DAY     Cardiovascular: Calcium Channel Blockers 2 Passed - 05/22/2024  2:38 PM      Passed - Last BP in normal range    BP Readings from Last 1 Encounters:  03/27/24 125/83         Passed - Last Heart Rate in normal range    Pulse Readings from Last 1 Encounters:  03/27/24 65         Passed - Valid encounter within last 6 months    Recent Outpatient Visits           2 months ago Annual physical exam   [place] [facility] at [facility], Leita DEL, MD   4 months ago Essential hypertension   [place] [facility] at [facility], Leita DEL, MD   5 months ago Essential hypertension   [facility] Medicine at [facility], Leita DEL, MD       Future Appointments             In 1 month Claudene Lehmann, MD [facility]   In 3 months Justus, Leita DEL, MD [facility] Primary Care & Sports Medicine at [facility], [phone] [place]

## 2024-05-22 NOTE — Patient Instructions (Signed)
 Christine Ray , Thank you for taking time out of your busy schedule to complete your Annual Wellness Visit with me. I enjoyed our conversation and look forward to speaking with you again next year. I, as well as your care team,  appreciate your ongoing commitment to your health goals. Please review the following plan we discussed and let me know if I can assist you in the future. Your Game plan/ To Do List    Referrals: None   Follow up Visits: We will see or speak with you next year for your Next Medicare AWV with our clinical staff Have you seen your provider in the last 6 months (3 months if uncontrolled diabetes)? Yes  Clinician Recommendations: Get the flu and covid vaccines at your convenience.  Aim for 30 minutes of exercise or brisk walking, 6-8 glasses of water, and 5 servings of fruits and vegetables each day.       This is a list of the screenings recommended for you:  Health Maintenance  Topic Date Due   Flu Shot  04/18/2024   COVID-19 Vaccine (7 - Moderna risk 2024-25 season) 05/19/2024   Mammogram  10/03/2024   Medicare Annual Wellness Visit  05/22/2025   Colon Cancer Screening  11/08/2026   DEXA scan (bone density measurement)  06/30/2027   DTaP/Tdap/Td vaccine (2 - Td or Tdap) 06/11/2028   Pneumococcal Vaccine for age over 12  Completed   Hepatitis C Screening  Completed   Zoster (Shingles) Vaccine  Completed   HPV Vaccine  Aged Out   Meningitis B Vaccine  Aged Out    Advanced directives: (ACP Link)Information on Advanced Care Planning can be found at [employer] [facility] Advance Health Care Directives Advance Health Care Directives. [email or web address]/ You may also get the forms at your doctor's office. Advance Care Planning is important because it:  [x]  Makes sure you receive the medical care that is consistent with your values, goals, and preferences  [x]  It provides guidance to your family and loved ones and reduces their decisional burden about whether or not  they are making the right decisions based on your wishes.  Follow the link provided in your after visit summary or read over the paperwork we have mailed to you to help you started getting your Advance Directives in place. If you need assistance in completing these, please reach out to us  so that we can help you!  See attachments for Preventive Care and Fall Prevention Tips.   Fall Prevention in the Home, Adult Falls can cause injuries and affect people of all ages. There are many simple things that you can do to make your home safe and to help prevent falls. If you need it, ask for help making these changes. What actions can I take to prevent falls? General information Use good lighting in all rooms. Make sure to: Replace any light bulbs that burn out. Turn on lights if it is dark and use night-lights. Keep items that you use often in easy-to-reach places. Lower the shelves around your home if needed. Move furniture so that there are clear paths around it. Do not keep throw rugs or other things on the floor that can make you trip. If any of your floors are uneven, fix them. Add color or contrast paint or tape to clearly mark and help you see: Grab bars or handrails. First and last steps of staircases. Where the edge of each step is. If you use a ladder or stepladder: Make  sure that it is fully opened. Do not climb a closed ladder. Make sure the sides of the ladder are locked in place. Have someone hold the ladder while you use it. Know where your pets are as you move through your home. What can I do in the bathroom?     Keep the floor dry. Clean up any water that is on the floor right away. Remove soap buildup in the bathtub or shower. Buildup makes bathtubs and showers slippery. Use non-skid mats or decals on the floor of the bathtub or shower. Attach bath mats securely with double-sided, non-slip rug tape. If you need to sit down while you are in the shower, use a non-slip  stool. Install grab bars by the toilet and in the bathtub and shower. Do not use towel bars as grab bars. What can I do in the bedroom? Make sure that you have a light by your bed that is easy to reach. Do not use any sheets or blankets on your bed that hang to the floor. Have a firm bench or chair with side arms that you can use for support when you get dressed. What can I do in the kitchen? Clean up any spills right away. If you need to reach something above you, use a sturdy step stool that has a grab bar. Keep electrical cables out of the way. Do not use floor polish or wax that makes floors slippery. What can I do with my stairs? Do not leave anything on the stairs. Make sure that you have a light switch at the top and the bottom of the stairs. Have them installed if you do not have them. Make sure that there are handrails on both sides of the stairs. Fix handrails that are broken or loose. Make sure that handrails are as long as the staircases. Install non-slip stair treads on all stairs in your home if they do not have carpet. Avoid having throw rugs at the top or bottom of stairs, or secure the rugs with carpet tape to prevent them from moving. Choose a carpet design that does not hide the edge of steps on the stairs. Make sure that carpet is firmly attached to the stairs. Fix any carpet that is loose or worn. What can I do on the outside of my home? Use bright outdoor lighting. Repair the edges of walkways and driveways and fix any cracks. Clear paths of anything that can make you trip, such as tools or rocks. Add color or contrast paint or tape to clearly mark and help you see high doorway thresholds. Trim any bushes or trees on the main path into your home. Check that handrails are securely fastened and in good repair. Both sides of all steps should have handrails. Install guardrails along the edges of any raised decks or porches. Have leaves, snow, and ice cleared regularly. Use  sand, salt, or ice melt on walkways during winter months if you live where there is ice and snow. In the garage, clean up any spills right away, including grease or oil spills. What other actions can I take? Review your medicines with your health care provider. Some medicines can make you confused or feel dizzy. This can increase your chance of falling. Wear closed-toe shoes that fit well and support your feet. Wear shoes that have rubber soles and low heels. Use a cane, walker, scooter, or crutches that help you move around if needed. Talk with your provider about other ways that you can  decrease your risk of falls. This may include seeing a physical therapist to learn to do exercises to improve movement and strength. Where to find more information Centers for Disease Control and Prevention, [employer]: TonerPromos.no [employer] on Aging: BaseRingTones.pl National Institute on Aging: BaseRingTones.pl Contact a health care provider if: You are afraid of falling at home. You feel weak, drowsy, or dizzy at home. You fall at home. Get help right away if you: Lose consciousness or have trouble moving after a fall. Have a fall that causes a head injury. These symptoms may be an emergency. Get help right away. Call 911. Do not wait to see if the symptoms will go away. Do not drive yourself to the hospital. This information is not intended to replace advice given to you by your health care provider. Make sure you discuss any questions you have with your health care provider. Document Revised: 05/08/2022 Document Reviewed: 05/08/2022 Elsevier Patient Education  2024 [employer].

## 2024-05-22 NOTE — Progress Notes (Signed)
 Subjective:   Christine Ray is a 68 y.o. who presents for a Medicare Wellness preventive visit.  As a reminder, Annual Wellness Visits don't include a physical exam, and some assessments may be limited, especially if this visit is performed virtually. We may recommend an in-person follow-up visit with your provider if needed.  Visit Complete: Virtual I connected with  Christine Ray on 05/22/24 by a audio enabled telemedicine application and verified that I am speaking with the correct person using two identifiers.  Patient Location: Home  Provider Location: Home Office  I discussed the limitations of evaluation and management by telemedicine. The patient expressed understanding and agreed to proceed.  Vital Signs: Because this visit was a virtual/telehealth visit, some criteria may be missing or patient reported. Any vitals not documented were not able to be obtained and vitals that have been documented are patient reported.  VideoDeclined- This patient declined Librarian, academic. Therefore the visit was completed with audio only.  Persons Participating in Visit: Patient.  AWV Questionnaire: Yes: Patient Medicare AWV questionnaire was completed by the patient on 05/19/24; I have confirmed that all information answered by patient is correct and no changes since this date.  Cardiac Risk Factors include: advanced age (>85men, >28 women);dyslipidemia;hypertension     Objective:    Today's Vitals   05/22/24 0835  Weight: 209 lb (94.8 kg)  Height: 5' 11 (1.803 m)  PainSc: 3    Body mass index is 29.15 kg/m.     05/22/2024    8:48 AM 05/15/2023   10:39 AM 03/15/2022    9:22 AM 11/08/2021    8:30 AM 03/14/2021    9:36 AM 03/10/2020    9:33 AM 11/30/2019   10:55 PM  Advanced Directives  Does Patient Have a Medical Advance Directive? No No Yes No Yes Yes No  Type of Best boy of [place];Living will  Healthcare Power of  Reserve;Living will Healthcare Power of [place];Living will   Copy of Healthcare Power of Attorney in Chart?   No - copy requested  No - copy requested No - copy requested   Would patient like information on creating a medical advance directive? Yes (MAU/Ambulatory/Procedural Areas - Information given) No - Patient declined     No - Patient declined    Current Medications (verified) Outpatient Encounter Medications as of 05/22/2024  Medication Sig   amLODipine  (NORVASC ) 2.5 MG tablet Take 1 tablet (2.5 mg total) by mouth daily.   Ascorbic Acid (VITAMIN C) 500 MG CAPS Take 1,000 mg by mouth.   aspirin EC 81 MG tablet Take 81 mg by mouth daily as needed (learned that it is good to take everyday).   azelastine (ASTELIN) 0.1 % nasal spray SMARTSIG:1-2 Spray(s) Both Nares Twice Daily (Patient taking differently: PRN)   Cholecalciferol (VITAMIN D ) 2000 units tablet Take 2,000 Units by mouth daily.   cyclobenzaprine  (FLEXERIL ) 10 MG tablet TAKE 1 TABLET AT BEDTIME (Patient taking differently: Take 10 mg by mouth at bedtime. PRN)   ibandronate (BONIVA) 150 MG tablet Take 1 tablet by mouth every 30 (thirty) days.   omeprazole  (PRILOSEC) 20 MG capsule Take 1 capsule (20 mg total) by mouth daily.   simvastatin  (ZOCOR ) 20 MG tablet Take 1 tablet (20 mg total) by mouth at bedtime.   solifenacin (VESICARE) 5 MG tablet Take 5 mg by mouth daily.   timolol (TIMOPTIC) 0.5 % ophthalmic solution Place 1 drop into both eyes daily.  No facility-administered encounter medications on file as of 05/22/2024.    Allergies (verified) Amoxicillin , Hydrocodone, Tizanidine hcl, and Oxycodone hcl   History: Past Medical History:  Diagnosis Date   Hyperlipidemia    Hyperlipidemia    Lateral epicondylitis of right elbow 01/04/2015   Past Surgical History:  Procedure Laterality Date   COLONOSCOPY WITH PROPOFOL  N/A 11/13/2016   Procedure: COLONOSCOPY WITH PROPOFOL ;  Surgeon: Gladis RAYMOND Mariner, MD;  Location: [facility]  ENDOSCOPY;  Service: Endoscopy;  Laterality: N/A;   COLONOSCOPY WITH PROPOFOL  N/A 11/08/2021   Procedure: COLONOSCOPY WITH PROPOFOL ;  Surgeon: Unk Corinn Skiff, MD;  Location: [facility] ENDOSCOPY;  Service: Gastroenterology;  Laterality: N/A;   Family History  Problem Relation Age of Onset   Breast cancer Mother 60   Prostate cancer Father    Throat cancer Brother    Social History   Socioeconomic History   Marital status: Married    Spouse name: Tanda   Number of children: 0   Years of education: Not on file   Highest education level: 12th grade  Occupational History   Occupation: retired  Tobacco Use   Smoking status: Former    Current packs/day: 0.00    Average packs/day: 0.3 packs/day for 5.1 years (1.3 ttl pk-yrs)    Types: Cigarettes    Start date: 3    Quit date: 10/14/1975    Years since quitting: 48.6   Smokeless tobacco: Never  Vaping Use   Vaping status: Never Used  Substance and Sexual Activity   Alcohol use: No   Drug use: No   Sexual activity: Not on file  Other Topics Concern   Not on file  Social History Narrative   1 step-son   Social Drivers of Health   Financial Resource Strain: Low Risk  (05/22/2024)   Overall Financial Resource Strain (CARDIA)    Difficulty of Paying Living Expenses: Not hard at all  Food Insecurity: No Food Insecurity (05/22/2024)   Hunger Vital Sign    Worried About Running Out of Food in the Last Year: Never true    Ran Out of Food in the Last Year: Never true  Transportation Needs: No Transportation Needs (05/22/2024)   PRAPARE - Administrator, Civil Service (Medical): No    Lack of Transportation (Non-Medical): No  Physical Activity: Insufficiently Active (05/22/2024)   Exercise Vital Sign    Days of Exercise per Week: 3 days    Minutes of Exercise per Session: 30 min  Stress: No Stress Concern Present (05/22/2024)   [employer] of Occupational Health - Occupational Stress Questionnaire    Feeling of Stress:  Not at all  Social Connections: Socially Integrated (05/22/2024)   Social Connection and Isolation Panel    Frequency of Communication with Friends and Family: More than three times a week    Frequency of Social Gatherings with Friends and Family: Three times a week    Attends Religious Services: More than 4 times per year    Active Member of Clubs or Organizations: Yes    Attends Engineer, structural: More than 4 times per year    Marital Status: Married    Tobacco Counseling Counseling given: Not Answered    Clinical Intake:  Pre-visit preparation completed: Yes  Pain : 0-10 Pain Score: 3  Pain Type: Chronic pain Pain Location: Back Pain Descriptors / Indicators: Aching     BMI - recorded: 29.15 Nutritional Status: BMI 25 -29 Overweight Nutritional Risks: None Diabetes: No  Lab  Results  Component Value Date   HGBA1C 5.6 03/06/2024     How often do you need to have someone help you when you read instructions, pamphlets, or other written materials from your doctor or pharmacy?: 1 - Never  Interpreter Needed?: No  Information entered by :: Vina Ned, CMA   Activities of Daily Living     05/22/2024    8:36 AM 05/19/2024   10:43 PM  In your present state of health, do you have any difficulty performing the following activities:  Hearing? 0 0  Vision? 0 0  Difficulty concentrating or making decisions? 0 0  Walking or climbing stairs? 0 0  Dressing or bathing? 0 0  Doing errands, shopping? 0 0  Preparing Food and eating ? N N  Using the Toilet? N N  In the past six months, have you accidently leaked urine? Y Y  Comment due to UTI   Do you have problems with loss of bowel control? N N  Managing your Medications? N N  Managing your Finances? N N  Housekeeping or managing your Housekeeping? N N    Patient Care Team: Justus Leita DEL, MD as PCP - General (Internal Medicine) Schermerhorn, Ned PARAS, MD as Referring Physician (Obstetrics and  Gynecology) Leonce Longs, [place] (Optometry) Claudene Lehmann, MD as Referring Physician (Dermatology) Brown, Fallon E, NP as Nurse Practitioner (Vascular Surgery)  I have updated your Care Teams any recent Medical Services you may have received from other providers in the past year.     Assessment:   This is a routine wellness examination for Christine Ray.  Hearing/Vision screen Hearing Screening - Comments:: Denies hearing loss  Vision Screening - Comments:: Gets routine eye exams, Dr. Longs Leonce @ [address] [place]   Goals Addressed               This Visit's Progress     Weight (lb) < 190 lb (86.2 kg) (pt-stated)   209 lb (94.8 kg)      Depression Screen     05/22/2024    8:45 AM 03/06/2024    8:12 AM 12/26/2023   11:02 AM 11/27/2023    9:06 AM 08/27/2023    9:21 AM 08/08/2023    8:28 AM 07/27/2023    3:36 PM  PHQ 2/9 Scores  PHQ - 2 Score 0 0 1 0 0 0 0  PHQ- 9 Score 1 0 1 1 0 1 0    Fall Risk     05/22/2024    8:50 AM 05/19/2024   10:43 PM 03/06/2024    8:11 AM 12/26/2023   11:02 AM 11/27/2023    9:06 AM  Fall Risk   Falls in the past year? 0 0 0 0 0  Number falls in past yr: 0  0 0 0  Injury with Fall? 0  0 0 0  Risk for fall due to : No Fall Risks  No Fall Risks No Fall Risks No Fall Risks  Follow up Falls evaluation completed  Falls evaluation completed Falls evaluation completed Falls evaluation completed    MEDICARE RISK AT HOME:  Medicare Risk at Home Any stairs in or around the home?: Yes If so, are there any without handrails?: No Home free of loose throw rugs in walkways, pet beds, electrical cords, etc?: Yes Adequate lighting in your home to reduce risk of falls?: Yes Life alert?: No Use of a cane, walker or w/c?: No Grab bars in the bathroom?: No Shower chair or  bench in shower?: Yes Elevated toilet seat or a handicapped toilet?: Yes  TIMED UP AND GO:  Was the test performed?  No  Cognitive Function: 6CIT completed        05/22/2024     8:51 AM 05/15/2023   10:41 AM  6CIT Screen  What Year? 0 points 0 points  What month? 0 points 0 points  What time? 0 points 0 points  Count back from 20 0 points 0 points  Months in reverse 0 points 0 points  Repeat phrase 2 points 0 points  Total Score 2 points 0 points    Immunizations Immunization History  Administered Date(s) Administered   Fluad Quad(high Dose 65+) 06/13/2019, 06/13/2021, 08/15/2022   Fluad Trivalent(High Dose 65+) 08/08/2023   Influenza Inj Mdck Quad With Preservative 06/20/2018   Influenza,inj,Quad PF,6+ Mos 10/27/2016, 06/22/2020   Influenza-Unspecified 10/27/2016, 06/20/2018   Moderna Sars-Covid-2 Vaccination 10/05/2019, 11/02/2019, 07/20/2020, 07/14/2021   PNEUMOCOCCAL CONJUGATE-20 02/14/2022   Pfizer(Comirnaty)Fall Seasonal Vaccine 12 years and older 08/15/2022, 08/31/2023   Pneumococcal Conjugate-13 01/29/2019   Tdap 06/11/2018   Zoster Recombinant(Shingrix) 05/15/2018, 07/19/2018    Screening Tests Health Maintenance  Topic Date Due   INFLUENZA VACCINE  04/18/2024   COVID-19 Vaccine (7 - Moderna risk 2024-25 season) 05/19/2024   MAMMOGRAM  10/03/2024   Medicare Annual Wellness (AWV)  05/22/2025   Colonoscopy  11/08/2026   DEXA SCAN  06/30/2027   DTaP/Tdap/Td (2 - Td or Tdap) 06/11/2028   Pneumococcal Vaccine: 50+ Years  Completed   Hepatitis C Screening  Completed   Zoster Vaccines- Shingrix  Completed   HPV VACCINES  Aged Out   Meningococcal B Vaccine  Aged Out    Health Maintenance  Health Maintenance Due  Topic Date Due   INFLUENZA VACCINE  04/18/2024   COVID-19 Vaccine (7 - Moderna risk 2024-25 season) 05/19/2024   Health Maintenance Items Addressed: See Nurse Notes at the end of this note  Additional Screening:  Vision Screening: Recommended annual ophthalmology exams for early detection of glaucoma and other disorders of the eye. Would you like a referral to an eye doctor? No    Dental Screening: Recommended annual  dental exams for proper oral hygiene  Community Resource Referral / Chronic Care Management: CRR required this visit?  No   CCM required this visit?  No   Plan:    I have personally reviewed and noted the following in the patient's chart:   Medical and social history Use of alcohol, tobacco or illicit drugs  Current medications and supplements including opioid prescriptions. Patient is not currently taking opioid prescriptions. Functional ability and status Nutritional status Physical activity Advanced directives List of other physicians Hospitalizations, surgeries, and ER visits in previous 12 months Vitals Screenings to include cognitive, depression, and falls Referrals and appointments  In addition, I have reviewed and discussed with patient certain preventive protocols, quality metrics, and best practice recommendations. A written personalized care plan for preventive services as well as general preventive health recommendations were provided to patient.   Vina Ned, CMA   05/22/2024   After Visit Summary: (MyChart) Due to this being a telephonic visit, the after visit summary with patients personalized plan was offered to patient via MyChart   Notes:  6 CIT Score - 2 Will get flu & covid vaccines

## 2024-06-03 ENCOUNTER — Ambulatory Visit (INDEPENDENT_AMBULATORY_CARE_PROVIDER_SITE_OTHER): Admitting: Nurse Practitioner

## 2024-06-03 ENCOUNTER — Encounter (INDEPENDENT_AMBULATORY_CARE_PROVIDER_SITE_OTHER): Payer: Self-pay | Admitting: Nurse Practitioner

## 2024-06-03 VITALS — BP 125/79 | HR 72 | Resp 18 | Ht 71.0 in | Wt 216.2 lb

## 2024-06-03 DIAGNOSIS — I83812 Varicose veins of left lower extremities with pain: Secondary | ICD-10-CM

## 2024-06-03 NOTE — Progress Notes (Signed)
Varicose veins of left lower extremity with inflammation (454.1  I83.10) Current Plans   Indication: Patient presents with symptomatic varicose veins of the left lower extremity.   Procedure: Sclerotherapy using hypertonic saline mixed with 1% Lidocaine was performed on the left lower extremity. Compression wraps were placed. The patient tolerated the procedure well. 

## 2024-07-02 ENCOUNTER — Ambulatory Visit: Admitting: Student

## 2024-07-02 ENCOUNTER — Ambulatory Visit
Admission: RE | Admit: 2024-07-02 | Discharge: 2024-07-02 | Disposition: A | Discharge status: Home / Self Care | Attending: Internal Medicine | Admitting: Internal Medicine

## 2024-07-02 ENCOUNTER — Ambulatory Visit: Payer: Self-pay

## 2024-07-02 ENCOUNTER — Encounter: Payer: Self-pay | Admitting: Internal Medicine

## 2024-07-02 ENCOUNTER — Ambulatory Visit
Admission: RE | Admit: 2024-07-02 | Discharge: 2024-07-02 | Disposition: A | Discharge status: Home / Self Care | Source: Ambulatory Visit | Attending: Internal Medicine | Admitting: Internal Medicine

## 2024-07-02 ENCOUNTER — Ambulatory Visit (INDEPENDENT_AMBULATORY_CARE_PROVIDER_SITE_OTHER): Admitting: Internal Medicine

## 2024-07-02 VITALS — BP 112/82 | HR 68 | Ht 71.0 in | Wt 215.0 lb

## 2024-07-02 DIAGNOSIS — G8929 Other chronic pain: Secondary | ICD-10-CM

## 2024-07-02 DIAGNOSIS — M25562 Pain in left knee: Secondary | ICD-10-CM | POA: Diagnosis not present

## 2024-07-02 DIAGNOSIS — I10 Essential (primary) hypertension: Secondary | ICD-10-CM | POA: Diagnosis not present

## 2024-07-02 DIAGNOSIS — Z23 Encounter for immunization: Secondary | ICD-10-CM | POA: Diagnosis not present

## 2024-07-02 DIAGNOSIS — M47817 Spondylosis without myelopathy or radiculopathy, lumbosacral region: Secondary | ICD-10-CM

## 2024-07-02 DIAGNOSIS — M1712 Unilateral primary osteoarthritis, left knee: Secondary | ICD-10-CM | POA: Diagnosis not present

## 2024-07-02 MED ORDER — AMLODIPINE BESYLATE 2.5 MG PO TABS: 2.5000 mg | ORAL_TABLET | Freq: Every day | ORAL | 1 refills | Status: AC

## 2024-07-02 MED ORDER — METHOCARBAMOL 500 MG PO TABS: 500.0000 mg | ORAL_TABLET | Freq: Three times a day (TID) | ORAL | 0 refills | Status: DC | PRN

## 2024-07-02 NOTE — Progress Notes (Signed)
 Date:  07/02/2024   Name:  Christine Ray   DOB:  June 02, 1956   MRN:  [id number]   Chief Complaint: Back Pain (Lower back and bilateral leg pain. Left is worse than right. Patient is saying she is aching. )  Back Pain This is a recurrent problem. The current episode started 1 to 4 weeks ago. The problem occurs constantly. The problem has been gradually improving since onset. The pain is present in the lumbar spine. The quality of the pain is described as aching. The pain does not radiate. The pain is mild. The pain is Worse during the night. Stiffness is present At night. Pertinent negatives include no bladder incontinence, bowel incontinence, chest pain, leg pain, paresthesias or weakness. She has tried muscle relaxant and NSAIDs for the symptoms. The treatment provided mild relief.  Knee Pain  There was no injury mechanism. The pain is present in the left knee. The quality of the pain is described as aching and cramping. The pain is moderate. The pain has been Fluctuating since onset. The symptoms are aggravated by weight bearing and movement.    Review of Systems  Constitutional:  Negative for chills and fatigue.  Respiratory:  Negative for chest tightness and shortness of breath.   Cardiovascular:  Negative for chest pain.  Gastrointestinal:  Negative for bowel incontinence.  Genitourinary:  Negative for bladder incontinence.  Musculoskeletal:  Positive for arthralgias (left medial knee) and back pain.  Neurological:  Negative for weakness and paresthesias.  Psychiatric/Behavioral:  Negative for sleep disturbance. The patient is not nervous/anxious.      Lab Results  Component Value Date   NA 140 03/06/2024   K 4.7 03/06/2024   CO2 26 03/06/2024   GLUCOSE 70 03/06/2024   BUN 15 03/06/2024   CREATININE 0.85 03/06/2024   CALCIUM 9.4 03/06/2024   EGFR 74 03/06/2024   GFRNONAA >60 08/08/2023   Lab Results  Component Value Date   CHOL 192 03/06/2024   HDL 57 03/06/2024    LDLCALC 118 (H) 03/06/2024   TRIG 95 03/06/2024   CHOLHDL 3.4 03/06/2024   Lab Results  Component Value Date   TSH 1.300 03/06/2024   Lab Results  Component Value Date   HGBA1C 5.6 03/06/2024   Lab Results  Component Value Date   WBC 5.5 11/27/2023   HGB 13.6 11/27/2023   HCT 42.3 11/27/2023   MCV 87 11/27/2023   PLT 187 11/27/2023   Lab Results  Component Value Date   ALT 14 03/06/2024   AST 17 03/06/2024   ALKPHOS 80 03/06/2024   BILITOT 0.5 03/06/2024   Lab Results  Component Value Date   VD25OH 34.4 03/06/2024     Patient Active Problem List   Diagnosis Date Noted   Essential hypertension 08/08/2023   Spondylosis without myelopathy or radiculopathy, lumbosacral region 07/27/2023   Arthralgia of right knee 07/27/2023   History of colonic polyps    Polyp of sigmoid colon    Tubular adenoma of colon 10/11/2021   Chronic pain of left knee 04/06/2017   Mixed hyperlipidemia 04/26/2016   Vitamin D  deficiency 04/26/2016   BPPV (benign paroxysmal positional vertigo) 04/26/2016   Varicose veins of both lower extremities 04/26/2016   GERD (gastroesophageal reflux disease) 01/04/2015   Age-related osteoporosis without fracture 01/04/2015    Allergies  Allergen Reactions   Amoxicillin  Hives   Hydrocodone Other (See Comments)    headache   Tizanidine Hcl Other (See Comments)    Itching, headache  Oxycodone Hcl Rash    Past Surgical History:  Procedure Laterality Date   COLONOSCOPY WITH PROPOFOL  N/A 11/13/2016   Procedure: COLONOSCOPY WITH PROPOFOL ;  Surgeon: Gladis RAYMOND Mariner, MD;  Location: [facility] ENDOSCOPY;  Service: Endoscopy;  Laterality: N/A;   COLONOSCOPY WITH PROPOFOL  N/A 11/08/2021   Procedure: COLONOSCOPY WITH PROPOFOL ;  Surgeon: Unk Corinn Skiff, MD;  Location: [facility] ENDOSCOPY;  Service: Gastroenterology;  Laterality: N/A;    Social History   Tobacco Use   Smoking status: Former    Current packs/day: 0.00    Average packs/day: 0.3 packs/day for  5.1 years (1.3 ttl pk-yrs)    Types: Cigarettes    Start date: 10    Quit date: 10/14/1975    Years since quitting: 48.7   Smokeless tobacco: Never  Vaping Use   Vaping status: Never Used  Substance Use Topics   Alcohol use: No   Drug use: No     Medication list has been reviewed and updated.  Current Meds  Medication Sig   methocarbamol (ROBAXIN) 500 MG tablet Take 1 tablet (500 mg total) by mouth every 8 (eight) hours as needed for muscle spasms.       07/02/2024   10:09 AM 03/06/2024    8:12 AM 12/26/2023   11:02 AM 11/27/2023    9:06 AM  GAD 7 : Generalized Anxiety Score  Nervous, Anxious, on Edge 0 0 0 0  Control/stop worrying 0 0 0 0  Worry too much - different things 0 0 0 0  Trouble relaxing 0 0 0 1  Restless 0 0 0 0  Easily annoyed or irritable 0 0 0 0  Afraid - awful might happen 0 0 0 0  Total GAD 7 Score 0 0 0 1  Anxiety Difficulty Not difficult at all Not difficult at all Not difficult at all Somewhat difficult       07/02/2024   10:09 AM 05/22/2024    8:45 AM 03/06/2024    8:12 AM  Depression screen PHQ 2/9  Decreased Interest 0 0 0  Down, Depressed, Hopeless 0 0 0  PHQ - 2 Score 0 0 0  Altered sleeping 0 0 0  Tired, decreased energy 0 1 0  Change in appetite 0 0 0  Feeling bad or failure about yourself  0 0 0  Trouble concentrating 0 0 0  Moving slowly or fidgety/restless 0 0 0  Suicidal thoughts 0 0 0  PHQ-9 Score 0 1 0  Difficult doing work/chores Not difficult at all Not difficult at all Not difficult at all    BP Readings from Last 3 Encounters:  07/02/24 112/82  06/03/24 125/79  03/27/24 125/83    Physical Exam Vitals and nursing note reviewed.  Constitutional:      General: She is not in acute distress.    Appearance: She is well-developed.  HENT:     Head: Normocephalic and atraumatic.  Cardiovascular:     Rate and Rhythm: Normal rate and regular rhythm.  Pulmonary:     Effort: Pulmonary effort is normal. No respiratory  distress.     Breath sounds: No wheezing or rhonchi.  Musculoskeletal:     Lumbar back: Tenderness (lower lumbar spine and SI region) present. Negative right straight leg raise test and negative left straight leg raise test.     Left knee: Swelling and bony tenderness present. No effusion.  Skin:    General: Skin is warm and dry.     Findings: No rash.  Neurological:  Mental Status: She is alert and oriented to person, place, and time.  Psychiatric:        Mood and Affect: Mood normal.        Behavior: Behavior normal.     Wt Readings from Last 3 Encounters:  07/02/24 215 lb (97.5 kg)  06/03/24 216 lb 3.2 oz (98.1 kg)  05/22/24 209 lb (94.8 kg)    BP 112/82   Pulse 68   Ht 5' 11 (1.803 m)   Wt 215 lb (97.5 kg)   SpO2 97%   BMI 29.99 kg/m   Assessment and Plan:  Problem List Items Addressed This Visit       Unprioritized   Chronic pain of left knee   Will get imaging; may improve with Aleve  bid If no improvement, refer to Dr. Alvia, [facility]      Relevant Medications   methocarbamol (ROBAXIN) 500 MG tablet   Other Relevant Orders   DG Knee Complete 4 Views Left   Spondylosis without myelopathy or radiculopathy, lumbosacral region - Primary   Was evaluated last year by Dr. Alvia - improved with flexeril  and naproxen . She threw away the flexeril  when she got a letter from insurance stating it was not covered She has taken Aleve  intermittently with some benefit. Recommend robaxin tid,  aleve  2 tabs bid, heat and return to see Dr. Alvia if no improvement in one week      Relevant Medications   methocarbamol (ROBAXIN) 500 MG tablet   Essential hypertension (Chronic)   Relevant Medications   amLODipine  (NORVASC ) 2.5 MG tablet   Other Visit Diagnoses       Encounter for immunization       Relevant Orders   Flu vaccine HIGH DOSE PF(Fluzone Trivalent) (Completed)       No follow-ups on file.    Leita HILARIO Adie, MD [facility]  [facility] [place]

## 2024-07-02 NOTE — Assessment & Plan Note (Signed)
 Will get imaging; may improve with Aleve  bid If no improvement, refer to Dr. Alvia, [facility]

## 2024-07-02 NOTE — Telephone Encounter (Signed)
 Noted  Pt has appt.  KP

## 2024-07-02 NOTE — Telephone Encounter (Signed)
 FYI Only or Action Required?: FYI only for provider.  Patient was last seen in primary care on 03/06/2024 by Justus Leita DEL, MD.  Called Nurse Triage reporting Leg Pain.  Symptoms began over a week ago.  Interventions attempted: OTC medications: ibuprofen and Rest, hydration, or home remedies.  Symptoms are: gradually worsening.  Triage Disposition: See PCP When Office is Open (Within 3 Days)  Patient/caregiver understands and will follow disposition?: Yes          Copied from CRM [id number]. Topic: Clinical - Red Word Triage >> Jul 02, 2024  8:04 AM Rosaria BRAVO wrote: Red Word that prompted transfer to Nurse Triage: Severe pain, swelling in left leg. Worst at night. Wants to see Dr. Alvia.    ----------------------------------------------------------------------- From previous Reason for Contact - Scheduling: Patient/patient representative is calling to schedule an appointment. Refer to attachments for appointment information. Reason for Disposition  [1] MODERATE pain (e.g., interferes with normal activities, limping) AND [2] present > 3 days  Answer Assessment - Initial Assessment Questions 1. ONSET: When did the pain start?      Over a week ago 2. LOCATION: Where is the pain located?      Left knee 3. PAIN: How bad is the pain?    (Scale 1-10; or mild, moderate, severe)     8 4. WORK OR EXERCISE: Has there been any recent work or exercise that involved this part of the body?      Daily use 5. CAUSE: What do you think is causing the leg pain?     unsure 6. OTHER SYMPTOMS: Do you have any other symptoms? (e.g., chest pain, back pain, breathing difficulty, swelling, rash, fever, numbness, weakness)     no  Answer Assessment - Initial Assessment Questions Patient denies any known injuries & has had something similar to this in the past and has been told arthritis in the past Patient is advised to call us  back if anything changes or with any further  questions/concerns. Patient is advised that if anything worsens to go to the Emergency Room. Patient verbalized understanding.   1. ONSET: When did the pain start?      Over a week ago 2. LOCATION: Where is the pain located?      Left knee---medial aspect per patient 3. PAIN: How bad is the pain?    (Scale 1-10; or mild, moderate, severe)     8 4. WORK OR EXERCISE: Has there been any recent work or exercise that involved this part of the body?      Daily use 5. CAUSE: What do you think is causing the leg pain?     unsure 6. OTHER SYMPTOMS: Do you have any other symptoms? (e.g., chest pain, back pain, breathing difficulty, swelling, rash, fever, numbness, weakness)     no  Protocols used: Leg Pain-A-AH, Knee Pain-A-AH

## 2024-07-02 NOTE — Patient Instructions (Addendum)
 Aleve  2 tabs twice a day.  Take Methocarbamol 3 times per day.

## 2024-07-02 NOTE — Assessment & Plan Note (Signed)
 Was evaluated last year by Dr. Alvia - improved with flexeril  and naproxen . She threw away the flexeril  when she got a letter from insurance stating it was not covered She has taken Aleve  intermittently with some benefit. Recommend robaxin tid,  aleve  2 tabs bid, heat and return to see Dr. Alvia if no improvement in one week

## 2024-07-06 ENCOUNTER — Ambulatory Visit: Payer: Self-pay | Admitting: Internal Medicine

## 2024-07-07 ENCOUNTER — Ambulatory Visit: Admitting: Dermatology

## 2024-07-15 ENCOUNTER — Other Ambulatory Visit: Payer: Self-pay | Admitting: Internal Medicine

## 2024-07-15 ENCOUNTER — Telehealth: Payer: Self-pay

## 2024-07-15 ENCOUNTER — Ambulatory Visit (INDEPENDENT_AMBULATORY_CARE_PROVIDER_SITE_OTHER): Admitting: Nurse Practitioner

## 2024-07-15 ENCOUNTER — Encounter (INDEPENDENT_AMBULATORY_CARE_PROVIDER_SITE_OTHER): Payer: Self-pay | Admitting: Nurse Practitioner

## 2024-07-15 VITALS — BP 131/83 | HR 60 | Resp 18 | Wt 214.8 lb

## 2024-07-15 DIAGNOSIS — M47817 Spondylosis without myelopathy or radiculopathy, lumbosacral region: Secondary | ICD-10-CM

## 2024-07-15 DIAGNOSIS — I10 Essential (primary) hypertension: Secondary | ICD-10-CM | POA: Diagnosis not present

## 2024-07-15 DIAGNOSIS — I83812 Varicose veins of left lower extremities with pain: Secondary | ICD-10-CM

## 2024-07-15 MED ORDER — CYCLOBENZAPRINE HCL 5 MG PO TABS: 5.0000 mg | ORAL_TABLET | Freq: Three times a day (TID) | ORAL | 0 refills | Status: DC | PRN

## 2024-07-15 NOTE — Progress Notes (Unsigned)
 Date:  07/15/2024   Name:  JILL STOPKA   DOB:  [birth year]   MRN:  [id number]   Chief Complaint: No chief complaint on file.  HPI  Review of Systems   Lab Results  Component Value Date   NA 140 03/06/2024   K 4.7 03/06/2024   CO2 26 03/06/2024   GLUCOSE 70 03/06/2024   BUN 15 03/06/2024   CREATININE 0.85 03/06/2024   CALCIUM 9.4 03/06/2024   EGFR 74 03/06/2024   GFRNONAA >60 08/08/2023   Lab Results  Component Value Date   CHOL 192 03/06/2024   HDL 57 03/06/2024   LDLCALC 118 (H) 03/06/2024   TRIG 95 03/06/2024   CHOLHDL 3.4 03/06/2024   Lab Results  Component Value Date   TSH 1.300 03/06/2024   Lab Results  Component Value Date   HGBA1C 5.6 03/06/2024   Lab Results  Component Value Date   WBC 5.5 11/27/2023   HGB 13.6 11/27/2023   HCT 42.3 11/27/2023   MCV 87 11/27/2023   PLT 187 11/27/2023   Lab Results  Component Value Date   ALT 14 03/06/2024   AST 17 03/06/2024   ALKPHOS 80 03/06/2024   BILITOT 0.5 03/06/2024   Lab Results  Component Value Date   VD25OH 34.4 03/06/2024     Patient Active Problem List   Diagnosis Date Noted   Essential hypertension 08/08/2023   Spondylosis without myelopathy or radiculopathy, lumbosacral region 07/27/2023   Arthralgia of right knee 07/27/2023   History of colonic polyps    Polyp of sigmoid colon    Tubular adenoma of colon 10/11/2021   Chronic pain of left knee 04/06/2017   Mixed hyperlipidemia 04/26/2016   Vitamin D  deficiency 04/26/2016   BPPV (benign paroxysmal positional vertigo) 04/26/2016   Varicose veins of both lower extremities 04/26/2016   GERD (gastroesophageal reflux disease) 01/04/2015   Age-related osteoporosis without fracture 01/04/2015    Allergies  Allergen Reactions   Amoxicillin  Hives   Hydrocodone Other (See Comments)    headache   Methocarbamol Hives   Tizanidine Hcl Other (See Comments)    Itching, headache   Oxycodone Hcl Rash    Past Surgical History:   Procedure Laterality Date   COLONOSCOPY WITH PROPOFOL  N/A 11/13/2016   Procedure: COLONOSCOPY WITH PROPOFOL ;  Surgeon: Gladis RAYMOND Mariner, MD;  Location[facility] ENDOSCOPY;  Service: Endoscopy;  Laterality: N/A;   COLONOSCOPY WITH PROPOFOL  N/A 11/08/2021   Procedure: COLONOSCOPY WITH PROPOFOL ;  Surgeon: Unk Corinn Skiff, MD;  Location: [facility] ENDOSCOPY;  Service: Gastroenterology;  Laterality: N/A;    Social History   Tobacco Use   Smoking status: Former    Current packs/day: 0.00    Average packs/day: 0.3 packs/day for 5.1 years (1.3 ttl pk-yrs)    Types: Cigarettes    Start date: 47    Quit date: 10/14/1975    Years since quitting: 48.7   Smokeless tobacco: Never  Vaping Use   Vaping status: Never Used  Substance Use Topics   Alcohol use: No   Drug use: No     Medication list has been reviewed and updated.  No outpatient medications have been marked as taking for the 07/15/24 encounter (Orders Only) with Justus Leita DEL, MD.       07/02/2024   10:09 AM 03/06/2024    8:12 AM 12/26/2023   11:02 AM 11/27/2023    9:06 AM  GAD 7 : Generalized Anxiety Score  Nervous, Anxious, on Edge 0 0 0  0  Control/stop worrying 0 0 0 0  Worry too much - different things 0 0 0 0  Trouble relaxing 0 0 0 1  Restless 0 0 0 0  Easily annoyed or irritable 0 0 0 0  Afraid - awful might happen 0 0 0 0  Total GAD 7 Score 0 0 0 1  Anxiety Difficulty Not difficult at all Not difficult at all Not difficult at all Somewhat difficult       07/02/2024   10:09 AM 05/22/2024    8:45 AM 03/06/2024    8:12 AM  Depression screen PHQ 2/9  Decreased Interest 0 0 0  Down, Depressed, Hopeless 0 0 0  PHQ - 2 Score 0 0 0  Altered sleeping 0 0 0  Tired, decreased energy 0 1 0  Change in appetite 0 0 0  Feeling bad or failure about yourself  0 0 0  Trouble concentrating 0 0 0  Moving slowly or fidgety/restless 0 0 0  Suicidal thoughts 0 0 0  PHQ-9 Score 0 1 0  Difficult doing work/chores Not difficult  at all Not difficult at all Not difficult at all    BP Readings from Last 3 Encounters:  07/15/24 131/83  07/02/24 112/82  06/03/24 125/79    Physical Exam  Wt Readings from Last 3 Encounters:  07/15/24 214 lb 12.8 oz (97.4 kg)  07/02/24 215 lb (97.5 kg)  06/03/24 216 lb 3.2 oz (98.1 kg)    There were no vitals taken for this visit.  Assessment and Plan:  Problem List Items Addressed This Visit   None   No follow-ups on file.    Leita HILARIO Adie, MD [facility] [place]

## 2024-07-15 NOTE — Telephone Encounter (Signed)
 Patient informed.

## 2024-07-15 NOTE — Telephone Encounter (Signed)
 Called and spoke with patient. She said she broke out in a rash on both arms right above elbow/ extreme itching. Looks like HIVES per patient.  Pt needs something different. Please review.

## 2024-07-15 NOTE — Telephone Encounter (Signed)
 Copied from CRM [id number]. Topic: Clinical - Medication Question >> Jul 15, 2024  2:43 PM Delon T wrote: Reason for CRM: methocarbamol (ROBAXIN) 500 MG tablet- had allergic reaction, need something else for muscle spasms- [phone]

## 2024-07-16 ENCOUNTER — Telehealth: Payer: Self-pay

## 2024-07-16 NOTE — Telephone Encounter (Signed)
 Copied from CRM [id number]. Topic: Clinical - Medication Question >> Jul 15, 2024  2:43 PM Delon T wrote: Reason for CRM: methocarbamol (ROBAXIN) 500 MG tablet- had allergic reaction, need something else for muscle spasms- [phone] >> Jul 16, 2024 10:15 AM Winona R wrote: Pt would like to try another medication as [facility] previously declined the medication cyclobenzaprine . Pt taking the medication back to the pharmacy after realizing its the same medication

## 2024-07-16 NOTE — Telephone Encounter (Signed)
 Patient informed. She will try medicine.

## 2024-07-17 ENCOUNTER — Telehealth: Payer: Self-pay | Admitting: Internal Medicine

## 2024-07-17 ENCOUNTER — Other Ambulatory Visit: Payer: Self-pay | Admitting: Internal Medicine

## 2024-07-17 DIAGNOSIS — M62830 Muscle spasm of back: Secondary | ICD-10-CM

## 2024-07-17 MED ORDER — BACLOFEN 10 MG PO TABS: 10.0000 mg | ORAL_TABLET | Freq: Three times a day (TID) | ORAL | 0 refills | Status: DC

## 2024-07-17 NOTE — Progress Notes (Unsigned)
 Date:  07/17/2024   Name:  Christine Ray   DOB:  1956/04/29   MRN:  [id number]   Chief Complaint: No chief complaint on file.  HPI  Review of Systems   Lab Results  Component Value Date   NA 140 03/06/2024   K 4.7 03/06/2024   CO2 26 03/06/2024   GLUCOSE 70 03/06/2024   BUN 15 03/06/2024   CREATININE 0.85 03/06/2024   CALCIUM 9.4 03/06/2024   EGFR 74 03/06/2024   GFRNONAA >60 08/08/2023   Lab Results  Component Value Date   CHOL 192 03/06/2024   HDL 57 03/06/2024   LDLCALC 118 (H) 03/06/2024   TRIG 95 03/06/2024   CHOLHDL 3.4 03/06/2024   Lab Results  Component Value Date   TSH 1.300 03/06/2024   Lab Results  Component Value Date   HGBA1C 5.6 03/06/2024   Lab Results  Component Value Date   WBC 5.5 11/27/2023   HGB 13.6 11/27/2023   HCT 42.3 11/27/2023   MCV 87 11/27/2023   PLT 187 11/27/2023   Lab Results  Component Value Date   ALT 14 03/06/2024   AST 17 03/06/2024   ALKPHOS 80 03/06/2024   BILITOT 0.5 03/06/2024   Lab Results  Component Value Date   VD25OH 34.4 03/06/2024     Patient Active Problem List   Diagnosis Date Noted   Essential hypertension 08/08/2023   Spondylosis without myelopathy or radiculopathy, lumbosacral region 07/27/2023   Arthralgia of right knee 07/27/2023   History of colonic polyps    Polyp of sigmoid colon    Tubular adenoma of colon 10/11/2021   Chronic pain of left knee 04/06/2017   Mixed hyperlipidemia 04/26/2016   Vitamin D  deficiency 04/26/2016   BPPV (benign paroxysmal positional vertigo) 04/26/2016   Varicose veins of both lower extremities 04/26/2016   GERD (gastroesophageal reflux disease) 01/04/2015   Age-related osteoporosis without fracture 01/04/2015    Allergies  Allergen Reactions   Amoxicillin  Hives   Hydrocodone Other (See Comments)    headache   Methocarbamol Hives   Tizanidine Hcl Other (See Comments)    Itching, headache   Oxycodone Hcl Rash    Past Surgical History:   Procedure Laterality Date   COLONOSCOPY WITH PROPOFOL  N/A 11/13/2016   Procedure: COLONOSCOPY WITH PROPOFOL ;  Surgeon: Gladis RAYMOND Mariner, MD;  [facility] ENDOSCOPY;  Service: Endoscopy;  Laterality: N/A;   COLONOSCOPY WITH PROPOFOL  N/A 11/08/2021   Procedure: COLONOSCOPY WITH PROPOFOL ;  Surgeon: Unk Corinn Skiff, MD;  Location: [facility] ENDOSCOPY;  Service: Gastroenterology;  Laterality: N/A;    Social History   Tobacco Use   Smoking status: Former    Current packs/day: 0.00    Average packs/day: 0.3 packs/day for 5.1 years (1.3 ttl pk-yrs)    Types: Cigarettes    Start date: 55    Quit date: 10/14/1975    Years since quitting: 48.7   Smokeless tobacco: Never  Vaping Use   Vaping status: Never Used  Substance Use Topics   Alcohol use: No   Drug use: No     Medication list has been reviewed and updated.  No outpatient medications have been marked as taking for the 07/17/24 encounter (Orders Only) with Justus Leita DEL, MD.       07/02/2024   10:09 AM 03/06/2024    8:12 AM 12/26/2023   11:02 AM 11/27/2023    9:06 AM  GAD 7 : Generalized Anxiety Score  Nervous, Anxious, on Edge 0 0 0  0  Control/stop worrying 0 0 0 0  Worry too much - different things 0 0 0 0  Trouble relaxing 0 0 0 1  Restless 0 0 0 0  Easily annoyed or irritable 0 0 0 0  Afraid - awful might happen 0 0 0 0  Total GAD 7 Score 0 0 0 1  Anxiety Difficulty Not difficult at all Not difficult at all Not difficult at all Somewhat difficult       07/02/2024   10:09 AM 05/22/2024    8:45 AM 03/06/2024    8:12 AM  Depression screen PHQ 2/9  Decreased Interest 0 0 0  Down, Depressed, Hopeless 0 0 0  PHQ - 2 Score 0 0 0  Altered sleeping 0 0 0  Tired, decreased energy 0 1 0  Change in appetite 0 0 0  Feeling bad or failure about yourself  0 0 0  Trouble concentrating 0 0 0  Moving slowly or fidgety/restless 0 0 0  Suicidal thoughts 0 0 0  PHQ-9 Score 0 1 0  Difficult doing work/chores Not difficult  at all Not difficult at all Not difficult at all    BP Readings from Last 3 Encounters:  07/15/24 131/83  07/02/24 112/82  06/03/24 125/79    Physical Exam  Wt Readings from Last 3 Encounters:  07/15/24 214 lb 12.8 oz (97.4 kg)  07/02/24 215 lb (97.5 kg)  06/03/24 216 lb 3.2 oz (98.1 kg)    There were no vitals taken for this visit.  Assessment and Plan:  Problem List Items Addressed This Visit   None   No follow-ups on file.    Leita HILARIO Adie, MD [facility] Primary Care [facility] [place]

## 2024-07-17 NOTE — Telephone Encounter (Signed)
 Copied from CRM [id number]. Topic: Clinical - Prescription Issue >> Jul 16, 2024  5:06 PM Zebedee SAUNDERS wrote: Reason for CRM: Received call from [facility] per Lindajo Gaba ph: [phone] Ext. [phone] regarding cyclobenzaprine  (FLEXERIL ) 5 MG tablet not covered by insurance and not recommended for elderly pt's. They recommend baclofen .

## 2024-07-17 NOTE — Telephone Encounter (Signed)
 Called and left VM informing pt.

## 2024-07-20 ENCOUNTER — Encounter (INDEPENDENT_AMBULATORY_CARE_PROVIDER_SITE_OTHER): Payer: Self-pay | Admitting: Nurse Practitioner

## 2024-07-20 NOTE — Progress Notes (Addendum)
 Subjective:    Patient ID: Christine Ray, female    DOB: 1956/03/06, 68 y.o.   MRN: [id number] Chief Complaint  Patient presents with   Follow-up    6-8 weeks follow up    The patient returns to the office for followup status post laser ablation of the left saphenous vein on 12/07/2023.  The patient note significant improvement in the lower extremity pain but not resolution of the symptoms. The patient notes multiple residual varicosities bilaterally which continued to hurt with dependent positions and remained tender to palpation. The patient's swelling is minimally from preoperative status. The patient continues to wear graduated compression stockings on a daily basis but these are not eliminating the pain and discomfort. The patient continues to use over-the-counter anti-inflammatory medications to treat the pain and related symptoms but this has not given the patient relief. The patient notes the pain in the lower extremities is causing problems with daily exercise, problems at work and even with household activities such as preparing meals and doing dishes.  The patient is otherwise done well and there have been no complications related to the laser procedure or interval changes in the patient's overall   Post laser ultrasound shows successful ablation of the left gsv      Review of Systems  Skin:  Positive for color change.  All other systems reviewed and are negative.      Objective:   Physical Exam Vitals reviewed.  HENT:     Head: Normocephalic.  Cardiovascular:     Rate and Rhythm: Normal rate.     Pulses: Normal pulses.  Pulmonary:     Effort: Pulmonary effort is normal.  Musculoskeletal:        General: Tenderness present.  Skin:    General: Skin is warm and dry.  Neurological:     Mental Status: She is alert and oriented to person, place, and time.  Psychiatric:        Mood and Affect: Mood normal.        Behavior: Behavior normal.        Thought Content:  Thought content normal.        Judgment: Judgment normal.     BP 131/83   Pulse 60   Resp 18   Wt 214 lb 12.8 oz (97.4 kg)   BMI 29.96 kg/m   Past Medical History:  Diagnosis Date   Hyperlipidemia    Hyperlipidemia    Lateral epicondylitis of right elbow 01/04/2015    Social History   Socioeconomic History   Marital status: Married    Spouse name: Tanda   Number of children: 0   Years of education: Not on file   Highest education level: 12th grade  Occupational History   Occupation: retired  Tobacco Use   Smoking status: Former    Current packs/day: 0.00    Average packs/day: 0.3 packs/day for 5.1 years (1.3 ttl pk-yrs)    Types: Cigarettes    Start date: 29    Quit date: 10/14/1975    Years since quitting: 48.8   Smokeless tobacco: Never  Vaping Use   Vaping status: Never Used  Substance and Sexual Activity   Alcohol use: No   Drug use: No   Sexual activity: Not on file  Other Topics Concern   Not on file  Social History Narrative   1 step-son   Social Drivers of Health   Financial Resource Strain: Low Risk  (05/22/2024)   Overall Physicist, Medical Strain (  CARDIA)    Difficulty of Paying Living Expenses: Not hard at all  Food Insecurity: No Food Insecurity (05/22/2024)   Hunger Vital Sign    Worried About Running Out of Food in the Last Year: Never true    Ran Out of Food in the Last Year: Never true  Transportation Needs: No Transportation Needs (05/22/2024)   PRAPARE - Administrator, Civil Service (Medical): No    Lack of Transportation (Non-Medical): No  Physical Activity: Insufficiently Active (05/22/2024)   Exercise Vital Sign    Days of Exercise per Week: 3 days    Minutes of Exercise per Session: 30 min  Stress: No Stress Concern Present (05/22/2024)   [employer] of Occupational Health - Occupational Stress Questionnaire    Feeling of Stress: Not at all  Social Connections: Socially Integrated (05/22/2024)   Social Connection and  Isolation Panel    Frequency of Communication with Friends and Family: More than three times a week    Frequency of Social Gatherings with Friends and Family: Three times a week    Attends Religious Services: More than 4 times per year    Active Member of Clubs or Organizations: Yes    Attends Banker Meetings: More than 4 times per year    Marital Status: Married  Catering Manager Violence: Not At Risk (05/22/2024)   Humiliation, Afraid, Rape, and Kick questionnaire    Fear of Current or Ex-Partner: No    Emotionally Abused: No    Physically Abused: No    Sexually Abused: No    Past Surgical History:  Procedure Laterality Date   COLONOSCOPY WITH PROPOFOL  N/A 11/13/2016   Procedure: COLONOSCOPY WITH PROPOFOL ;  Surgeon: Gladis RAYMOND Mariner, MD;  Location: [facility] ENDOSCOPY;  Service: Endoscopy;  Laterality: N/A;   COLONOSCOPY WITH PROPOFOL  N/A 11/08/2021   Procedure: COLONOSCOPY WITH PROPOFOL ;  Surgeon: Unk Corinn Skiff, MD;  Location: [facility] ENDOSCOPY;  Service: Gastroenterology;  Laterality: N/A;    Family History  Problem Relation Age of Onset   Breast cancer Mother 13   Prostate cancer Father    Throat cancer Brother     Allergies  Allergen Reactions   Amoxicillin  Hives   Hydrocodone Other (See Comments)    headache   Methocarbamol Hives   Tizanidine Hcl Other (See Comments)    Itching, headache   Oxycodone Hcl Rash       Latest Ref Rng & Units 11/27/2023    9:36 AM 02/20/2023   10:31 AM 02/14/2022   10:30 AM  CBC  WBC 3.4 - 10.8 x10E3/uL 5.5  5.3  5.7   Hemoglobin 11.1 - 15.9 g/dL 86.3  86.6  86.4   Hematocrit 34.0 - 46.6 % 42.3  43.0  40.7   Platelets 150 - 450 x10E3/uL 187  176  191       CMP     Component Value Date/Time   NA 140 03/06/2024 0857   K 4.7 03/06/2024 0857   CL 102 03/06/2024 0857   CO2 26 03/06/2024 0857   GLUCOSE 70 03/06/2024 0857   GLUCOSE 85 08/08/2023 0923   BUN 15 03/06/2024 0857   CREATININE 0.85 03/06/2024 0857   CALCIUM  9.4 03/06/2024 0857   PROT 7.0 03/06/2024 0857   ALBUMIN 4.3 03/06/2024 0857   AST 17 03/06/2024 0857   ALT 14 03/06/2024 0857   ALKPHOS 80 03/06/2024 0857   BILITOT 0.5 03/06/2024 0857   EGFR 74 03/06/2024 0857   GFRNONAA >60 08/08/2023  9076     No results found.     Assessment & Plan:   1. Varicose veins of left lower extremity with pain (Primary) Recommend:  The patient has had successful ablation of the previously incompetent saphenous venous system but still has persistent symptoms of pain and swelling that are having a negative impact on daily life and daily activities.CEAP [id number].  Patient should undergo injection sclerotherapy to treat the residual varicosities.  The risks, benefits and alternative therapies were reviewed in detail with the patient.  All questions were answered.  The patient agrees to proceed with sclerotherapy at their convenience.  The patient will continue wearing the graduated compression stockings and using the over-the-counter pain medications to treat her symptoms.      2. Essential hypertension Continue antihypertensive medications as already ordered, these medications have been reviewed and there are no changes at this time.   Current Outpatient Medications on File Prior to Visit  Medication Sig Dispense Refill   amLODipine  (NORVASC ) 2.5 MG tablet Take 1 tablet (2.5 mg total) by mouth daily. 90 tablet 1   Ascorbic Acid (VITAMIN C) 500 MG CAPS Take 1,000 mg by mouth.     aspirin EC 81 MG tablet Take 81 mg by mouth daily as needed (learned that it is good to take everyday).     azelastine (ASTELIN) 0.1 % nasal spray SMARTSIG:1-2 Spray(s) Both Nares Twice Daily     Cholecalciferol (VITAMIN D ) 2000 units tablet Take 2,000 Units by mouth daily.     ibandronate (BONIVA) 150 MG tablet Take 1 tablet by mouth every 30 (thirty) days.     omeprazole  (PRILOSEC) 20 MG capsule Take 1 capsule (20 mg total) by mouth daily. 90 capsule 1   simvastatin   (ZOCOR ) 20 MG tablet Take 1 tablet (20 mg total) by mouth at bedtime. 90 tablet 1   solifenacin (VESICARE) 5 MG tablet Take 5 mg by mouth daily.     timolol (TIMOPTIC) 0.5 % ophthalmic solution Place 1 drop into both eyes daily.     No current facility-administered medications on file prior to visit.    There are no Patient Instructions on file for this visit. No follow-ups on file.   [place] Bellemare E Clarie Camey, NP

## 2024-08-04 ENCOUNTER — Other Ambulatory Visit: Payer: Self-pay | Admitting: Internal Medicine

## 2024-08-04 DIAGNOSIS — E782 Mixed hyperlipidemia: Secondary | ICD-10-CM

## 2024-08-05 NOTE — Telephone Encounter (Signed)
 Requested Prescriptions  Pending Prescriptions Disp Refills   simvastatin  (ZOCOR ) 20 MG tablet [Pharmacy Med Name: SIMVASTATIN  20 MG Oral Tablet] 90 tablet 1    Sig: TAKE 1 TABLET AT BEDTIME (NEW DOSE)     Cardiovascular:  Antilipid - Statins Failed - 08/05/2024  5:29 PM      Failed - Lipid Panel in normal range within the last 12 months    Cholesterol, Total  Date Value Ref Range Status  03/06/2024 192 100 - 199 mg/dL Final   LDL Chol Calc (NIH)  Date Value Ref Range Status  03/06/2024 118 (H) 0 - 99 mg/dL Final   HDL  Date Value Ref Range Status  03/06/2024 57 >39 mg/dL Final   Triglycerides  Date Value Ref Range Status  03/06/2024 95 0 - 149 mg/dL Final         Passed - Patient is not pregnant      Passed - Valid encounter within last 12 months    Recent Outpatient Visits           1 month ago Spondylosis without myelopathy or radiculopathy, lumbosacral region   [facility] Primary Care [facility] Medicine at [facility], Leita DEL, MD   5 months ago Annual physical exam   [facility] Primary Care & Sports Medicine at [facility], Leita DEL, MD   7 months ago Essential hypertension   [place] Primary Care & Sports Medicine at [facility], Leita DEL, MD   8 months ago Essential hypertension   [facility] Primary Care & Sports Medicine at [facility], Leita DEL, MD       Future Appointments             In 1 month Justus, Leita DEL, MD [facility] Primary Care & Sports Medicine at [facility], [phone] [place]

## 2024-08-11 DIAGNOSIS — H5213 Myopia, bilateral: Secondary | ICD-10-CM | POA: Diagnosis not present

## 2024-08-12 ENCOUNTER — Other Ambulatory Visit: Payer: Self-pay | Admitting: Obstetrics and Gynecology

## 2024-08-12 DIAGNOSIS — Z1231 Encounter for screening mammogram for malignant neoplasm of breast: Secondary | ICD-10-CM

## 2024-08-12 DIAGNOSIS — Z01411 Encounter for gynecological examination (general) (routine) with abnormal findings: Secondary | ICD-10-CM | POA: Diagnosis not present

## 2024-08-18 ENCOUNTER — Telehealth (INDEPENDENT_AMBULATORY_CARE_PROVIDER_SITE_OTHER): Payer: Self-pay | Admitting: Nurse Practitioner

## 2024-08-18 DIAGNOSIS — M8588 Other specified disorders of bone density and structure, other site: Secondary | ICD-10-CM | POA: Diagnosis not present

## 2024-08-18 NOTE — Telephone Encounter (Signed)
 Spoke to pt who states that she is with her mother at her appt and will call back later to schedule Sclero. Pt was advised that her shara runs out in January, pt verbalized understanding.   left leg SALINE sclero. see fb. shara #[phone] exp: 08/04/24 - 10/03/23. 2 units approved

## 2024-08-29 ENCOUNTER — Other Ambulatory Visit: Payer: Self-pay | Admitting: Internal Medicine

## 2024-09-01 NOTE — Telephone Encounter (Signed)
 Discontinued 12/26/23.  Requested Prescriptions  Pending Prescriptions Disp Refills   fluticasone  (FLONASE ) 50 MCG/ACT nasal spray [Pharmacy Med Name: FLUTICASONE  PROPIONATE 50 MCG/ACT Nasal Suspension] 48 g 3    Sig: USE 2 SPRAYS IN EACH NOSTRIL EVERY DAY     Ear, Nose, and Throat: Nasal Preparations - Corticosteroids Passed - 09/01/2024  2:36 PM      Passed - Valid encounter within last 12 months    Recent Outpatient Visits           2 months ago Spondylosis without myelopathy or radiculopathy, lumbosacral region   [facility] Care & Sports Medicine at [facility], Leita DEL, MD   5 months ago Annual physical exam   [facility] Care [facility] Medicine at [facility], Leita DEL, MD   8 months ago Essential hypertension   [place] Primary Care & Sports Medicine at [facility], Leita DEL, MD   9 months ago Essential hypertension   [facility] Primary Care & Sports Medicine at [facility], Leita DEL, MD

## 2024-09-02 ENCOUNTER — Other Ambulatory Visit: Payer: Self-pay

## 2024-09-02 ENCOUNTER — Ambulatory Visit (INDEPENDENT_AMBULATORY_CARE_PROVIDER_SITE_OTHER): Admitting: Nurse Practitioner

## 2024-09-02 DIAGNOSIS — M62830 Muscle spasm of back: Secondary | ICD-10-CM

## 2024-09-02 MED ORDER — BACLOFEN 10 MG PO TABS: 10.0000 mg | ORAL_TABLET | Freq: Three times a day (TID) | ORAL | 0 refills | Status: AC

## 2024-09-05 ENCOUNTER — Ambulatory Visit: Admitting: Internal Medicine

## 2024-09-16 ENCOUNTER — Other Ambulatory Visit: Payer: Self-pay | Admitting: Internal Medicine

## 2024-09-16 DIAGNOSIS — M62830 Muscle spasm of back: Secondary | ICD-10-CM

## 2024-09-17 NOTE — Telephone Encounter (Signed)
 Requested medication (s) are due for refill today: yes  Requested medication (s) are on the active medication list: yes  Last refill:  09/02/24  Future visit scheduled no  Notes to clinic:  lasted refilled for short supply, should patient continue to take?     Requested Prescriptions  Pending Prescriptions Disp Refills   baclofen  (LIORESAL ) 10 MG tablet [Pharmacy Med Name: BACLOFEN  10 MG Oral Tablet] 30 tablet     Sig: TAKE 1 TABLET THREE TIMES DAILY     Analgesics:  Muscle Relaxants - baclofen  Failed - 09/17/2024  2:44 PM      Failed - Cr in normal range and within 180 days    Creatinine, Ser  Date Value Ref Range Status  03/06/2024 0.85 0.57 - 1.00 mg/dL Final         Failed - eGFR is 30 or above and within 180 days    GFR calc Af Amer  Date Value Ref Range Status  11/30/2019 >60 >60 mL/min Final   GFR, Estimated  Date Value Ref Range Status  08/08/2023 >60 >60 mL/min Final    Comment:    (NOTE) Calculated using the CKD-EPI Creatinine Equation (2021)    eGFR  Date Value Ref Range Status  03/06/2024 74 >59 mL/min/1.73 Final         Passed - Valid encounter within last 6 months    Recent Outpatient Visits           2 months ago Spondylosis without myelopathy or radiculopathy, lumbosacral region   [facility] Primary Care & Sports Medicine at [facility], Leita DEL, MD   6 months ago Annual physical exam   [facility] at [facility], Leita DEL, MD   8 months ago Essential hypertension   [facility] Primary Care & Sports Medicine at [facility], Leita DEL, MD   9 months ago Essential hypertension   [facility] Primary Care & Sports Medicine at [facility], Leita DEL, MD

## 2024-09-30 ENCOUNTER — Ambulatory Visit (INDEPENDENT_AMBULATORY_CARE_PROVIDER_SITE_OTHER): Admitting: Nurse Practitioner

## 2024-10-06 ENCOUNTER — Ambulatory Visit
Admission: RE | Admit: 2024-10-06 | Discharge: 2024-10-06 | Disposition: A | Source: Ambulatory Visit | Attending: Obstetrics and Gynecology | Admitting: Obstetrics and Gynecology

## 2024-10-06 DIAGNOSIS — Z1231 Encounter for screening mammogram for malignant neoplasm of breast: Secondary | ICD-10-CM | POA: Diagnosis present

## 2024-10-09 ENCOUNTER — Ambulatory Visit (INDEPENDENT_AMBULATORY_CARE_PROVIDER_SITE_OTHER): Admitting: Nurse Practitioner

## 2024-10-09 ENCOUNTER — Encounter (INDEPENDENT_AMBULATORY_CARE_PROVIDER_SITE_OTHER): Payer: Self-pay | Admitting: Nurse Practitioner

## 2024-10-09 VITALS — BP 115/72 | HR 67 | Resp 18 | Wt 212.0 lb

## 2024-10-09 DIAGNOSIS — I83813 Varicose veins of bilateral lower extremities with pain: Secondary | ICD-10-CM | POA: Diagnosis not present

## 2024-10-12 ENCOUNTER — Encounter (INDEPENDENT_AMBULATORY_CARE_PROVIDER_SITE_OTHER): Payer: Self-pay | Admitting: Nurse Practitioner

## 2024-10-12 NOTE — Progress Notes (Signed)
Varicose veins of left lower extremity with inflammation (454.1  I83.10) Current Plans   Indication: Patient presents with symptomatic varicose veins of the left lower extremity.   Procedure: Sclerotherapy using hypertonic saline mixed with 1% Lidocaine was performed on the left lower extremity. Compression wraps were placed. The patient tolerated the procedure well. 

## 2024-11-10 ENCOUNTER — Ambulatory Visit (INDEPENDENT_AMBULATORY_CARE_PROVIDER_SITE_OTHER): Admitting: Nurse Practitioner

## 2025-05-27 ENCOUNTER — Ambulatory Visit
